# Patient Record
Sex: Female | Born: 1990 | Race: White | Hispanic: No | Marital: Married | Smoking: Former smoker
Health system: Southern US, Community
[De-identification: ages and names within clinical notes are randomized; demographics above are authoritative.]

## PROBLEM LIST (undated history)

## (undated) DIAGNOSIS — J309 Allergic rhinitis, unspecified: Secondary | ICD-10-CM

## (undated) DIAGNOSIS — M549 Dorsalgia, unspecified: Secondary | ICD-10-CM

## (undated) DIAGNOSIS — Q7292 Unspecified reduction defect of left lower limb: Secondary | ICD-10-CM

## (undated) DIAGNOSIS — F4312 Post-traumatic stress disorder, chronic: Secondary | ICD-10-CM

## (undated) DIAGNOSIS — B009 Herpesviral infection, unspecified: Secondary | ICD-10-CM

## (undated) HISTORY — DX: Allergic rhinitis, unspecified: J30.9

## (undated) HISTORY — DX: Post-traumatic stress disorder, chronic: F43.12

## (undated) HISTORY — PX: WISDOM TOOTH EXTRACTION: SHX21

## (undated) HISTORY — DX: Unspecified reduction defect of left lower limb: Q72.92

## (undated) HISTORY — DX: Herpesviral infection, unspecified: B00.9

## (undated) HISTORY — PX: NO PAST SURGERIES: SHX2092

## (undated) HISTORY — DX: Dorsalgia, unspecified: M54.9

---

## 2014-05-06 ENCOUNTER — Inpatient Hospital Stay: Payer: Self-pay | Admitting: Obstetrics and Gynecology

## 2014-05-06 LAB — CBC WITH DIFFERENTIAL/PLATELET
BASOS PCT: 0.1 %
Basophil #: 0 10*3/uL (ref 0.0–0.1)
EOS ABS: 0.1 10*3/uL (ref 0.0–0.7)
Eosinophil %: 0.6 %
HCT: 33.5 % — ABNORMAL LOW (ref 35.0–47.0)
HGB: 10.8 g/dL — ABNORMAL LOW (ref 12.0–16.0)
LYMPHS ABS: 2.2 10*3/uL (ref 1.0–3.6)
Lymphocyte %: 17.1 %
MCH: 28.7 pg (ref 26.0–34.0)
MCHC: 32.3 g/dL (ref 32.0–36.0)
MCV: 89 fL (ref 80–100)
MONO ABS: 0.7 x10 3/mm (ref 0.2–0.9)
MONOS PCT: 5.7 %
Neutrophil #: 9.7 10*3/uL — ABNORMAL HIGH (ref 1.4–6.5)
Neutrophil %: 76.5 %
PLATELETS: 171 10*3/uL (ref 150–440)
RBC: 3.77 10*6/uL — AB (ref 3.80–5.20)
RDW: 14.4 % (ref 11.5–14.5)
WBC: 12.6 10*3/uL — AB (ref 3.6–11.0)

## 2014-05-06 LAB — DRUG SCREEN, URINE

## 2014-05-06 LAB — GC/CHLAMYDIA PROBE AMP

## 2014-05-08 LAB — HEMATOCRIT: HCT: 26 % — ABNORMAL LOW (ref 35.0–47.0)

## 2014-06-22 ENCOUNTER — Ambulatory Visit: Payer: Self-pay | Admitting: Pain Medicine

## 2014-07-07 ENCOUNTER — Ambulatory Visit: Payer: Self-pay | Admitting: Pain Medicine

## 2014-08-01 ENCOUNTER — Ambulatory Visit: Payer: Self-pay | Admitting: Pain Medicine

## 2015-02-06 ENCOUNTER — Ambulatory Visit
Admit: 2015-02-06 | Disposition: A | Discharge status: Home / Self Care | Payer: Self-pay | Attending: Family Medicine | Admitting: Family Medicine

## 2015-02-14 DIAGNOSIS — J309 Allergic rhinitis, unspecified: Secondary | ICD-10-CM | POA: Insufficient documentation

## 2015-02-14 DIAGNOSIS — F4312 Post-traumatic stress disorder, chronic: Secondary | ICD-10-CM | POA: Insufficient documentation

## 2015-02-14 DIAGNOSIS — B009 Herpesviral infection, unspecified: Secondary | ICD-10-CM | POA: Insufficient documentation

## 2015-06-11 ENCOUNTER — Encounter: Payer: Self-pay | Admitting: Family Medicine

## 2015-06-11 ENCOUNTER — Ambulatory Visit (INDEPENDENT_AMBULATORY_CARE_PROVIDER_SITE_OTHER): Payer: BLUE CROSS/BLUE SHIELD | Admitting: Family Medicine

## 2015-06-11 VITALS — BP 112/78 | HR 63 | Temp 99.6°F | Wt 265.2 lb

## 2015-06-11 DIAGNOSIS — M545 Low back pain, unspecified: Secondary | ICD-10-CM | POA: Insufficient documentation

## 2015-06-11 DIAGNOSIS — F4312 Post-traumatic stress disorder, chronic: Secondary | ICD-10-CM

## 2015-06-11 DIAGNOSIS — M9903 Segmental and somatic dysfunction of lumbar region: Secondary | ICD-10-CM | POA: Diagnosis not present

## 2015-06-11 DIAGNOSIS — M9902 Segmental and somatic dysfunction of thoracic region: Secondary | ICD-10-CM | POA: Diagnosis not present

## 2015-06-11 DIAGNOSIS — M9905 Segmental and somatic dysfunction of pelvic region: Secondary | ICD-10-CM

## 2015-06-11 DIAGNOSIS — M9904 Segmental and somatic dysfunction of sacral region: Secondary | ICD-10-CM

## 2015-06-11 DIAGNOSIS — M9901 Segmental and somatic dysfunction of cervical region: Secondary | ICD-10-CM | POA: Diagnosis not present

## 2015-06-11 DIAGNOSIS — M2170 Unequal limb length (acquired), unspecified site: Secondary | ICD-10-CM | POA: Diagnosis not present

## 2015-06-11 MED ORDER — BUPROPION HCL ER (SR) 150 MG PO TB12: ORAL_TABLET | ORAL | Status: DC

## 2015-06-11 MED ORDER — CLONAZEPAM 0.5 MG PO TABS: 0.5000 mg | ORAL_TABLET | Freq: Every day | ORAL | Status: DC | PRN

## 2015-06-11 NOTE — Assessment & Plan Note (Signed)
Not doing well with heel cup. Will start heel lift. 3mm today. Will increase as needed for comfort and decreased pain.

## 2015-06-11 NOTE — Assessment & Plan Note (Signed)
Patient seems to have leg-length discrepancy that is bothering her again due to increased standing for work. Will start 3mm heel lift, which I think would help significantly. The rest of her pain seems to be due to functional and mechanical changes. I think that she would benefit from OMT. She was treated today as discussed below.

## 2015-06-11 NOTE — Assessment & Plan Note (Signed)
Doing better on the  of celexa, but still not stable. Will refill her klonapin. Will start her on wellbutrin for adjuvant treatment. Discussed risks and benefits. Follow up in 1 month. Continue celexa.

## 2015-06-11 NOTE — Patient Instructions (Signed)
Bupropion sustained-release tablets (Depression/Mood Disorders)  What is this medicine?  BUPROPION (byoo PROE pee on) is used to treat depression.  This medicine may be used for other purposes; ask your health care provider or pharmacist if you have questions.  COMMON BRAND NAME(S): Budeprion SR, Wellbutrin SR  What should I tell my health care provider before I take this medicine?  They need to know if you have any of these conditions:  -an eating disorder, such as anorexia or bulimia  -bipolar disorder or psychosis  -diabetes or high blood sugar, treated with medication  -glaucoma  -head injury or brain tumor  -heart disease, previous heart attack, or irregular heart beat  -high blood pressure  -kidney or liver disease  -seizures  -suicidal thoughts or a previous suicide attempt  -Tourette's syndrome  -weight loss  -an unusual or allergic reaction to bupropion, other medicines, foods, dyes, or preservatives  -breast-feeding  -pregnant or trying to become pregnant  How should I use this medicine?  Take this medicine by mouth with a glass of water. Follow the directions on the prescription label. You can take it with or without food. If it upsets your stomach, take it with food. Do not cut, crush or chew this medicine. Take your medicine at regular intervals. If you take this medicine more than once a day, take your second dose at least 8 hours after you take your first dose. To limit difficulty in sleeping, avoid taking this medicine at bedtime. Do not take your medicine more often than directed. Do not stop taking this medicine suddenly except upon the advice of your doctor. Stopping this medicine too quickly may cause serious side effects or your condition may worsen.  A special MedGuide will be given to you by the pharmacist with each prescription and refill. Be sure to read this information carefully each time.  Talk to your pediatrician regarding the use of this medicine in children. Special care may be  needed.  Overdosage: If you think you have taken too much of this medicine contact a poison control center or emergency room at once.  NOTE: This medicine is only for you. Do not share this medicine with others.  What if I miss a dose?  If you miss a dose, skip the missed dose and take your next tablet at the regular time. There should be at least 8 hours between doses. Do not take double or extra doses.  What may interact with this medicine?  Do not take this medicine with any of the following medications:  -linezolid  -MAOIs like Azilect, Carbex, Eldepryl, Marplan, Nardil, and Parnate  -methylene blue (injected into a vein)  -other medicines that contain bupropion like Zyban  This medicine may also interact with the following medications:  -alcohol  -certain medicines for anxiety or sleep  -certain medicines for blood pressure like metoprolol, propranolol  -certain medicines for depression or psychotic disturbances  -certain medicines for HIV or AIDS like efavirenz, lopinavir, nelfinavir, ritonavir  -certain medicines for irregular heart beat like propafenone, flecainide  -certain medicines for Parkinson's disease like amantadine, levodopa  -certain medicines for seizures like carbamazepine, phenytoin, phenobarbital  -cimetidine  -clopidogrel  -cyclophosphamide  -furazolidone  -isoniazid  -nicotine  -orphenadrine  -procarbazine  -steroid medicines like prednisone or cortisone  -stimulant medicines for attention disorders, weight loss, or to stay awake  -tamoxifen  -theophylline  -thiotepa  -ticlopidine  -tramadol  -warfarin  This list may not describe all possible interactions. Give your health care   provider a list of all the medicines, herbs, non-prescription drugs, or dietary supplements you use. Also tell them if you smoke, drink alcohol, or use illegal drugs. Some items may interact with your medicine.  What should I watch for while using this medicine?  Tell your doctor if your symptoms do not get better or  if they get worse. Visit your doctor or health care professional for regular checks on your progress. Because it may take several weeks to see the full effects of this medicine, it is important to continue your treatment as prescribed by your doctor.  Patients and their families should watch out for new or worsening thoughts of suicide or depression. Also watch out for sudden changes in feelings such as feeling anxious, agitated, panicky, irritable, hostile, aggressive, impulsive, severely restless, overly excited and hyperactive, or not being able to sleep. If this happens, especially at the beginning of treatment or after a change in dose, call your health care professional.  Avoid alcoholic drinks while taking this medicine. Drinking excessive alcoholic beverages, using sleeping or anxiety medicines, or quickly stopping the use of these agents while taking this medicine may increase your risk for a seizure.  Do not drive or use heavy machinery until you know how this medicine affects you. This medicine can impair your ability to perform these tasks.  Do not take this medicine close to bedtime. It may prevent you from sleeping.  Your mouth may get dry. Chewing sugarless gum or sucking hard candy, and drinking plenty of water may help. Contact your doctor if the problem does not go away or is severe.  What side effects may I notice from receiving this medicine?  Side effects that you should report to your doctor or health care professional as soon as possible:  -allergic reactions like skin rash, itching or hives, swelling of the face, lips, or tongue  -breathing problems  -changes in vision  -confusion  -fast or irregular heartbeat  -hallucinations  -increased blood pressure  -redness, blistering, peeling or loosening of the skin, including inside the mouth  -seizures  -suicidal thoughts or other mood changes  -unusually weak or tired  -vomiting  Side effects that usually do not require medical attention (report  to your doctor or health care professional if they continue or are bothersome):  -change in sex drive or performance  -constipation  -headache  -loss of appetite  -nausea  -tremors  -weight loss  This list may not describe all possible side effects. Call your doctor for medical advice about side effects. You may report side effects to FDA at 1-800-FDA-1088.  Where should I keep my medicine?  Keep out of the reach of children.  Store at room temperature between 20 and 25 degrees C (68 and 77 degrees F), away from direct sunlight and moisture. Keep tightly closed. Throw away any unused medicine after the expiration date.  NOTE: This sheet is a summary. It may not cover all possible information. If you have questions about this medicine, talk to your doctor, pharmacist, or health care provider.  © 2015, [employer]. (2013-05-06 12:41:10)

## 2015-06-11 NOTE — Progress Notes (Signed)
BP 112/78 mmHg  Pulse 63  Temp(Src) 99.6 F (37.6 C)  Wt 265 lb 3.2 oz (120.294 kg)  SpO2 98%   Subjective:    Patient ID: Alexa Diaz, female    DOB: 04/06/1991, 24 y.o.   MRN: [id number]  HPI: Alexa Diaz is a 24 y.o. female  Chief Complaint  Patient presents with  . Anxiety  . Back Pain   ANXIETY/STRESS- feels better on the  of celexa, but still not stable. Would like to se if there is something else we can do. Taking Klonopin very sparingly. Duration:stable Anxious mood: yes  Excessive worrying: yes Irritability: yes  Sweating: no Nausea: no Palpitations:yes Hyperventilation: no Panic attacks: yes Agoraphobia: no  Obscessions/compulsions: no Depressed mood: no GAD7: 9 Anhedonia: no Weight changes: no Insomnia: no   Hypersomnia: no Fatigue/loss of energy: no Feelings of worthlessness: no Feelings of guilt: no Impaired concentration/indecisiveness: yes Suicidal ideations: no   BACK PAIN- did well following her last appointment, but it has been 3-4 months. Feels like the heel cup isn't working as well any more. Lots of tenderness in her back. OMT helped previously and she would like to continue Duration: months Mechanism of injury: child birth Location: bilateral and low back Onset: sudden Severity: moderate Quality: dull, aching and pulling Frequency: intermittent Radiation: none Aggravating factors: lifting, movement, walking, bending and prolonged sitting Alleviating factors: rest, ice, heat, laying, NSAIDs and APAP Status: worse Treatments attempted: rest, ice, heat, APAP, ibuprofen, aleve, physical therapy, HEP and OMM  Relief with NSAIDs?: moderate Nighttime pain:  no Paresthesias / decreased sensation:  no Bowel / bladder incontinence:  no Fevers:  no Dysuria / urinary frequency:  no  Relevant past medical, surgical, family and social history reviewed and updated as indicated. Interim medical history since  our last visit reviewed. Allergies and medications reviewed and updated.  Review of Systems  Constitutional: Negative.   Respiratory: Negative.   Cardiovascular: Negative.   Gastrointestinal: Negative.   Musculoskeletal: Positive for back pain and gait problem. Negative for myalgias, joint swelling, arthralgias, neck pain and neck stiffness.  Psychiatric/Behavioral: Negative.    Per HPI unless specifically indicated above     Objective:    BP 112/78 mmHg  Pulse 63  Temp(Src) 99.6 F (37.6 C)  Wt 265 lb 3.2 oz (120.294 kg)  SpO2 98%  Wt Readings from Last 3 Encounters:  06/11/15 265 lb 3.2 oz (120.294 kg)  01/29/15 262 lb (118.842 kg)    Physical Exam  Constitutional: She is oriented to person, place, and time. She appears well-developed and well-nourished. No distress.  HENT:  Head: Normocephalic and atraumatic.  Right Ear: Hearing normal.  Left Ear: Hearing normal.  Nose: Nose normal.  Eyes: Conjunctivae and lids are normal. Right eye exhibits no discharge. Left eye exhibits no discharge. No scleral icterus.  Cardiovascular: Regular rhythm.   Pulmonary/Chest: Effort normal. No respiratory distress.  Abdominal: Soft. She exhibits no distension and no mass. There is no tenderness. There is no rebound and no guarding.  Musculoskeletal: She exhibits tenderness. She exhibits no edema.  Neurological: She is alert and oriented to person, place, and time.  Skin: Skin is warm, dry and intact. No rash noted. No erythema. No pallor.  Psychiatric: She has a normal mood and affect. Her speech is normal and behavior is normal. Judgment and thought content normal. Cognition and memory are normal.  Musculoskeletal:  Exam found Decreased ROM, Tissue texture changes, Tenderness to palpation and Asymmetry of  patient's  neck, thorax, lumbar, pelvis and sacrum, short L leg Osteopathic Structural Exam:   Neck: C4ESRR, C6ESRL  Thorax: T6-9SRRL, T5ESRR  Lumbar: QL hypertonic on the R,  L5ESRL  Pelvis: Posterior R innominate  Sacrum: R on R torsion, SI joint restricted on the R       Assessment & Plan:   Problem List Items Addressed This Visit      Other   Chronic post-traumatic stress disorder (PTSD)    Doing better on the 60mg  of celexa, but still not stable. Will refill her klonapin. Will start her on wellbutrin for adjuvant treatment. Discussed risks and benefits. Follow up in 1 month. Continue celexa.      Leg length discrepancy    Not doing well with heel cup. Will start heel lift. 3mm today. Will increase as needed for comfort and decreased pain.      Low back pain - Primary    Patient seems to have leg-length discrepancy that is bothering her again due to increased standing for work. Will start 3mm heel lift, which I think would help significantly. The rest of her pain seems to be due to functional and mechanical changes. I think that she would benefit from OMT. She was treated today as discussed below.       Other Visit Diagnoses    Somatic dysfunction of spine, cervical        Thoracic region somatic dysfunction        Lumbar region somatic dysfunction        Sacral region somatic dysfunction        Pelvic region somatic dysfunction          After verbal consent was obtained, patient was treated today with osteopathic manipulative medicine to the regions of the neck, thorax, lumbar, pelvis and sacrum using the techniques of myofascial release, counterstrain, muscle energy, HVLA and soft tissue. Areas of compensation relating to her primary pain source also treated. Patient tolerated the procedure well with good objective and good subjective improvement in symptoms.  She left the room in good condition. She was advised to stay well hydrated and that she may have some soreness following the procedure. If not improving or worsening, she will call and come in. She will return for reevaluation  In 3-4 weeks.   Follow up plan: Return in about 4 weeks (around  07/09/2015).

## 2015-07-10 ENCOUNTER — Encounter: Payer: Self-pay | Admitting: Family Medicine

## 2015-07-10 ENCOUNTER — Ambulatory Visit (INDEPENDENT_AMBULATORY_CARE_PROVIDER_SITE_OTHER): Payer: BLUE CROSS/BLUE SHIELD | Admitting: Family Medicine

## 2015-07-10 VITALS — BP 116/83 | HR 69 | Temp 99.3°F | Wt 262.0 lb

## 2015-07-10 DIAGNOSIS — M2170 Unequal limb length (acquired), unspecified site: Secondary | ICD-10-CM

## 2015-07-10 DIAGNOSIS — M545 Low back pain, unspecified: Secondary | ICD-10-CM

## 2015-07-10 DIAGNOSIS — L509 Urticaria, unspecified: Secondary | ICD-10-CM | POA: Insufficient documentation

## 2015-07-10 DIAGNOSIS — M9905 Segmental and somatic dysfunction of pelvic region: Secondary | ICD-10-CM

## 2015-07-10 DIAGNOSIS — M9902 Segmental and somatic dysfunction of thoracic region: Secondary | ICD-10-CM | POA: Diagnosis not present

## 2015-07-10 DIAGNOSIS — M9903 Segmental and somatic dysfunction of lumbar region: Secondary | ICD-10-CM

## 2015-07-10 DIAGNOSIS — M9901 Segmental and somatic dysfunction of cervical region: Secondary | ICD-10-CM

## 2015-07-10 DIAGNOSIS — M9904 Segmental and somatic dysfunction of sacral region: Secondary | ICD-10-CM

## 2015-07-10 MED ORDER — TRIAMCINOLONE ACETONIDE 0.1 % EX CREA: 1.0000 "application " | TOPICAL_CREAM | Freq: Two times a day (BID) | CUTANEOUS | Status: DC

## 2015-07-10 NOTE — Assessment & Plan Note (Signed)
Patient seems to have leg-length discrepancy that is bothering her again due to increased standing for work. Has been doing well with her 3mm heel lift, which we will continue. The rest of her pain seems to be due to functional and mechanical changes. I think that she would benefit from OMT. She was treated today as discussed below.

## 2015-07-10 NOTE — Progress Notes (Signed)
BP 116/83 mmHg  Pulse 69  Temp(Src) 99.3 F (37.4 C)  Wt 262 lb (118.842 kg)  SpO2 99%   Subjective:    Patient ID: Alexa Diaz, female    DOB: [birth year], 24 y.o.   MRN: [id number]  HPI: Alexa Diaz is a 24 y.o. female  Chief Complaint  Patient presents with  . Back Pain  . Rash    Patient woke up Sunday with a rash, it was very itchy, it was welps. She states that her hands swole up and were very painful, she was told by the pharmactist to use hydrocortisone cream and antifungal cream which helped with the itching, she was also taking benadryl. She denies eating or coming in contact with anything new. She still has a few places on her legs, she did have it under her arms, under her breast , neck, hands and legs   RASH- so much better now, just has some stuff on her thighs, but better now than she was Duration:  days  Location: generalized  Itching: yes Burning: yes Redness: yes Oozing: no Scaling: yes Blisters: no Painful: no Fevers: no Change in detergents/soaps/personal care products: no Recent illness: no Recent travel:no History of same: no Context: better Alleviating factors: hydrocortisone cream, benadryl and lotion/moisturizer Treatments attempted:hydrocortisone cream, benadryl, OTC anit-fungal and lotion/moisturizer Shortness of breath: no  Throat/tongue swelling: no Myalgias/arthralgias: no  BACK PAIN- does really well with OMT, felt well for about 3-4 weeks, but now is noticing that things are getting tight and irritated again.  Duration: months Mechanism of injury: unknown Location: low back Onset: gradual Severity: moderate Quality: aching Frequency: intermittent Radiation: buttocks Aggravating factors: lifting, movement, walking, laying and bending Alleviating factors: OMT, rest, ice, heat and laying Status: fluctuating Treatments attempted: rest, ice, heat, APAP, ibuprofen, aleve, physical therapy, HEP and OMM   Relief with NSAIDs?: yes Nighttime pain:  no Paresthesias / decreased sensation:  no Bowel / bladder incontinence:  no Fevers:  no Dysuria / urinary frequency:  no   Relevant past medical, surgical, family and social history reviewed and updated as indicated. Interim medical history since our last visit reviewed. Allergies and medications reviewed and updated.  Review of Systems  Constitutional: Negative.   Respiratory: Negative.   Cardiovascular: Negative.   Musculoskeletal: Positive for myalgias, back pain, arthralgias and gait problem. Negative for joint swelling, neck pain and neck stiffness.  Psychiatric/Behavioral: Negative.     Per HPI unless specifically indicated above     Objective:    BP 116/83 mmHg  Pulse 69  Temp(Src) 99.3 F (37.4 C)  Wt 262 lb (118.842 kg)  SpO2 99%  Wt Readings from Last 3 Encounters:  07/10/15 262 lb (118.842 kg)  06/11/15 265 lb 3.2 oz (120.294 kg)  01/29/15 262 lb (118.842 kg)    Physical Exam  Constitutional: She is oriented to person, place, and time. She appears well-developed and well-nourished. No distress.  HENT:  Head: Normocephalic and atraumatic.  Right Ear: Hearing normal.  Left Ear: Hearing normal.  Nose: Nose normal.  Eyes: Conjunctivae and lids are normal. Right eye exhibits no discharge. Left eye exhibits no discharge. No scleral icterus.  Cardiovascular: Normal rate and regular rhythm.   Pulmonary/Chest: Effort normal and breath sounds normal. No respiratory distress.  Musculoskeletal: Normal range of motion.  Neurological: She is alert and oriented to person, place, and time.  Skin: Skin is warm, dry and intact. No rash noted. No erythema. No pallor.  Psychiatric: She  has a normal mood and affect. Her speech is normal and behavior is normal. Judgment and thought content normal. Cognition and memory are normal.  Nursing note and vitals reviewed. Musculoskeletal:  Exam found Decreased ROM, Tissue texture changes,  Tenderness to palpation and Asymmetry of patient's  neck, thorax, lumbar, pelvis and sacrum Osteopathic Structural Exam:   Neck: C4-6ESRR, C3ESRL  Thorax: T3-5SRRL, T6-8SLRR  Lumbar: L3-5SRRL  Pelvis: Posterior L torsion  Sacrum: R on R torsion, SI joint restricted on the R   Results for orders placed or performed in visit on 05/06/14  GC/Chlamydia Probe Amp  Result Value Ref Range   Micro Text Report         SOURCE: 1    CHLAMYDIA                 CHLAMYDIA TRACHOMATIS NEGATIVE   N.GONORRHOEAE             N.GONORRHOEAE NEGATIVE   ANTIBIOTIC                                                      CBC with Differential/Platelet  Result Value Ref Range   WBC 12.6 (H) 3.6-11.0 x10 3/mm 3   RBC 3.77 (L) 3.80-5.20 X10 6/mm 3   HGB 10.8 (L) 12.0-16.0 g/dL   HCT 16.1 (L) 09.6-04.5 %   MCV 89 80-100 fL   MCH 28.7 26.0-34.0 pg   MCHC 32.3 32.0-36.0 g/dL   RDW 40.9 81.1-91.4 %   Platelet 171 150-440 x10 3/mm 3   Neutrophil % 76.5 %   Lymphocyte % 17.1 %   Monocyte % 5.7 %   Eosinophil % 0.6 %   Basophil % 0.1 %   Neutrophil # 9.7 (H) 1.4-6.5 x10 3/mm 3   Lymphocyte # 2.2 1.0-3.6 x10 3/mm 3   Monocyte # 0.7 0.2-0.9 x10 3/mm    Eosinophil # 0.1 0.0-0.7 x10 3/mm 3   Basophil # 0.0 0.0-0.1 x10 3/mm 3  Drug Screen, Urine  Result Value Ref Range   Tricyclic, Ur Screen NEGATIVE Cutoff-1000 ng/mL   Amphetamines, Ur Screen NEGATIVE Cutoff-1000 ng/mL   MDMA (Ecstasy)Ur Screen NEGATIVE Cutoff-500 ng/mL   Cocaine Metabolite,Ur [place] NEGATIVE Cutoff-300 ng/mL   Opiate, Ur Screen NEGATIVE Cutoff-300 ng/mL   Phencyclidine (PCP) Ur S NEGATIVE Cutoff-25 ng/mL   Cannabinoid 50 Ng, Ur [place] NEGATIVE Cutoff-50 ng/mL   Barbiturates, Ur Screen NEGATIVE Cutoff-200 ng/mL   Methadone, Ur Screen NEGATIVE Cutoff-300 ng/mL   Benzodiazepine, Ur Scrn NEGATIVE Cutoff-200 ng/mL  Hematocrit  Result Value Ref Range   HCT 26.0 (L) 35.0-47.0 %      Assessment & Plan:   Problem List Items Addressed This Visit       Musculoskeletal and Integument   Urticaria     Other   Leg length discrepancy    Not doing well with heel cup. Will start heel lift. 3mm today. Will increase as needed for comfort and decreased pain.        Low back pain - Primary    Patient seems to have leg-length discrepancy that is bothering her again due to increased standing for work. Has been doing well with her 3mm heel lift, which we will continue. The rest of her pain seems to be due to functional and mechanical changes. I think that she would benefit from  OMT. She was treated today as discussed below.         Other Visit Diagnoses    Somatic dysfunction of spine, cervical        Thoracic region somatic dysfunction        Lumbar region somatic dysfunction        Sacral region somatic dysfunction        Pelvic region somatic dysfunction          After verbal consent was obtained, patient was treated today with osteopathic manipulative medicine to the regions of the neck, thorax, lumbar, pelvis and sacrum using the techniques of Still, muscle energy, HVLA and soft tissue. Areas of compensation relating to her primary pain source also treated. Patient tolerated the procedure well with good objective and good subjective improvement in symptoms.  She left the room in good condition. She was advised to stay well hydrated and that she may have some soreness following the procedure. If not improving or worsening, she will call and come in. She will come in  in 1-2 months if needed.   Follow up plan: Return in about 4 weeks (around 08/07/2015).

## 2015-07-10 NOTE — Assessment & Plan Note (Signed)
Not doing well with heel cup. Will start heel lift. 3mm today. Will increase as needed for comfort and decreased pain.

## 2015-08-07 ENCOUNTER — Encounter: Payer: Self-pay | Admitting: Family Medicine

## 2015-08-07 ENCOUNTER — Ambulatory Visit (INDEPENDENT_AMBULATORY_CARE_PROVIDER_SITE_OTHER): Payer: BLUE CROSS/BLUE SHIELD | Admitting: Family Medicine

## 2015-08-07 VITALS — BP 104/73 | HR 81 | Temp 99.1°F | Wt 267.0 lb

## 2015-08-07 DIAGNOSIS — M9902 Segmental and somatic dysfunction of thoracic region: Secondary | ICD-10-CM

## 2015-08-07 DIAGNOSIS — M9904 Segmental and somatic dysfunction of sacral region: Secondary | ICD-10-CM

## 2015-08-07 DIAGNOSIS — M9905 Segmental and somatic dysfunction of pelvic region: Secondary | ICD-10-CM | POA: Diagnosis not present

## 2015-08-07 DIAGNOSIS — M9903 Segmental and somatic dysfunction of lumbar region: Secondary | ICD-10-CM

## 2015-08-07 DIAGNOSIS — M545 Low back pain, unspecified: Secondary | ICD-10-CM

## 2015-08-07 DIAGNOSIS — M9901 Segmental and somatic dysfunction of cervical region: Secondary | ICD-10-CM

## 2015-08-07 DIAGNOSIS — M2170 Unequal limb length (acquired), unspecified site: Secondary | ICD-10-CM

## 2015-08-07 NOTE — Assessment & Plan Note (Addendum)
In acute exacerbation today due to dancing at a concert. This seems to be myofascial and structural in nature and acute. I think she would benefit from OMT. Treated today as discussed below.

## 2015-08-07 NOTE — Progress Notes (Signed)
BP 104/73 mmHg  Pulse 81  Temp(Src) 99.1 F (37.3 C)  Wt 267 lb (121.11 kg)  SpO2 98%   Subjective:    Patient ID: Alexa Diaz, female    DOB: Feb 24, 1991, 24 y.o.   MRN: [id number]  HPI: Alexa Diaz is a 24 y.o. female  Chief Complaint  Patient presents with  . Tailbone Pain    OMM   Having pain in her back today. She has gotten used to her heel lift and is feeling better with it now and feeling like it has been helping with her back. She was feeling well with her back until about 2 weeks ago when she went to a concert and was dancing. At that time, she started with a lot of pain in her mid back. She notes that it is in the middle of her back, no radiation. Sharp and aching. No other signs or symptoms today. Otherwise feeling well with no other concerns or complaints at this time  Relevant past medical, surgical, family and social history reviewed and updated as indicated. Interim medical history since our last visit reviewed. Allergies and medications reviewed and updated.  Review of Systems  Constitutional: Negative.   Respiratory: Negative.   Cardiovascular: Negative.   Musculoskeletal: Positive for myalgias and back pain. Negative for joint swelling, arthralgias, gait problem, neck pain and neck stiffness.  Psychiatric/Behavioral: Negative.     Per HPI unless specifically indicated above     Objective:    BP 104/73 mmHg  Pulse 81  Temp(Src) 99.1 F (37.3 C)  Wt 267 lb (121.11 kg)  SpO2 98%  Wt Readings from Last 3 Encounters:  08/07/15 267 lb (121.11 kg)  07/10/15 262 lb (118.842 kg)  06/11/15 265 lb 3.2 oz (120.294 kg)    Physical Exam  Constitutional: She is oriented to person, place, and time. She appears well-developed and well-nourished. No distress.  HENT:  Head: Normocephalic and atraumatic.  Right Ear: Hearing normal.  Left Ear: Hearing normal.  Nose: Nose normal.  Eyes: Conjunctivae and lids are normal. Right eye exhibits no  discharge. Left eye exhibits no discharge. No scleral icterus.  Pulmonary/Chest: Effort normal. No respiratory distress.  Abdominal: Soft. She exhibits no distension and no mass. There is no tenderness. There is no rebound and no guarding.  Neurological: She is alert and oriented to person, place, and time.  Skin: Skin is warm, dry and intact. No rash noted. No erythema. No pallor.  Psychiatric: She has a normal mood and affect. Her speech is normal and behavior is normal. Judgment and thought content normal. Cognition and memory are normal.  Nursing note and vitals reviewed. Musculoskeletal:  Exam found Decreased ROM, Tissue texture changes, Tenderness to palpation and Asymmetry of patient's  neck, thorax, lumbar, pelvis and sacrum Osteopathic Structural Exam:   Neck: C4ESRR, SCM hypertonic on the R  Thorax: T10ESRR, trap spasm on the R  Lumbar: QL hypertonic on the R, L3-5SLRR  Pelvis: Posterior R innomiante  Sacrum: R on R torsion     Assessment & Plan:   Problem List Items Addressed This Visit      Other   Leg length discrepancy    Patient seems to have leg-length discrepancy that is doing very well with her heel lift. Has been doing well with her 3mm heel lift, which we will continue. The rest of her pain seems to be due to functional and mechanical changes. I think that she would benefit from OMT. She was treated  today as discussed below.       Low back pain - Primary    In acute exacerbation today due to dancing at a concert. This seems to be myofascial and structural in nature and acute. I think she would benefit from OMT. Treated today as discussed below.           Other Visit Diagnoses    Thoracic region somatic dysfunction        Sacral region somatic dysfunction        Somatic dysfunction of spine, cervical        Lumbar region somatic dysfunction        Pelvic region somatic dysfunction          After verbal consent was obtained, patient was treated today with  osteopathic manipulative medicine to the regions of the neck, thorax, lumbar, pelvis and sacrum using the techniques of myofascial release, counterstrain, muscle energy and HVLA. Areas of compensation relating to her primary pain source also treated. Patient tolerated the procedure well with good objective and good subjective improvement in symptoms. She left the room in good condition. She was advised to stay well hydrated and that she may have some soreness following the procedure. If not improving or worsening, she will call and come in. She will return for reevaluation  in 1-2 months.   Follow up plan: Return in about 4 weeks (around 09/04/2015).

## 2015-08-07 NOTE — Assessment & Plan Note (Signed)
Patient seems to have leg-length discrepancy that is doing very well with her heel lift. Has been doing well with her 3mm heel lift, which we will continue. The rest of her pain seems to be due to functional and mechanical changes. I think that she would benefit from OMT. She was treated today as discussed below.

## 2015-08-13 ENCOUNTER — Ambulatory Visit (INDEPENDENT_AMBULATORY_CARE_PROVIDER_SITE_OTHER): Payer: BLUE CROSS/BLUE SHIELD | Admitting: Family Medicine

## 2015-08-13 ENCOUNTER — Encounter: Payer: Self-pay | Admitting: Family Medicine

## 2015-08-13 VITALS — BP 120/88 | HR 73 | Temp 99.1°F | Wt 264.0 lb

## 2015-08-13 DIAGNOSIS — J209 Acute bronchitis, unspecified: Secondary | ICD-10-CM

## 2015-08-13 DIAGNOSIS — F4312 Post-traumatic stress disorder, chronic: Secondary | ICD-10-CM

## 2015-08-13 MED ORDER — ALBUTEROL SULFATE (2.5 MG/3ML) 0.083% IN NEBU: 2.5000 mg | INHALATION_SOLUTION | Freq: Four times a day (QID) | RESPIRATORY_TRACT | Status: DC | PRN

## 2015-08-13 MED ORDER — CLONAZEPAM 0.5 MG PO TABS: 0.5000 mg | ORAL_TABLET | Freq: Every day | ORAL | Status: DC | PRN

## 2015-08-13 MED ORDER — AZITHROMYCIN 250 MG PO TABS: ORAL_TABLET | ORAL | Status: DC

## 2015-08-13 MED ORDER — PREDNISONE 10 MG PO TABS: ORAL_TABLET | ORAL | Status: DC

## 2015-08-13 MED ORDER — HYDROCOD POLST-CPM POLST ER 10-8 MG/5ML PO SUER: 5.0000 mL | Freq: Every evening | ORAL | Status: DC | PRN

## 2015-08-13 NOTE — Assessment & Plan Note (Signed)
Not doing well at all. Very upset and crying. Rx for klonapin being used appropriately and CSA has been signed previously. Rx refilled today. Encouraged her to reach out to family and to take time away from her husband. She will call if not getting better or getting worse.

## 2015-08-13 NOTE — Patient Instructions (Signed)
Acute Bronchitis Bronchitis is when the airways that extend from the windpipe into the lungs get red, puffy, and painful (inflamed). Bronchitis often causes thick spit (mucus) to develop. This leads to a cough. A cough is the most common symptom of bronchitis. In acute bronchitis, the condition usually begins suddenly and goes away over time (usually in 2 weeks). Smoking, allergies, and asthma can make bronchitis worse. Repeated episodes of bronchitis may cause more lung problems. HOME CARE  Rest.  Drink enough fluids to keep your pee (urine) clear or pale yellow (unless you need to limit fluids as told by your doctor).  Only take over-the-counter or prescription medicines as told by your doctor.  Avoid smoking and secondhand smoke. These can make bronchitis worse. If you are a smoker, think about using nicotine gum or skin patches. Quitting smoking will help your lungs heal faster.  Reduce the chance of getting bronchitis again by:  Washing your hands often.  Avoiding people with cold symptoms.  Trying not to touch your hands to your mouth, nose, or eyes.  Follow up with your doctor as told. GET HELP IF: Your symptoms do not improve after 1 week of treatment. Symptoms include:  Cough.  Fever.  Coughing up thick spit.  Body aches.  Chest congestion.  Chills.  Shortness of breath.  Sore throat. GET HELP RIGHT AWAY IF:   You have an increased fever.  You have chills.  You have severe shortness of breath.  You have bloody thick spit (sputum).  You throw up (vomit) often.  You lose too much body fluid (dehydration).  You have a severe headache.  You faint. MAKE SURE YOU:   Understand these instructions.  Will watch your condition.  Will get help right away if you are not doing well or get worse.   This information is not intended to replace advice given to you by your health care provider. Make sure you discuss any questions you have with your health care  provider.   Document Released: 03/31/2008 Document Revised: 06/15/2013 Document Reviewed: 04/05/2013 Elsevier Interactive Patient Education 2016 [place] and Stress Management Stress is a normal reaction to life events. It is what you feel when life demands more than you are used to or more than you can handle. Some stress can be useful. For example, the stress reaction can help you catch the last bus of the day, study for a test, or meet a deadline at work. But stress that occurs too often or for too long can cause problems. It can affect your emotional health and interfere with relationships and normal daily activities. Too much stress can weaken your immune system and increase your risk for physical illness. If you already have a medical problem, stress can make it worse. CAUSES  All sorts of life events may cause stress. An event that causes stress for one person may not be stressful for another person. Major life events commonly cause stress. These may be positive or negative. Examples include losing your job, moving into a new home, getting married, having a baby, or losing a loved one. Less obvious life events may also cause stress, especially if they occur day after day or in combination. Examples include working long hours, driving in traffic, caring for children, being in debt, or being in a difficult relationship. SIGNS AND SYMPTOMS Stress may cause emotional symptoms including, the following:  Anxiety. This is feeling worried, afraid, on edge, overwhelmed, or out of control.  Anger. This is  feeling irritated or impatient.  Depression. This is feeling sad, down, helpless, or guilty.  Difficulty focusing, remembering, or making decisions. Stress may cause physical symptoms, including the following:   Aches and pains. These may affect your head, neck, back, stomach, or other areas of your body.  Tight muscles or clenched jaw.  Low energy or trouble sleeping. Stress may  cause unhealthy behaviors, including the following:   Eating to feel better (overeating) or skipping meals.  Sleeping too little, too much, or both.  Working too much or putting off tasks (procrastination).  Smoking, drinking alcohol, or using drugs to feel better. DIAGNOSIS  Stress is diagnosed through an assessment by your health care provider. Your health care provider will ask questions about your symptoms and any stressful life events.Your health care provider will also ask about your medical history and may order blood tests or other tests. Certain medical conditions and medicine can cause physical symptoms similar to stress. Mental illness can cause emotional symptoms and unhealthy behaviors similar to stress. Your health care provider may refer you to a mental health professional for further evaluation.  TREATMENT  Stress management is the recommended treatment for stress.The goals of stress management are reducing stressful life events and coping with stress in healthy ways.  Techniques for reducing stressful life events include the following:  Stress identification. Self-monitor for stress and identify what causes stress for you. These skills may help you to avoid some stressful events.  Time management. Set your priorities, keep a calendar of events, and learn to say "no." These tools can help you avoid making too many commitments. Techniques for coping with stress include the following:  Rethinking the problem. Try to think realistically about stressful events rather than ignoring them or overreacting. Try to find the positives in a stressful situation rather than focusing on the negatives.  Exercise. Physical exercise can release both physical and emotional tension. The key is to find a form of exercise you enjoy and do it regularly.  Relaxation techniques. These relax the body and mind. Examples include yoga, meditation, tai chi, biofeedback, deep breathing, progressive  muscle relaxation, listening to music, being out in nature, journaling, and other hobbies. Again, the key is to find one or more that you enjoy and can do regularly.  Healthy lifestyle. Eat a balanced diet, get plenty of sleep, and do not smoke. Avoid using alcohol or drugs to relax.  Strong support network. Spend time with family, friends, or other people you enjoy being around.Express your feelings and talk things over with someone you trust. Counseling or talktherapy with a mental health professional may be helpful if you are having difficulty managing stress on your own. Medicine is typically not recommended for the treatment of stress.Talk to your health care provider if you think you need medicine for symptoms of stress. HOME CARE INSTRUCTIONS  Keep all follow-up visits as directed by your health care provider.  Take all medicines as directed by your health care provider. SEEK MEDICAL CARE IF:  Your symptoms get worse or you start having new symptoms.  You feel overwhelmed by your problems and can no longer manage them on your own. SEEK IMMEDIATE MEDICAL CARE IF:  You feel like hurting yourself or someone else.   This information is not intended to replace advice given to you by your health care provider. Make sure you discuss any questions you have with your health care provider.   Document Released: 04/08/2001 Document Revised: 11/03/2014 Document Reviewed: 06/07/2013 Elsevier  Interactive Patient Education [employer].
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
## 2015-08-16 ENCOUNTER — Other Ambulatory Visit: Payer: Self-pay | Admitting: Family Medicine

## 2015-08-16 DIAGNOSIS — J209 Acute bronchitis, unspecified: Secondary | ICD-10-CM

## 2015-08-16 MED ORDER — ALBUTEROL SULFATE HFA 108 (90 BASE) MCG/ACT IN AERS: 2.0000 | INHALATION_SPRAY | Freq: Four times a day (QID) | RESPIRATORY_TRACT | Status: DC | PRN

## 2015-08-27 ENCOUNTER — Ambulatory Visit: Payer: BLUE CROSS/BLUE SHIELD | Admitting: Family Medicine

## 2015-09-06 ENCOUNTER — Encounter: Payer: Self-pay | Admitting: Family Medicine

## 2015-09-06 ENCOUNTER — Ambulatory Visit (INDEPENDENT_AMBULATORY_CARE_PROVIDER_SITE_OTHER): Payer: BLUE CROSS/BLUE SHIELD | Admitting: Family Medicine

## 2015-09-06 VITALS — BP 127/76 | HR 76 | Temp 98.6°F | Wt 270.0 lb

## 2015-09-06 DIAGNOSIS — M545 Low back pain, unspecified: Secondary | ICD-10-CM

## 2015-09-06 DIAGNOSIS — M9903 Segmental and somatic dysfunction of lumbar region: Secondary | ICD-10-CM

## 2015-09-06 DIAGNOSIS — M9901 Segmental and somatic dysfunction of cervical region: Secondary | ICD-10-CM

## 2015-09-06 DIAGNOSIS — M9905 Segmental and somatic dysfunction of pelvic region: Secondary | ICD-10-CM | POA: Diagnosis not present

## 2015-09-06 DIAGNOSIS — M9904 Segmental and somatic dysfunction of sacral region: Secondary | ICD-10-CM | POA: Diagnosis not present

## 2015-09-06 DIAGNOSIS — M9902 Segmental and somatic dysfunction of thoracic region: Secondary | ICD-10-CM

## 2015-09-06 DIAGNOSIS — M9909 Segmental and somatic dysfunction of abdomen and other regions: Secondary | ICD-10-CM

## 2015-09-06 NOTE — Assessment & Plan Note (Signed)
In acute exacerbation today due to stress and likely due to holding her daughter always on that hip. This seems to be myofascial and structural in nature and acute. She does have some somatic dysfunction that I think would benefit from OMT. Treated today as discussed below.

## 2015-09-06 NOTE — Patient Instructions (Signed)

## 2015-09-06 NOTE — Progress Notes (Signed)
BP 127/76 mmHg  Pulse 76  Temp(Src) 98.6 F (37 C)  Wt 270 lb (122.471 kg)  SpO2 98%   Subjective:    Patient ID: Alexa Diaz, female    DOB: 11/20/1990, 24 y.o.   MRN: [id number]  HPI: Alexa Diaz is a 24 y.o. female  Chief Complaint  Patient presents with  . Back Pain   BACK PAIN- Consetta notes that her back is acting up again. She has been using her heel lift and it's been helping, but she's had a bad spasm in her low back on the R side for about a week now. She finds OMT really helpful and would like to try it again today.  Duration: chronic, but in exacerbation for about the last week or so Mechanism of injury: no trauma Location: Right and low back Onset: gradual Severity: moderate Quality: dull, aching and pulling Frequency: constant Radiation: buttocks Aggravating factors: lifting, movement, walking, laying and bending Alleviating factors: rest, ice, heat, laying and NSAIDs OMM Status: worse Treatments attempted: rest, ice, heat, APAP, ibuprofen, aleve, HEP and OMM  Relief with NSAIDs?: moderate Nighttime pain:  no Paresthesias / decreased sensation:  no Bowel / bladder incontinence:  no Fevers:  no Dysuria / urinary frequency:  no   Relevant past medical, surgical, family and social history reviewed and updated as indicated. Interim medical history since our last visit reviewed. Allergies and medications reviewed and updated.  Review of Systems  Constitutional: Negative.   Respiratory: Negative.   Cardiovascular: Negative.   Gastrointestinal: Negative.   Musculoskeletal: Positive for myalgias and back pain. Negative for joint swelling, arthralgias, gait problem and neck pain.  Neurological: Negative.   Psychiatric/Behavioral: Negative.     Per HPI unless specifically indicated above     Objective:    BP 127/76 mmHg  Pulse 76  Temp(Src) 98.6 F (37 C)  Wt 270 lb (122.471 kg)  SpO2 98%  Wt Readings from Last 3 Encounters:   09/06/15 270 lb (122.471 kg)  08/13/15 264 lb (119.75 kg)  08/07/15 267 lb (121.11 kg)    Physical Exam  Constitutional: She is oriented to person, place, and time. She appears well-developed and well-nourished. No distress.  HENT:  Head: Normocephalic and atraumatic.  Right Ear: Hearing normal.  Left Ear: Hearing normal.  Nose: Nose normal.  Eyes: Conjunctivae and lids are normal. Right eye exhibits no discharge. Left eye exhibits no discharge. No scleral icterus.  Pulmonary/Chest: Effort normal. No respiratory distress.  Abdominal: Soft. She exhibits no distension and no mass. There is no tenderness. There is no rebound and no guarding.  Neurological: She is alert and oriented to person, place, and time.  Skin: Skin is warm, dry and intact. No rash noted. No erythema. No pallor.  Psychiatric: She has a normal mood and affect. Her speech is normal and behavior is normal. Judgment and thought content normal. Cognition and memory are normal.  Nursing note and vitals reviewed. Musculoskeletal:  Exam found Decreased ROM, Tissue texture changes, Tenderness to palpation and Asymmetry of patient's  neck, thorax, lumbar, pelvis, sacrum and abdomen Osteopathic Structural Exam:   Neck: C3-4ESRR, C5-7ESRL  Thorax: T4ESRR, T5-9SRRL, T10FSRR  Lumbar: QL hypertonic on the R, L4-5SLRR  Pelvis: Anterior R innominate  Sacrum: R on R torsion  Abdomen: diaphragm spasm on the R   Results for orders placed or performed in visit on 05/06/14  GC/Chlamydia Probe Amp  Result Value Ref Range   Micro Text Report  SOURCE: 1    CHLAMYDIA                 CHLAMYDIA TRACHOMATIS NEGATIVE   N.GONORRHOEAE             N.GONORRHOEAE NEGATIVE   ANTIBIOTIC                                                      CBC with Differential/Platelet  Result Value Ref Range   WBC 12.6 (H) 3.6-11.0 x10 3/mm 3   RBC 3.77 (L) 3.80-5.20 X10 6/mm 3   HGB 10.8 (L) 12.0-16.0 g/dL   HCT 40.9 (L) 81.1-91.4 %   MCV 89  80-100 fL   MCH 28.7 26.0-34.0 pg   MCHC 32.3 32.0-36.0 g/dL   RDW 78.2 95.6-21.3 %   Platelet 171 150-440 x10 3/mm 3   Neutrophil % 76.5 %   Lymphocyte % 17.1 %   Monocyte % 5.7 %   Eosinophil % 0.6 %   Basophil % 0.1 %   Neutrophil # 9.7 (H) 1.4-6.5 x10 3/mm 3   Lymphocyte # 2.2 1.0-3.6 x10 3/mm 3   Monocyte # 0.7 0.2-0.9 x10 3/mm    Eosinophil # 0.1 0.0-0.7 x10 3/mm 3   Basophil # 0.0 0.0-0.1 x10 3/mm 3  Drug Screen, Urine  Result Value Ref Range   Tricyclic, Ur Screen NEGATIVE Cutoff-1000 ng/mL   Amphetamines, Ur Screen NEGATIVE Cutoff-1000 ng/mL   MDMA (Ecstasy)Ur Screen NEGATIVE Cutoff-500 ng/mL   Cocaine Metabolite,Ur [place] NEGATIVE Cutoff-300 ng/mL   Opiate, Ur Screen NEGATIVE Cutoff-300 ng/mL   Phencyclidine (PCP) Ur S NEGATIVE Cutoff-25 ng/mL   Cannabinoid 50 Ng, Ur [place] NEGATIVE Cutoff-50 ng/mL   Barbiturates, Ur Screen NEGATIVE Cutoff-200 ng/mL   Methadone, Ur Screen NEGATIVE Cutoff-300 ng/mL   Benzodiazepine, Ur Scrn NEGATIVE Cutoff-200 ng/mL  Hematocrit  Result Value Ref Range   HCT 26.0 (L) 35.0-47.0 %      Assessment & Plan:   Problem List Items Addressed This Visit      Other   Low back pain - Primary    In acute exacerbation today due to stress and likely due to holding her daughter always on that hip. This seems to be myofascial and structural in nature and acute. She does have some somatic dysfunction that I think would benefit from OMT. Treated today as discussed below.             Other Visit Diagnoses    Thoracic region somatic dysfunction        Sacral region somatic dysfunction        Somatic dysfunction of spine, cervical        Lumbar region somatic dysfunction        Pelvic region somatic dysfunction        Somatic dysfunction of abdominal region          After verbal consent was obtained, patient was treated today with osteopathic manipulative medicine to the regions of the neck, thorax, lumbar, pelvis, sacrum and abdomen using the  techniques of myofascial release, counterstrain, muscle energy and HVLA. Areas of compensation relating to her primary pain source also treated. Patient tolerated the procedure well with good objective and good subjective improvement in symptoms. She left the room in good condition. She was advised to stay well hydrated and that she may  have some soreness following the procedure. If not improving or worsening, she will call and come in. She will return for reevaluation  on a PRN basis.   Follow up plan: Return if symptoms worsen or fail to improve.

## 2015-10-01 ENCOUNTER — Ambulatory Visit (INDEPENDENT_AMBULATORY_CARE_PROVIDER_SITE_OTHER): Payer: BLUE CROSS/BLUE SHIELD | Admitting: Family Medicine

## 2015-10-01 ENCOUNTER — Encounter: Payer: Self-pay | Admitting: Family Medicine

## 2015-10-01 VITALS — BP 109/75 | HR 71 | Temp 98.4°F | Wt 272.0 lb

## 2015-10-01 DIAGNOSIS — M9904 Segmental and somatic dysfunction of sacral region: Secondary | ICD-10-CM | POA: Diagnosis not present

## 2015-10-01 DIAGNOSIS — F4312 Post-traumatic stress disorder, chronic: Secondary | ICD-10-CM

## 2015-10-01 DIAGNOSIS — M9903 Segmental and somatic dysfunction of lumbar region: Secondary | ICD-10-CM

## 2015-10-01 DIAGNOSIS — M545 Low back pain, unspecified: Secondary | ICD-10-CM

## 2015-10-01 DIAGNOSIS — M9901 Segmental and somatic dysfunction of cervical region: Secondary | ICD-10-CM

## 2015-10-01 DIAGNOSIS — M25562 Pain in left knee: Secondary | ICD-10-CM | POA: Diagnosis not present

## 2015-10-01 DIAGNOSIS — M9905 Segmental and somatic dysfunction of pelvic region: Secondary | ICD-10-CM

## 2015-10-01 DIAGNOSIS — M9900 Segmental and somatic dysfunction of head region: Secondary | ICD-10-CM | POA: Diagnosis not present

## 2015-10-01 DIAGNOSIS — M9902 Segmental and somatic dysfunction of thoracic region: Secondary | ICD-10-CM | POA: Diagnosis not present

## 2015-10-01 MED ORDER — CLONAZEPAM 1 MG PO TABS: 1.0000 mg | ORAL_TABLET | Freq: Every day | ORAL | Status: DC | PRN

## 2015-10-01 NOTE — Assessment & Plan Note (Signed)
In exacerbation due to grandfather's death and funeral. Will refill her klonapin. Hasn't had refill in 2 months. Discussed warning signs that she is becoming tolerant to it. Call with any concerns or complaints.

## 2015-10-01 NOTE — Assessment & Plan Note (Signed)
In acute exacerbation again today due to stress and likely due to holding her daughter always on that hip. This seems to be myofascial and structural in nature and acute. She does have some somatic dysfunction that I think would benefit from OMT. Treated today as discussed below.
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
## 2015-10-09 ENCOUNTER — Ambulatory Visit: Payer: BLUE CROSS/BLUE SHIELD | Admitting: Family Medicine

## 2015-11-01 ENCOUNTER — Ambulatory Visit
Admission: EM | Admit: 2015-11-01 | Discharge: 2015-11-01 | Disposition: A | Discharge status: Home / Self Care | Payer: Self-pay | Attending: Family Medicine | Admitting: Family Medicine

## 2015-11-01 DIAGNOSIS — J209 Acute bronchitis, unspecified: Secondary | ICD-10-CM

## 2015-11-01 DIAGNOSIS — J40 Bronchitis, not specified as acute or chronic: Secondary | ICD-10-CM

## 2015-11-01 MED ORDER — ALBUTEROL SULFATE HFA 108 (90 BASE) MCG/ACT IN AERS: 2.0000 | INHALATION_SPRAY | RESPIRATORY_TRACT | Status: DC | PRN

## 2015-11-01 MED ORDER — HYDROCOD POLST-CPM POLST ER 10-8 MG/5ML PO SUER
5.0000 mL | Freq: Two times a day (BID) | ORAL | Status: DC | PRN
Start: 2015-11-01 — End: 2016-02-28

## 2015-11-01 MED ORDER — PREDNISONE 10 MG (21) PO TBPK: ORAL_TABLET | ORAL | Status: DC

## 2015-11-01 MED ORDER — IPRATROPIUM-ALBUTEROL 0.5-2.5 (3) MG/3ML IN SOLN
3.0000 mL | Freq: Once | RESPIRATORY_TRACT | Status: AC
  Administered 2015-11-01: 3 mL via RESPIRATORY_TRACT

## 2015-11-01 MED ORDER — AZITHROMYCIN 500 MG PO TABS: ORAL_TABLET | ORAL | Status: DC

## 2015-11-01 NOTE — ED Notes (Signed)
Fever started last night.  [place]  Around 26th reports drainage.  Daughter has PNA. Other multiple sick contacts.  Pt had bronchitis in October.   Also reports rattling in chest.

## 2015-11-01 NOTE — ED Notes (Signed)
Non-toxic appearing pt.   Has cough.  Non productive at this time.  No fever during triage.

## 2015-11-01 NOTE — Discharge Instructions (Signed)
How to Use an Inhaler °Using your inhaler correctly is very important. Good technique will make sure that the medicine reaches your lungs.  °HOW TO USE AN INHALER: °· Take the cap off the inhaler. °· If this is the first time using your inhaler, you need to prime it. Shake the inhaler for 5 seconds. Release four puffs into the air, away from your face. Ask your doctor for help if you have questions. °· Shake the inhaler for 5 seconds. °· Turn the inhaler so the bottle is above the mouthpiece. °· Put your pointer finger on top of the bottle. Your thumb holds the bottom of the inhaler. °· Open your mouth. °· Either hold the inhaler away from your mouth (the width of 2 fingers) or place your lips tightly around the mouthpiece. Ask your doctor which way to use your inhaler. °· Breathe out as much air as possible. °· Breathe in and push down on the bottle 1 time to release the medicine. You will feel the medicine go in your mouth and throat. °· Continue to take a deep breath in very slowly. Try to fill your lungs. °· After you have breathed in completely, hold your breath for 10 seconds. This will help the medicine to settle in your lungs. If you cannot hold your breath for 10 seconds, hold it for as long as you can before you breathe out. °· Breathe out slowly, through pursed lips. Whistling is an example of pursed lips. °· If your doctor has told you to take more than 1 puff, wait at least 15-30 seconds between puffs. This will help you get the best results from your medicine. Do not use the inhaler more than your doctor tells you to. °· Put the cap back on the inhaler. °· Follow the directions from your doctor or from the inhaler package about cleaning the inhaler. °If you use more than one inhaler, ask your doctor which inhalers to use and what order to use them in. Ask your doctor to help you figure out when you will need to refill your inhaler.  °If you use a steroid inhaler, always rinse your mouth with water  after your last puff, gargle and spit out the water. Do not swallow the water. °GET HELP IF: °· The inhaler medicine only partially helps to stop wheezing or shortness of breath. °· You are having trouble using your inhaler. °· You have some increase in thick spit (phlegm). °GET HELP RIGHT AWAY IF: °· The inhaler medicine does not help your wheezing or shortness of breath or you have tightness in your chest. °· You have dizziness, headaches, or fast heart rate. °· You have chills, fever, or night sweats. °· You have a large increase of thick spit, or your thick spit is bloody. °MAKE SURE YOU:  °· Understand these instructions. °· Will watch your condition. °· Will get help right away if you are not doing well or get worse. °  °This information is not intended to replace advice given to you by your health care provider. Make sure you discuss any questions you have with your health care provider. °  °Document Released: 07/22/2008 Document Revised: 08/03/2013 Document Reviewed: 05/12/2013 °Elsevier Interactive Patient Education ©2016 [employer] ° °Upper Respiratory Infection, Adult °Most upper respiratory infections (URIs) are caused by a virus. A URI affects the nose, throat, and upper air passages. The most common type of URI is often called "the common cold." °HOME CARE  °· Take medicines only as   told by your doctor. °· Gargle warm saltwater or take cough drops to comfort your throat as told by your doctor. °· Use a warm mist humidifier or inhale steam from a shower to increase air moisture. This may make it easier to breathe. °· Drink enough fluid to keep your pee (urine) clear or pale yellow. °· Eat soups and other clear broths. °· Have a healthy diet. °· Rest as needed. °· Go back to work when your fever is gone or your doctor says it is okay. °¨ You may need to stay home longer to avoid giving your URI to others. °¨ You can also wear a face mask and wash your hands often to prevent spread of the virus. °· Use  your inhaler more if you have asthma. °· Do not use any tobacco products, including cigarettes, chewing tobacco, or electronic cigarettes. If you need help quitting, ask your doctor. °GET HELP IF: °· You are getting worse, not better. °· Your symptoms are not helped by medicine. °· You have chills. °· You are getting more short of breath. °· You have brown or red mucus. °· You have yellow or brown discharge from your nose. °· You have pain in your face, especially when you bend forward. °· You have a fever. °· You have puffy (swollen) neck glands. °· You have pain while swallowing. °· You have white areas in the back of your throat. °GET HELP RIGHT AWAY IF:  °· You have very bad or constant: °¨ Headache. °¨ Ear pain. °¨ Pain in your forehead, behind your eyes, and over your cheekbones (sinus pain). °¨ Chest pain. °· You have long-lasting (chronic) lung disease and any of the following: °¨ Wheezing. °¨ Long-lasting cough. °¨ Coughing up blood. °¨ A change in your usual mucus. °· You have a stiff neck. °· You have changes in your: °¨ Vision. °¨ Hearing. °¨ Thinking. °¨ Mood. °MAKE SURE YOU:  °· Understand these instructions. °· Will watch your condition. °· Will get help right away if you are not doing well or get worse. °  °This information is not intended to replace advice given to you by your health care provider. Make sure you discuss any questions you have with your health care provider. °  °Document Released: 03/31/2008 Document Revised: 02/27/2015 Document Reviewed: 01/18/2014 °Elsevier Interactive Patient Education ©2016 [employer] ° °

## 2015-11-01 NOTE — ED Provider Notes (Signed)
CSN: [id number]     Arrival date & time 11/01/15  1240 History   First MD Initiated Contact with Patient 11/01/15 1504    Nurses notes were reviewed. Chief Complaint  Patient presents with  . Fever  . Cough   Patient states she's been sick since Christmas Eve . While we are not blaming Santa the cold cough and congestion progressively got worse by Christmas day. She reports nausea congestion runny nose but thought there was a cold until about 2 days ago she started running a fever the congestion got worse and now she feels something sitting on her chest and she still some wheezing.  (Consider location/radiation/quality/duration/timing/severity/associated sxs/prior Treatment) Patient is a 25 y.o. female presenting with fever and cough. The history is provided by the patient. No language interpreter was used.  Fever Severity:  Moderate Onset quality:  Sudden Timing:  Rare Progression:  Worsening Chronicity:  New Relieved by:  Nothing Worsened by:  Nothing tried Associated symptoms: cough   Associated symptoms: no chest pain and no rash   Risk factors: no hx of cancer and no recent surgery   Cough Associated symptoms: fever   Associated symptoms: no chest pain and no rash     Past Medical History  Diagnosis Date  . Herpes simplex virus infection     1 and 2  . Allergic rhinitis   . Chronic post-traumatic stress disorder (PTSD)   . Reduction defect of left lower extremity     Short L leg- wears lift  . Back pain     3 bulging discs, arthritis and scoliosis 9/15   History reviewed. No pertinent past surgical history. Family History  Problem Relation Age of Onset  . Alcohol abuse Father   . Arthritis Father   . Hyperlipidemia Father   . Hypertension Father   . Thyroid disease Brother    Social History  Substance Use Topics  . Smoking status: Current Every Day Smoker -- 0.33 packs/day  . Smokeless tobacco: Never Used  . Alcohol Use: Yes     Comment: rare   OB History     No data available     Review of Systems  Constitutional: Positive for fever.  Respiratory: Positive for cough.   Cardiovascular: Negative for chest pain.  Skin: Negative for rash.  All other systems reviewed and are negative.   Allergies  Sertraline hcl and Wellbutrin  Home Medications   Prior to Admission medications   Medication Sig Start Date End Date Taking? Authorizing Provider  citalopram (CELEXA) 40 MG tablet Take 60 mg by mouth daily.   Yes Megan P Johnson, DO  albuterol (PROVENTIL HFA;VENTOLIN HFA) 108 (90 Base) MCG/ACT inhaler Inhale 2 puffs into the lungs every 4 (four) hours as needed for wheezing or shortness of breath. 11/01/15   Hassan Rowan, MD  azithromycin (ZITHROMAX) 500 MG tablet 1 tablet by mouth daily for the next 5 days 11/01/15   Hassan Rowan, MD  chlorpheniramine-HYDROcodone [facility]) 10-8 MG/5ML SUER Take 5 mLs by mouth every 12 (twelve) hours as needed for cough. 11/01/15   Hassan Rowan, MD  clonazePAM (KLONOPIN) 1 MG tablet Take 1 tablet (1 mg total) by mouth daily as needed for anxiety. 10/01/15   Megan P Johnson, DO  Etonogestrel (NEXPLANON [place]) Inject into the skin.    Historical Provider, MD  predniSONE (STERAPRED UNI-PAK 21 TAB) 10 MG (21) TBPK tablet Sig 6 tablet day 1, 5 tablets day 2, 4 tablets day 3,,3tablets day 4, 2  tablets day 5, 1 tablet day 6 take all tablets orally 11/01/15   Hassan RowanEugene Yaneth Fairbairn, MD   Meds Ordered and Administered this Visit   Medications  ipratropium-albuterol (DUONEB) 0.5-2.5 (3) MG/3ML nebulizer solution 3 mL (3 mLs Nebulization Given 11/01/15 1525)    BP 136/86 mmHg  Pulse 90  Temp(Src) 98 F (36.7 C) (Oral)  Resp 16  SpO2 99% No data found.   Physical Exam  Constitutional: She is oriented to person, place, and time. She appears well-developed and well-nourished.  HENT:  Head: Normocephalic.  Right Ear: External ear normal.  Left Ear: External ear normal.  Eyes: Conjunctivae are normal. Pupils are equal,  round, and reactive to light.  Neck: Normal range of motion. Neck supple.  Cardiovascular: Normal rate, regular rhythm and normal heart sounds.   Pulmonary/Chest: She has decreased breath sounds. She has wheezes.  cough  Musculoskeletal: Normal range of motion. She exhibits no edema.  Neurological: She is alert and oriented to person, place, and time.  Skin: Skin is warm and dry.  Psychiatric: She has a normal mood and affect.  Vitals reviewed.   ED Course  Procedures (including critical care time)  Labs Review Labs Reviewed - No data to display  Imaging Review No results found.   Visual Acuity Review  Right Eye Distance:   Left Eye Distance:   Bilateral Distance:    Right Eye Near:   Left Eye Near:    Bilateral Near:         MDM   1. Bronchitis with bronchospasm    We'll give her a DuoNeb treatment here. Tussionex 1 teaspoon twice a day, albuterol inhaler, Zithromax 5 days, and prednisone for 6 days by mouth. Follow-up PCP if not better in 1-2 weeks work note given for today as well.    Hassan RowanEugene Garrie Elenes, MD 11/01/15 [phone]

## 2015-11-02 ENCOUNTER — Ambulatory Visit: Payer: BLUE CROSS/BLUE SHIELD | Admitting: Family Medicine

## 2015-11-12 ENCOUNTER — Telehealth: Payer: Self-pay

## 2015-11-12 MED ORDER — CITALOPRAM HYDROBROMIDE 40 MG PO TABS: 60.0000 mg | ORAL_TABLET | Freq: Every day | ORAL | Status: DC

## 2015-11-12 NOTE — Telephone Encounter (Signed)
All set!

## 2015-11-12 NOTE — Telephone Encounter (Signed)
Pharmacy called, a prior authorization was needed for the citalopram. Spoke with the pharmacist, the medication is very cheap so it can be run without insurance. Patient agreed to pay cash out of pocket. Dr.Johnson, please add the verbal prescription for Citalopram 45mg  take 1.5 tablets daily amt 45 with 6 refills, it has been called into the pharmacy already.

## 2016-02-28 ENCOUNTER — Encounter: Payer: Self-pay | Admitting: Family Medicine

## 2016-02-28 ENCOUNTER — Ambulatory Visit (INDEPENDENT_AMBULATORY_CARE_PROVIDER_SITE_OTHER): Payer: BLUE CROSS/BLUE SHIELD | Admitting: Family Medicine

## 2016-02-28 VITALS — BP 133/78 | HR 81 | Temp 98.5°F | Wt 275.0 lb

## 2016-02-28 DIAGNOSIS — M9900 Segmental and somatic dysfunction of head region: Secondary | ICD-10-CM | POA: Diagnosis not present

## 2016-02-28 DIAGNOSIS — M9902 Segmental and somatic dysfunction of thoracic region: Secondary | ICD-10-CM

## 2016-02-28 DIAGNOSIS — M9901 Segmental and somatic dysfunction of cervical region: Secondary | ICD-10-CM

## 2016-02-28 DIAGNOSIS — M436 Torticollis: Secondary | ICD-10-CM | POA: Diagnosis not present

## 2016-02-28 DIAGNOSIS — M9908 Segmental and somatic dysfunction of rib cage: Secondary | ICD-10-CM | POA: Diagnosis not present

## 2016-02-28 MED ORDER — CYCLOBENZAPRINE HCL 10 MG PO TABS: 10.0000 mg | ORAL_TABLET | Freq: Every day | ORAL | Status: DC

## 2016-02-28 NOTE — Addendum Note (Signed)
Addended by: Dorcas CarrowJOHNSON, MEGAN P on: 02/28/2016 03:25 PM   Modules accepted: Orders

## 2016-02-28 NOTE — Progress Notes (Signed)
BP 133/78 mmHg  Pulse 81  Temp(Src) 98.5 F (36.9 C)  Wt 275 lb (124.739 kg)  SpO2 100%  LMP 01/28/2016 (Approximate)   Subjective:    Patient ID: Alexa Diaz, female    DOB: 11-Feb-1991, 25 y.o.   MRN: [id number]  HPI: Alexa Diaz is a 25 y.o. female  Chief Complaint  Patient presents with  . Neck Pain   This is a new problem. Started with neck pain about 5 days ago while at work. Thinks it's from carrying trays. She notes that it is at the back of her neck on both sides but mainly the right. It is tight and severe. She feels like she can't turn her head. She notes that it radiates down into her shoulder and a bit into her arm. Worse with moving her head and carrying things. Better with heat and rest. She would like to try OMT as it has helped in the past. She is otherwise doing OK with no other concerns or complaints at this time.   Relevant past medical, surgical, family and social history reviewed and updated as indicated. Interim medical history since our last visit reviewed. Allergies and medications reviewed and updated.  Review of Systems  Constitutional: Negative.   Respiratory: Negative.   Cardiovascular: Negative.   Musculoskeletal: Positive for myalgias, neck pain and neck stiffness. Negative for back pain, joint swelling, arthralgias and gait problem.  Skin: Negative.   Psychiatric/Behavioral: Negative.     Per HPI unless specifically indicated above     Objective:    BP 133/78 mmHg  Pulse 81  Temp(Src) 98.5 F (36.9 C)  Wt 275 lb (124.739 kg)  SpO2 100%  LMP 01/28/2016 (Approximate)  Wt Readings from Last 3 Encounters:  02/28/16 275 lb (124.739 kg)  10/01/15 272 lb (123.378 kg)  09/06/15 270 lb (122.471 kg)    Physical Exam  Constitutional: She is oriented to person, place, and time. She appears well-developed and well-nourished. No distress.  HENT:  Head: Normocephalic and atraumatic.  Right Ear: Hearing normal.  Left Ear:  Hearing normal.  Nose: Nose normal.  Eyes: Conjunctivae and lids are normal. Right eye exhibits no discharge. Left eye exhibits no discharge. No scleral icterus.  Pulmonary/Chest: Effort normal. No respiratory distress.  Abdominal: Soft. She exhibits no distension and no mass. There is no tenderness. There is no rebound and no guarding.  Musculoskeletal: She exhibits tenderness. She exhibits no edema.  Neurological: She is alert and oriented to person, place, and time.  Skin: Skin is warm, dry and intact. No rash noted. No erythema. No pallor.  Psychiatric: She has a normal mood and affect. Her speech is normal and behavior is normal. Judgment and thought content normal. Cognition and memory are normal.  Nursing note and vitals reviewed. Musculoskeletal:  Exam found Decreased ROM, Tissue texture changes, Tenderness to palpation and Asymmetry of patient's  head, neck, thorax and ribs Osteopathic Structural Exam:   Head: hypertonic suboccipital muscles, OAESSL, OM suture restricted on the R  Neck: SCM hypertonic bilaterally, Trap spasm bilaterally R>>L, C3ESRR, C4ESRL, C5ESRR, C6ESRL  Thorax: T2-3SLRR  Ribs: Ribs 3-4 locked up on the L, Rib 3 locked up on the R     Assessment & Plan:   Problem List Items Addressed This Visit    None    Visit Diagnoses    Torticollis, acute    -  Primary    Likely due to stress and work. Flexeril. Heat. Has SD that would benefit  from OMT. Patient treated today as discussed below. Return 2 weeks if needed.     Thoracic region somatic dysfunction        Somatic dysfunction of spine, cervical        Cranial somatic dysfunction         After verbal consent was obtained, patient was treated today with osteopathic manipulative medicine to the regions of the head, neck, thorax and ribs using the techniques of cranial, Still, FPR, myofascial release, counterstrain, muscle energy, HVLA and soft tissue. Areas of compensation relating to her primary pain source also  treated. Patient tolerated the procedure well with good objective and good subjective improvement in symptoms. She left the room in good condition. She was advised to stay well hydrated and that she may have some soreness following the procedure. If not improving or worsening, she will call and come in. Home exercise program of stretches for neck and traps discussed and demonstrated today. Patient will do these stretches BID to before the point of pain, and will return for reevaluation  in 1-2 weeks if needed.    Follow up plan: Return in about 2 weeks (around 03/13/2016) for OMT if needed.

## 2016-02-28 NOTE — Patient Instructions (Addendum)
EXERCISES RANGE OF MOTION (ROM) AND STRETCHING EXERCISES - Trapezius Spasm These exercises may help you when beginning to rehabilitate your injury. Your symptoms may resolve with or without further involvement from your physician, physical therapist or athletic trainer. While completing these exercises, remember:   Restoring tissue flexibility helps normal motion to return to the joints. This allows healthier, less painful movement and activity.  An effective stretch should be held for at least 30 seconds.  A stretch should never be painful. You should only feel a gentle lengthening or release in the stretched tissue. STRETCH - Flexion, Standing  Stand with good posture. With an underhand grip on your right / left and an overhand grip on the opposite hand, grasp a broomstick or cane so that your hands are a little more than shoulder-width apart.  Keeping your right / left elbow straight and shoulder muscles relaxed, push the stick with your opposite hand to raise your right / left arm in front of your body and then overhead. Raise your arm until you feel a stretch in your right / left shoulder, but before you have increased shoulder pain.  Try to avoid shrugging your right / left shoulder as your arm rises by keeping your shoulder blade tucked down and toward your mid-back spine. Hold __________ seconds.  Slowly return to the starting position. Repeat __________ times. Complete this exercise __________ times per day.  STRETCH - Abduction, Supine  Stand with good posture. With an underhand grip on your right / left and an overhand grip on the opposite hand, grasp a broomstick or cane so that your hands are a little more than shoulder-width apart.  Keeping your right / left elbow straight and shoulder muscles relaxed, push the stick with your opposite hand to raise your right / left arm out to the side of your body and then overhead. Raise your arm until you feel a stretch in your right / left  shoulder, but before you have increased shoulder pain.  Try to avoid shrugging your right / left shoulder as your arm rises by keeping your shoulder blade tucked down and toward your mid-back spine. Hold __________ seconds.  Slowly return to the starting position. Repeat __________ times. Complete this exercise __________ times per day.  ROM - Flexion, Active-Assisted  Lie on your back. You may bend your knees for comfort.  Grasp a broomstick or cane so your hands are about shoulder-width apart. Your right / left hand should grip the end of the stick/cane so that your hand is positioned "thumbs-up," as if you were about to shake hands.  Using your healthy arm to lead, raise your right / left arm overhead until you feel a gentle stretch in your shoulder. Hold __________ seconds.  Use the stick/cane to assist in returning your right / left arm to its starting position. Repeat __________ times. Complete this exercise __________ times per day.  STRETCH - External Rotation and Abduction  Stagger your stance through a doorframe. It does not matter which foot is forward.  Choose one of the following positions as instructed by your physician, physical therapist or athletic trainer: place your hands:  and forearms above your head and on the door frame.  and forearms at head-height and on the door frame.  at elbow-height and on the door frame.  Keeping your head and chest upright and your stomach muscles tight to prevent over-extending your low-back, slowly shift your weight onto your front foot until you feel a stretch across your   chest and/or in the front of your shoulders.  Hold __________ seconds. Shift your weight to your back foot to release the stretch. Repeat __________ times. Complete this stretch __________ times per day.  STRENGTHENING EXERCISES - Trapezius Spasm These exercises may help you when beginning to rehabilitate your injury. They may resolve your symptoms with or without  further involvement from your physician, physical therapist or athletic trainer. While completing these exercises, remember:   Muscles can gain both the endurance and the strength needed for everyday activities through controlled exercises.  Complete these exercises as instructed by your physician, physical therapist or athletic trainer. Progress with the resistance and repetition exercises only as your caregiver advises.  You may experience muscle soreness or fatigue, but the pain or discomfort you are trying to eliminate should never worsen during these exercises. If this pain does worsen, stop and make certain you are following the directions exactly. If the pain is still present after adjustments, discontinue the exercise until you can discuss the trouble with your clinician. STRENGTH - Scapular Depression and Adduction  With good posture, sit on a firm chair. Support your arms in front of you with pillows, arm rests or a table top. Have your elbows in line with the sides of your body.  Gently draw your shoulder blades down and toward your mid-back spine. Gradually increase the tension without tensing the muscles along the top of your shoulders and the back of your neck.  Hold for __________ seconds. Slowly release the tension and relax your muscles completely before completing the next repetition.  After you have practiced this exercise, remove the arm support and complete it while standing as well as sitting. Repeat __________ times. Complete this exercise __________ times per day.  STRENGTH - Shoulder Abductors, Isometric   With good posture, stand or sit about 4-6 inches from a wall with your right / left side facing the wall.  Bend your right / left elbow. Gently press your right / left elbow into the wall. Increase the pressure gradually until you are pressing as hard as you can without shrugging your shoulder or increasing any shoulder discomfort.  Hold __________  seconds.  Release the tension slowly. Relax your shoulder muscles completely before you do the next repetition. Repeat __________ times. Complete this exercise __________ times per day.  STRENGTH - Shoulder Flexion, Isometric  With good posture and facing a wall, stand or sit about 4-6 inches away.  Keeping your right / left elbow straight, gently press the top of your fist into the wall. Increase the pressure gradually until you are pressing as hard as you can without shrugging your shoulder or increasing any shoulder discomfort.  Hold __________ seconds.  Release the tension slowly. Relax your shoulder muscles completely before you do the next repetition. Repeat __________ times. Complete this exercise __________ times per day.  STRENGTH - Internal Rotators  Secure a rubber exercise band/tubing to a fixed object so that it is at the same height as your right / left elbow when you are standing or sitting on a firm surface.  Stand or sit so that the secured exercise band/tubing is at your right / left side.  Bend your elbow 90 degrees. Place a folded towel or small pillow under your right / left arm so that your elbow is a few inches away from your side.  Keeping the tension on the exercise band/tubing, pull it across your body toward your abdomen. Be sure to keep your body steady so   that the movement is only coming from your shoulder rotating.  Hold __________ seconds. Release the tension in a controlled manner as you return to the starting position. Repeat __________ times. Complete this exercise __________ times per day.  STRENGTH - External Rotators  Secure a rubber exercise band/tubing to a fixed object so that it is at the same height as your right / left elbow when you are standing or sitting on a firm surface.  Stand or sit so that the secured exercise band/tubing is at your side that is not injured.  Bend your elbow 90 degrees. Place a folded towel or small pillow under your  right / left arm so that your elbow is a few inches away from your side.  Keeping the tension on the exercise band/tubing, pull it away from your body, as if pivoting on your elbow. Be sure to keep your body steady so that the movement is only coming from your shoulder rotating.  Hold __________ seconds. Release the tension in a controlled manner as you return to the starting position. Repeat __________ times. Complete this exercise __________ times per day.  STRENGTH - Shoulder Extensors  Secure a rubber exercise band/tubing so that it is at the height of your shoulders when you are either standing or sitting on a firm arm-less chair.  With a thumbs-up grip, grasp an end of the band/tubing in each hand. Straighten your elbows and lift your hands straight in front of you at shoulder height. Step back away from the secured end of band/tubing until it becomes tense.  Squeezing your shoulder blades together, pull your hands down to the sides of your thighs. Do not allow your hands to go behind you.  Hold for __________ seconds. Slowly ease the tension on the band/tubing as you reverse the directions and return to the starting position. Repeat __________ times. Complete this exercise __________ times per day.  STRENGTH - Shoulder Extensors, Prone  Lie on your stomach on a firm surface so that your right / left arm overhangs the edge. Rest your forehead on your opposite forearm. With your thumb facing away from your body and your elbow straight, hold a __________ weight in your hand.  Squeeze your right / left shoulder blade to your mid-back spine and then slowly raise your arm behind you to the height of the bed.  Hold for __________ seconds. Slowly reverse the directions and return to the starting position, controlling the weight as you lower your arm. Repeat __________ times. Complete this exercise __________ times per day.  STRENGTH - Horizontal Abductors Choose one of the two oppositions to  complete this exercise. Prone (lying on stomach):  Lie on your stomach on a firm surface so that your right / left arm overhangs the edge. Rest your forehead on your opposite forearm. With your palm facing the floor and your elbow straight, hold a __________ weight in your hand.  Squeeze your right / left shoulder blade to your mid-back spine and then slowly raise your arm to the height of the bed.  Hold for __________ seconds. Slowly reverse the directions and return to the starting position, controlling the weight as you lower your arm. Repeat __________ times. Complete this exercise __________ times per day. Standing:   Secure a rubber exercise band/tubing so that it is at the height of your shoulders when you are either standing or sitting on a firm arm-less chair.  Grasp an end of the band/tubing in each hand and have your palms   face each other. Straighten your elbows and lift your hands straight in front of you at shoulder height. Step back away from the secured end of band/tubing until it becomes tense.  Squeeze your shoulder blades together. Keeping your elbows locked and your hands at shoulder-height, bring your hands out to your side.  Hold __________ seconds. Slowly ease the tension on the band/tubing as you reverse the directions and return to the starting position. Repeat __________ times. Complete this exercise __________ times per day. STRENGTH - Scapular Retractors and Elevators  Secure a rubber exercise band/tubing so that it is at the height of your shoulders when you are either standing or sitting on a firm arm-less chair.  With a thumbs-up grip, grasp an end of the band/tubing in each hand. Step back away from the secured end of band/tubing until it becomes tense.  Squeezing your shoulder blades together, straighten your elbows and lift your hands straight over your head.  Hold for __________ seconds. Slowly ease the tension on the band/tubing as you reverse the  directions and return to the starting position. Repeat __________ times. Complete this exercise __________ times per day.    This information is not intended to replace advice given to you by your health care provider. Make sure you discuss any questions you have with your health care provider.   Document Released: 10/13/2005 Document Revised: 02/27/2015 Document Reviewed: 01/25/2009 [employer] Interactive Patient Education 2016 [employer]  

## 2016-03-03 ENCOUNTER — Ambulatory Visit: Payer: Self-pay | Admitting: Family Medicine

## 2016-03-04 ENCOUNTER — Ambulatory Visit: Payer: Self-pay | Admitting: Family Medicine

## 2016-03-14 ENCOUNTER — Encounter: Payer: Self-pay | Admitting: Family Medicine

## 2016-03-14 ENCOUNTER — Ambulatory Visit (INDEPENDENT_AMBULATORY_CARE_PROVIDER_SITE_OTHER): Payer: BLUE CROSS/BLUE SHIELD | Admitting: Family Medicine

## 2016-03-14 VITALS — BP 136/91 | HR 70 | Temp 98.4°F | Wt 271.0 lb

## 2016-03-14 DIAGNOSIS — M2170 Unequal limb length (acquired), unspecified site: Secondary | ICD-10-CM | POA: Diagnosis not present

## 2016-03-14 DIAGNOSIS — M9908 Segmental and somatic dysfunction of rib cage: Secondary | ICD-10-CM | POA: Diagnosis not present

## 2016-03-14 DIAGNOSIS — M9902 Segmental and somatic dysfunction of thoracic region: Secondary | ICD-10-CM | POA: Diagnosis not present

## 2016-03-14 DIAGNOSIS — M9909 Segmental and somatic dysfunction of abdomen and other regions: Secondary | ICD-10-CM

## 2016-03-14 DIAGNOSIS — M545 Low back pain, unspecified: Secondary | ICD-10-CM

## 2016-03-14 DIAGNOSIS — M9901 Segmental and somatic dysfunction of cervical region: Secondary | ICD-10-CM

## 2016-03-14 DIAGNOSIS — M9903 Segmental and somatic dysfunction of lumbar region: Secondary | ICD-10-CM | POA: Diagnosis not present

## 2016-03-14 DIAGNOSIS — M9905 Segmental and somatic dysfunction of pelvic region: Secondary | ICD-10-CM

## 2016-03-14 DIAGNOSIS — M9904 Segmental and somatic dysfunction of sacral region: Secondary | ICD-10-CM | POA: Diagnosis not present

## 2016-03-14 NOTE — Assessment & Plan Note (Signed)
In acute exacerbation again today due to not wearing her heel lift. This seems to be myofascial and structural in nature and acute. She does have some somatic dysfunction that I think would benefit from OMT. Treated today as discussed below.

## 2016-03-14 NOTE — Progress Notes (Signed)
BP 136/91 mmHg  Pulse 70  Temp(Src) 98.4 F (36.9 C)  Wt 271 lb (122.925 kg)  SpO2 98%  LMP 01/28/2016 (Approximate)   Subjective:    Patient ID: Alexa Diaz, female    DOB: 02/23/1991, 25 y.o.   MRN: [id number]  HPI: Alexa Diaz is a 25 y.o. female  Chief Complaint  Patient presents with  . OMM   Neck is feeling so much better. Was tight for 1 day, but then started feeling 100% better. She does note that her back is hurting a bit more. She has been wearing flip-flops more so has not been wearing her heel lift and is noticing that her hips feel off and that her back is hurting more. Mainly on the L side from her middle back down to her hip. It's aching and tight. Work and wearing flip-flops makes it worse. OMT makes it better, as does heat and stretching and wearing her heel lift. It's there all the time. No other concerns today. She is otherwise feeling well.   Relevant past medical, surgical, family and social history reviewed and updated as indicated. Interim medical history since our last visit reviewed. Allergies and medications reviewed and updated.  Review of Systems  Constitutional: Negative.   Respiratory: Negative.   Cardiovascular: Negative.   Musculoskeletal: Positive for myalgias and back pain. Negative for joint swelling, arthralgias, gait problem, neck pain and neck stiffness.  Psychiatric/Behavioral: Negative.     Per HPI unless specifically indicated above     Objective:    BP 136/91 mmHg  Pulse 70  Temp(Src) 98.4 F (36.9 C)  Wt 271 lb (122.925 kg)  SpO2 98%  LMP 01/28/2016 (Approximate)  Wt Readings from Last 3 Encounters:  03/14/16 271 lb (122.925 kg)  02/28/16 275 lb (124.739 kg)  10/01/15 272 lb (123.378 kg)    Physical Exam  Constitutional: She is oriented to person, place, and time. She appears well-developed and well-nourished. No distress.  HENT:  Head: Normocephalic and atraumatic.  Right Ear: Hearing normal.  Left  Ear: Hearing normal.  Nose: Nose normal.  Eyes: Conjunctivae and lids are normal. Right eye exhibits no discharge. Left eye exhibits no discharge. No scleral icterus.  Pulmonary/Chest: Effort normal. No respiratory distress.  Abdominal: Soft. She exhibits no distension and no mass. There is no tenderness. There is no rebound and no guarding.  Neurological: She is alert and oriented to person, place, and time.  Skin: Skin is warm, dry and intact. No rash noted. No erythema. No pallor.  Psychiatric: She has a normal mood and affect. Her speech is normal and behavior is normal. Judgment and thought content normal. Cognition and memory are normal.  Nursing note and vitals reviewed. Musculoskeletal:  Exam found Decreased ROM, Tissue texture changes, Tenderness to palpation and Asymmetry of patient's  neck, thorax, ribs, lumbar, pelvis, sacrum and abdomen Osteopathic Structural Exam:   Neck: C4ESRR, C6ESRL  Thorax: T6-9SLRR  Ribs: Ribs 5-6 locked up on the L  Lumbar: QL hypertonic on the L, psoas spasm on the L, L2-4SLRR  Pelvis: Anterior L innominate  Sacrum: L on L torsion, SI joint restricted on the L  Abdomen: diaphragm spasm bilaterally L>R       Assessment & Plan:   Problem List Items Addressed This Visit      Other   Leg length discrepancy - Primary    Patient seems to have leg-length discrepancy that is doing very well with her heel lift, currently acting up because  she has been wearing flip-flops. Will look into building up the bottom of her L filp flop 3mm.  The rest of her pain seems to be due to functional and mechanical changes. I think that she would benefit from OMT. She was treated today as discussed below.         Low back pain    In acute exacerbation again today due to not wearing her heel lift. This seems to be myofascial and structural in nature and acute. She does have some somatic dysfunction that I think would benefit from OMT. Treated today as discussed below.            Other Visit Diagnoses    Somatic dysfunction of spine, cervical        Thoracic region somatic dysfunction        Pelvic region somatic dysfunction        Sacral region somatic dysfunction        Lumbar region somatic dysfunction        Rib cage region somatic dysfunction        Somatic dysfunction of abdominal region          After verbal consent was obtained, patient was treated today with osteopathic manipulative medicine to the regions of the neck, thorax, ribs, lumbar, pelvis, sacrum and abdomen using the techniques of myofascial release, counterstrain, muscle energy, HVLA and soft tissue. Areas of compensation relating to her primary pain source also treated. Patient tolerated the procedure well with good objective and good subjective improvement in symptoms. She left the room in good condition. She was advised to stay well hydrated and that she may have some soreness following the procedure. If not improving or worsening, she will call and come in. She will return for reevaluation  In 3-4 weeks.   Follow up plan: Return in about 4 weeks (around 04/11/2016) for OMM.

## 2016-03-14 NOTE — Assessment & Plan Note (Signed)
Patient seems to have leg-length discrepancy that is doing very well with her heel lift, currently acting up because she has been wearing flip-flops. Will look into building up the bottom of her L filp flop 3mm.  The rest of her pain seems to be due to functional and mechanical changes. I think that she would benefit from OMT. She was treated today as discussed below.

## 2016-04-14 ENCOUNTER — Encounter: Payer: Self-pay | Admitting: Family Medicine

## 2016-04-14 ENCOUNTER — Ambulatory Visit (INDEPENDENT_AMBULATORY_CARE_PROVIDER_SITE_OTHER): Payer: BLUE CROSS/BLUE SHIELD | Admitting: Family Medicine

## 2016-04-14 VITALS — BP 117/80 | HR 78 | Temp 98.6°F | Ht 64.4 in | Wt 272.0 lb

## 2016-04-14 DIAGNOSIS — M9902 Segmental and somatic dysfunction of thoracic region: Secondary | ICD-10-CM | POA: Diagnosis not present

## 2016-04-14 DIAGNOSIS — M545 Low back pain, unspecified: Secondary | ICD-10-CM

## 2016-04-14 DIAGNOSIS — M9905 Segmental and somatic dysfunction of pelvic region: Secondary | ICD-10-CM | POA: Diagnosis not present

## 2016-04-14 DIAGNOSIS — M9904 Segmental and somatic dysfunction of sacral region: Secondary | ICD-10-CM | POA: Diagnosis not present

## 2016-04-14 DIAGNOSIS — M9908 Segmental and somatic dysfunction of rib cage: Secondary | ICD-10-CM

## 2016-04-14 DIAGNOSIS — M9901 Segmental and somatic dysfunction of cervical region: Secondary | ICD-10-CM | POA: Diagnosis not present

## 2016-04-14 DIAGNOSIS — M9903 Segmental and somatic dysfunction of lumbar region: Secondary | ICD-10-CM | POA: Diagnosis not present

## 2016-04-14 NOTE — Progress Notes (Signed)
BP 117/80 mmHg  Pulse 78  Temp(Src) 98.6 F (37 C)  Ht 5' 4.4" (1.636 m)  Wt 272 lb (123.378 kg)  BMI 46.10 kg/m2  SpO2 100%   Subjective:    Patient ID: Alexa Diaz, female    DOB: 07/07/1991, 25 y.o.   MRN: [id number]  HPI: Alexa Diaz is a 25 y.o. female  Chief Complaint  Patient presents with  . Back Pain   Sheray states that she is feeling much better. She has been wearing her heel lift more and feels like her back is more like it was before. She's still feeling tight in her low back, but no radiating pain. Pain is more tight and dull, no so much severe. Better with OMT and she would like to continue with that. Worse with certain activities. She is otherwise feeling well with no other concerns or complaints at this time. She states that OMT helped last visit for about 3.5 weeks, and she started feeling tight again a few days ago. No other concerns.   Relevant past medical, surgical, family and social history reviewed and updated as indicated. Interim medical history since our last visit reviewed. Allergies and medications reviewed and updated.  Review of Systems  Constitutional: Negative.   Respiratory: Negative.   Cardiovascular: Negative.   Musculoskeletal: Positive for myalgias and back pain. Negative for joint swelling, arthralgias, gait problem, neck pain and neck stiffness.  Psychiatric/Behavioral: Negative.     Per HPI unless specifically indicated above     Objective:    BP 117/80 mmHg  Pulse 78  Temp(Src) 98.6 F (37 C)  Ht 5' 4.4" (1.636 m)  Wt 272 lb (123.378 kg)  BMI 46.10 kg/m2  SpO2 100%  Wt Readings from Last 3 Encounters:  04/14/16 272 lb (123.378 kg)  03/14/16 271 lb (122.925 kg)  02/28/16 275 lb (124.739 kg)    Physical Exam  Constitutional: She is oriented to person, place, and time. She appears well-developed and well-nourished. No distress.  HENT:  Head: Normocephalic and atraumatic.  Right Ear: Hearing normal.   Left Ear: Hearing normal.  Nose: Nose normal.  Eyes: Conjunctivae and lids are normal. Right eye exhibits no discharge. Left eye exhibits no discharge. No scleral icterus.  Pulmonary/Chest: Effort normal. No respiratory distress.  Abdominal: Soft. She exhibits no distension and no mass. There is no tenderness. There is no rebound and no guarding.  Neurological: She is alert and oriented to person, place, and time.  Skin: Skin is warm, dry and intact. No rash noted. No erythema. No pallor.  Psychiatric: She has a normal mood and affect. Her speech is normal and behavior is normal. Judgment and thought content normal. Cognition and memory are normal.  Nursing note and vitals reviewed. Musculoskeletal:  Exam found Decreased ROM, Tissue texture changes, Tenderness to palpation and Asymmetry of patient's  neck, thorax, ribs, lumbar, pelvis and sacrum Osteopathic Structural Exam:   Neck: C4ESRR, C5ESRL  Thorax: T4-6SLRR  Ribs: Ribs 6-9 locked up on the L  Lumbar: L4-5SLRR  Pelvis: Posterior L innominate  Sacrum: L on L torsion    Assessment & Plan:   Problem List Items Addressed This Visit      Other   Low back pain - Primary    Doing better since wearing her heel lift a bit more. This seems to be myofascial and structural in nature and acute. She does have some somatic dysfunction that I think would benefit from OMT. Treated today as discussed below.  Other Visit Diagnoses    Somatic dysfunction of spine, cervical        Thoracic region somatic dysfunction        Pelvic region somatic dysfunction        Sacral region somatic dysfunction        Lumbar region somatic dysfunction        Rib cage region somatic dysfunction          After verbal consent was obtained, patient was treated today with osteopathic manipulative medicine to the regions of the neck, thorax, ribs, lumbar, pelvis and sacrum using the techniques of myofascial release, counterstrain, muscle energy,  HVLA and soft tissue. Areas of compensation relating to her primary pain source also treated. Patient tolerated the procedure well with good objective and good subjective improvement in symptoms. She left the room in good condition. She was advised to stay well hydrated and that she may have some soreness following the procedure. If not improving or worsening, she will call and come in. She will return for reevaluation  in 1-2 months.   Follow up plan: Return in about 4 weeks (around 05/12/2016).

## 2016-04-14 NOTE — Assessment & Plan Note (Signed)
Doing better since wearing her heel lift a bit more. This seems to be myofascial and structural in nature and acute. She does have some somatic dysfunction that I think would benefit from OMT. Treated today as discussed below.

## 2016-04-16 ENCOUNTER — Encounter: Payer: Self-pay | Admitting: Emergency Medicine

## 2016-04-16 ENCOUNTER — Ambulatory Visit
Admission: EM | Admit: 2016-04-16 | Discharge: 2016-04-16 | Disposition: A | Discharge status: Home / Self Care | Payer: BLUE CROSS/BLUE SHIELD | Attending: Family Medicine | Admitting: Family Medicine

## 2016-04-16 DIAGNOSIS — R05 Cough: Secondary | ICD-10-CM | POA: Diagnosis not present

## 2016-04-16 DIAGNOSIS — J0110 Acute frontal sinusitis, unspecified: Secondary | ICD-10-CM | POA: Diagnosis not present

## 2016-04-16 DIAGNOSIS — R059 Cough, unspecified: Secondary | ICD-10-CM

## 2016-04-16 MED ORDER — AZITHROMYCIN 250 MG PO TABS: ORAL_TABLET | ORAL | Status: DC

## 2016-04-16 NOTE — ED Provider Notes (Signed)
CSN: [id number]     Arrival date & time 04/16/16  1125 History   First MD Initiated Contact with Patient 04/16/16 1210     Chief Complaint  Patient presents with  . Cough   (Consider location/radiation/quality/duration/timing/severity/associated sxs/prior Treatment) Patient is a 25 y.o. female presenting with URI. The history is provided by the patient.  URI Presenting symptoms: congestion, cough, facial pain, fatigue and fever   Severity:  Moderate Onset quality:  Sudden Duration:  7 days Timing:  Constant Progression:  Worsening Chronicity:  New Relieved by:  Nothing Ineffective treatments:  OTC medications Associated symptoms: headaches and sinus pain   Associated symptoms: no wheezing   Risk factors: not elderly, no chronic cardiac disease, no chronic kidney disease, no chronic respiratory disease, no diabetes mellitus, no immunosuppression, no recent illness, no recent travel and no sick contacts     Past Medical History  Diagnosis Date  . Herpes simplex virus infection     1 and 2  . Allergic rhinitis   . Chronic post-traumatic stress disorder (PTSD)   . Reduction defect of left lower extremity     Short L leg- wears lift  . Back pain     3 bulging discs, arthritis and scoliosis 9/15   History reviewed. No pertinent past surgical history. Family History  Problem Relation Age of Onset  . Alcohol abuse Father   . Arthritis Father   . Hyperlipidemia Father   . Hypertension Father   . Thyroid disease Brother    Social History  Substance Use Topics  . Smoking status: Current Every Day Smoker -- 0.33 packs/day    Types: Cigarettes  . Smokeless tobacco: Never Used  . Alcohol Use: Yes     Comment: rare   OB History    No data available     Review of Systems  Constitutional: Positive for fever and fatigue.  HENT: Positive for congestion.   Respiratory: Positive for cough. Negative for wheezing.   Neurological: Positive for headaches.    Allergies   Sertraline hcl and Wellbutrin  Home Medications   Prior to Admission medications   Medication Sig Start Date End Date Taking? Authorizing Provider  azithromycin (ZITHROMAX Z-PAK) 250 MG tablet 2 tabs po once day 1, then 1 tab po qd for the next 4 days 04/16/16   Payton Mccallum, MD  cyclobenzaprine (FLEXERIL) 10 MG tablet Take 1 tablet (10 mg total) by mouth at bedtime. 02/28/16   Megan P Johnson, DO  Etonogestrel (NEXPLANON [place]) Inject into the skin.    Historical Provider, MD   Meds Ordered and Administered this Visit  Medications - No data to display  BP 147/91 mmHg  Pulse 114  Temp(Src) 99.1 F (37.3 C) (Oral)  Resp 16  Ht  (1.651 m)  Wt 270 lb (122.471 kg)  BMI 44.93 kg/m2  SpO2 100% No data found.   Physical Exam  Constitutional: She appears well-developed and well-nourished. No distress.  HENT:  Head: Normocephalic and atraumatic.  Right Ear: Tympanic membrane, external ear and ear canal normal.  Left Ear: Tympanic membrane, external ear and ear canal normal.  Nose: Mucosal edema and rhinorrhea present. No nose lacerations, sinus tenderness, nasal deformity, septal deviation or nasal septal hematoma. No epistaxis.  No foreign bodies. Right sinus exhibits frontal sinus tenderness. Right sinus exhibits no maxillary sinus tenderness. Left sinus exhibits frontal sinus tenderness. Left sinus exhibits no maxillary sinus tenderness.  Mouth/Throat: Uvula is midline, oropharynx is clear and moist and mucous membranes  are normal. No oropharyngeal exudate.  Eyes: Conjunctivae and EOM are normal. Pupils are equal, round, and reactive to light. Right eye exhibits no discharge. Left eye exhibits no discharge. No scleral icterus.  Neck: Normal range of motion. Neck supple. No thyromegaly present.  Cardiovascular: Normal rate, regular rhythm and normal heart sounds.   Pulmonary/Chest: Effort normal and breath sounds normal. No respiratory distress. She has no wheezes. She has no rales.   Lymphadenopathy:    She has no cervical adenopathy.  Skin: She is not diaphoretic.  Nursing note and vitals reviewed.   ED Course  Procedures (including critical care time)  Labs Review Labs Reviewed - No data to display  Imaging Review No results found.   Visual Acuity Review  Right Eye Distance:   Left Eye Distance:   Bilateral Distance:    Right Eye Near:   Left Eye Near:    Bilateral Near:         MDM   1. Acute frontal sinusitis, recurrence not specified   2. Cough    New Prescriptions   AZITHROMYCIN (ZITHROMAX Z-PAK) 250 MG TABLET    2 tabs po once day 1, then 1 tab po qd for the next 4 days   1. diagnosis reviewed with patient 2. rx as per orders above; reviewed possible side effects, interactions, risks and benefits  3. Follow-up prn if symptoms worsen or don't  improve    Payton Mccallumrlando Charistopher Rumble, MD 04/16/16 1222

## 2016-04-16 NOTE — Discharge Instructions (Signed)

## 2016-04-16 NOTE — ED Notes (Signed)
Patient c/o cough and chest congestion for a week.  

## 2016-05-20 ENCOUNTER — Encounter: Payer: Self-pay | Admitting: Family Medicine

## 2016-05-20 ENCOUNTER — Ambulatory Visit (INDEPENDENT_AMBULATORY_CARE_PROVIDER_SITE_OTHER): Payer: BLUE CROSS/BLUE SHIELD | Admitting: Family Medicine

## 2016-05-20 VITALS — BP 103/69 | HR 84 | Temp 98.3°F | Ht 64.4 in | Wt 270.0 lb

## 2016-05-20 DIAGNOSIS — M9902 Segmental and somatic dysfunction of thoracic region: Secondary | ICD-10-CM

## 2016-05-20 DIAGNOSIS — M9908 Segmental and somatic dysfunction of rib cage: Secondary | ICD-10-CM

## 2016-05-20 DIAGNOSIS — M545 Low back pain, unspecified: Secondary | ICD-10-CM

## 2016-05-20 DIAGNOSIS — M9900 Segmental and somatic dysfunction of head region: Secondary | ICD-10-CM | POA: Diagnosis not present

## 2016-05-20 DIAGNOSIS — M9901 Segmental and somatic dysfunction of cervical region: Secondary | ICD-10-CM | POA: Diagnosis not present

## 2016-05-20 DIAGNOSIS — M9904 Segmental and somatic dysfunction of sacral region: Secondary | ICD-10-CM

## 2016-05-20 DIAGNOSIS — M9905 Segmental and somatic dysfunction of pelvic region: Secondary | ICD-10-CM | POA: Diagnosis not present

## 2016-05-20 DIAGNOSIS — M9903 Segmental and somatic dysfunction of lumbar region: Secondary | ICD-10-CM | POA: Diagnosis not present

## 2016-05-20 NOTE — Assessment & Plan Note (Signed)
In exacerbation as her heel lift has been worn out a bit with use. This seems to be myofascial and structural in nature and acute. She does have some somatic dysfunction that I think would benefit from OMT. Treated today as discussed below.  

## 2016-05-20 NOTE — Progress Notes (Signed)
BP 103/69 (BP Location: Left Arm, Patient Position: Sitting, Cuff Size: Large)   Pulse 84   Temp 98.3 F (36.8 C)   Ht 5' 4.4" (1.636 m)   Wt 270 lb (122.5 kg)   SpO2 98%   BMI 45.77 kg/m    Subjective:    Patient ID: Alexa Diaz, female    DOB: [birth year], 25 y.o.   MRN: [id number]  HPI: Alexa Diaz is a 25 y.o. female  Chief Complaint  Patient presents with  . OMM   Alexa Diaz notes that she is not doing well today. Her heel lift has been getting worn down and she hasn't been using as much any more and it has been making a big difference. She notes that her low back has been tight and aching and radiating into her bottom. She also notes that her shoulders and neck have been really tight which she thinks is from carrying trays. This is chronic, but has been worse for about the past 2 weeks since she has been under additional stress. She is otherwise feeling well with no other concerns or complaints. She does find OMT very helpful and it usually lasts for about 2-3 weeks before it starts to wear off.   Relevant past medical, surgical, family and social history reviewed and updated as indicated. Interim medical history since our last visit reviewed. Allergies and medications reviewed and updated.  Review of Systems  Constitutional: Negative.   Respiratory: Negative.   Cardiovascular: Negative.   Musculoskeletal: Positive for back pain, myalgias, neck pain and neck stiffness. Negative for arthralgias, gait problem and joint swelling.  Neurological: Negative.   Psychiatric/Behavioral: Negative.    Per HPI unless specifically indicated above     Objective:    BP 103/69 (BP Location: Left Arm, Patient Position: Sitting, Cuff Size: Large)   Pulse 84   Temp 98.3 F (36.8 C)   Ht 5' 4.4" (1.636 m)   Wt 270 lb (122.5 kg)   SpO2 98%   BMI 45.77 kg/m   Wt Readings from Last 3 Encounters:  05/20/16 270 lb (122.5 kg)  04/16/16 270 lb (122.5 kg)  04/14/16 272  lb (123.4 kg)    Physical Exam  Constitutional: She is oriented to person, place, and time. She appears well-developed and well-nourished. No distress.  HENT:  Head: Normocephalic and atraumatic.  Right Ear: Hearing normal.  Left Ear: Hearing normal.  Nose: Nose normal.  Eyes: Conjunctivae and lids are normal. Right eye exhibits no discharge. Left eye exhibits no discharge. No scleral icterus.  Pulmonary/Chest: Effort normal. No respiratory distress.  Abdominal: Soft. She exhibits no distension and no mass. There is no tenderness. There is no rebound and no guarding.  Musculoskeletal: Normal range of motion.  Neurological: She is alert and oriented to person, place, and time.  Skin: Skin is warm, dry and intact. No rash noted. No erythema. No pallor.  Psychiatric: She has a normal mood and affect. Her speech is normal and behavior is normal. Judgment and thought content normal. Cognition and memory are normal.  Nursing note and vitals reviewed. Musculoskeletal:  Exam found Decreased ROM, Tissue texture changes, Tenderness to palpation and Asymmetry of patient's  head, neck, thorax, ribs, lumbar, pelvis and sacrum Osteopathic Structural Exam:   Head: hypertonic suboccipital muscles, OAESSL, SRR  Neck: SCM hypertonic bilaterally R>L, C3ESRR, C4ESRL  Thorax: T5-9SRRL, trap spasm on the R  Ribs: Ribs 5-8 locked up on the R  Lumbar: QL hypertonic on the R,  L3-5SLRR  Pelvis: Anterior L innominate  Sacrum: L on L torsion        Assessment & Plan:   Problem List Items Addressed This Visit      Other   Low back pain - Primary    In exacerbation as her heel lift has been worn out a bit with use. This seems to be myofascial and structural in nature and acute. She does have some somatic dysfunction that I think would benefit from OMT. Treated today as discussed below.             Other Visit Diagnoses    Cranial somatic dysfunction       Lumbar region somatic dysfunction        Rib cage region somatic dysfunction       Somatic dysfunction of spine, cervical       Thoracic region somatic dysfunction       Pelvic region somatic dysfunction       Sacral region somatic dysfunction         After verbal consent was obtained, patient was treated today with osteopathic manipulative medicine to the regions of the head, neck, thorax, ribs, lumbar, pelvis and sacrum using the techniques of myofascial release, counterstrain, muscle energy, HVLA and soft tissue. Areas of compensation relating to her primary pain source also treated. Patient tolerated the procedure well with good objective and good subjective improvement in symptoms. She left the room in good condition. She was advised to stay well hydrated and that she may have some soreness following the procedure. If not improving or worsening, she will call and come in. Home exercise program of stretches for neck discussed and demonstrated today. Patient will do these stretches BID to before the point of pain, and will return for reevaluation  in 1-2 months.   Follow up plan: Return in about 4 weeks (around 06/17/2016) for OMM eval.

## 2016-07-17 ENCOUNTER — Encounter: Payer: Self-pay | Admitting: Family Medicine

## 2016-07-17 ENCOUNTER — Ambulatory Visit (INDEPENDENT_AMBULATORY_CARE_PROVIDER_SITE_OTHER): Payer: BLUE CROSS/BLUE SHIELD | Admitting: Family Medicine

## 2016-07-17 VITALS — BP 107/71 | HR 73 | Temp 98.9°F | Wt 277.4 lb

## 2016-07-17 DIAGNOSIS — M9908 Segmental and somatic dysfunction of rib cage: Secondary | ICD-10-CM

## 2016-07-17 DIAGNOSIS — M2170 Unequal limb length (acquired), unspecified site: Secondary | ICD-10-CM | POA: Diagnosis not present

## 2016-07-17 DIAGNOSIS — M9903 Segmental and somatic dysfunction of lumbar region: Secondary | ICD-10-CM

## 2016-07-17 DIAGNOSIS — M9902 Segmental and somatic dysfunction of thoracic region: Secondary | ICD-10-CM | POA: Diagnosis not present

## 2016-07-17 DIAGNOSIS — M9900 Segmental and somatic dysfunction of head region: Secondary | ICD-10-CM

## 2016-07-17 DIAGNOSIS — M545 Low back pain, unspecified: Secondary | ICD-10-CM

## 2016-07-17 DIAGNOSIS — M9901 Segmental and somatic dysfunction of cervical region: Secondary | ICD-10-CM

## 2016-07-17 DIAGNOSIS — M9904 Segmental and somatic dysfunction of sacral region: Secondary | ICD-10-CM

## 2016-07-17 DIAGNOSIS — M9905 Segmental and somatic dysfunction of pelvic region: Secondary | ICD-10-CM | POA: Diagnosis not present

## 2016-07-17 NOTE — Assessment & Plan Note (Signed)
Patient seems to have leg-length discrepancy that is doing very well with her heel lift, currently acting up because her heel lift has worn out.  The rest of her pain seems to be due to functional and mechanical changes. I think that she would benefit from OMT. She was treated today as discussed below.

## 2016-07-17 NOTE — Progress Notes (Signed)
BP 107/71 (BP Location: Left Arm, Patient Position: Sitting, Cuff Size: Large)   Pulse 73   Temp 98.9 F (37.2 C)   Wt 277 lb 6.4 oz (125.8 kg)   SpO2 97%   BMI 47.03 kg/m    Subjective:    Patient ID: Alexa Diaz, female    DOB: Aug 19, 1991, 25 y.o.   MRN: [id number]  HPI: Alexa Diaz is a 25 y.o. female  Chief Complaint  Patient presents with  . Other    omm   Nanna presents today for reevaluation of her low back pain. She states that she has not been doing well for the past month and should have come in earlier. She notes that her has not been able to wear her heel lift because it wore out and notes that she is much worse since then. Pain is in her low back. Aching and tight and moderate. Better with OMT, worse with stress and working and carrying her daughter. Some radiation into her thigh. She is otherwise doing well with not other concerns or complaints at this time.   Relevant past medical, surgical, family and social history reviewed and updated as indicated. Interim medical history since our last visit reviewed. Allergies and medications reviewed and updated.  Review of Systems  Constitutional: Negative.   Respiratory: Negative.   Cardiovascular: Negative.   Musculoskeletal: Positive for arthralgias, back pain, gait problem, myalgias, neck pain and neck stiffness. Negative for joint swelling.  Psychiatric/Behavioral: Negative.     Per HPI unless specifically indicated above     Objective:    BP 107/71 (BP Location: Left Arm, Patient Position: Sitting, Cuff Size: Large)   Pulse 73   Temp 98.9 F (37.2 C)   Wt 277 lb 6.4 oz (125.8 kg)   SpO2 97%   BMI 47.03 kg/m   Wt Readings from Last 3 Encounters:  07/17/16 277 lb 6.4 oz (125.8 kg)  05/20/16 270 lb (122.5 kg)  04/16/16 270 lb (122.5 kg)    Physical Exam  Constitutional: She is oriented to person, place, and time. She appears well-developed and well-nourished. No distress.  HENT:    Head: Normocephalic and atraumatic.  Right Ear: Hearing normal.  Left Ear: Hearing normal.  Nose: Nose normal.  Eyes: Conjunctivae and lids are normal. Right eye exhibits no discharge. Left eye exhibits no discharge. No scleral icterus.  Pulmonary/Chest: Effort normal. No respiratory distress.  Abdominal: Soft. She exhibits no distension and no mass. There is no tenderness. There is no rebound and no guarding.  Neurological: She is alert and oriented to person, place, and time.  Skin: Skin is warm, dry and intact. No rash noted. No erythema. No pallor.  Psychiatric: She has a normal mood and affect. Her speech is normal and behavior is normal. Judgment and thought content normal. Cognition and memory are normal.  Nursing note and vitals reviewed. Musculoskeletal:  Exam found Decreased ROM, Tissue texture changes, Tenderness to palpation and Asymmetry of patient's  head, neck, thorax, ribs, lumbar, pelvis and sacrum Osteopathic Structural Exam:   Head: hypertonic suboccipital muscles  Neck: C4ESRR, C6ESRL  Thorax: T3-5SLRR  Ribs: Ribs 5-9 locked up on the L, Ribs 5-6 locked up on the R  Lumbar: QL hypertonic on the R, Psoas spasm on the R  Pelvis: Anterior R innominate  Sacrum: R on R torsion   Results for orders placed or performed in visit on 05/06/14  GC/Chlamydia Probe Amp  Result Value Ref Range   Micro  Text Report         SOURCE: 1    CHLAMYDIA                 CHLAMYDIA TRACHOMATIS NEGATIVE   N.GONORRHOEAE             N.GONORRHOEAE NEGATIVE   ANTIBIOTIC                                                      CBC with Differential/Platelet  Result Value Ref Range   WBC 12.6 (H) 3.6 - 11.0 x10 3/mm 3   RBC 3.77 (L) 3.80 - 5.20 X10 6/mm 3   HGB 10.8 (L) 12.0 - 16.0 g/dL   HCT 16.1 (L) 09.6 - 04.5 %   MCV 89 80 - 100 fL   MCH 28.7 26.0 - 34.0 pg   MCHC 32.3 32.0 - 36.0 g/dL   RDW 40.9 81.1 - 91.4 %   Platelet 171 150 - 440 x10 3/mm 3   Neutrophil % 76.5 %   Lymphocyte %  17.1 %   Monocyte % 5.7 %   Eosinophil % 0.6 %   Basophil % 0.1 %   Neutrophil # 9.7 (H) 1.4 - 6.5 x10 3/mm 3   Lymphocyte # 2.2 1.0 - 3.6 x10 3/mm 3   Monocyte # 0.7 0.2 - 0.9 x10 3/mm    Eosinophil # 0.1 0.0 - 0.7 x10 3/mm 3   Basophil # 0.0 0.0 - 0.1 x10 3/mm 3  Drug Screen, Urine  Result Value Ref Range   Tricyclic, Ur Screen NEGATIVE Cutoff-1000 ng/mL   Amphetamines, Ur Screen NEGATIVE Cutoff-1000 ng/mL   MDMA (Ecstasy)Ur Screen NEGATIVE Cutoff-500 ng/mL   Cocaine Metabolite,Ur [place] NEGATIVE Cutoff-300 ng/mL   Opiate, Ur Screen NEGATIVE Cutoff-300 ng/mL   Phencyclidine (PCP) Ur S NEGATIVE Cutoff-25 ng/mL   Cannabinoid 50 Ng, Ur [place] NEGATIVE Cutoff-50 ng/mL   Barbiturates, Ur Screen NEGATIVE Cutoff-200 ng/mL   Methadone, Ur Screen NEGATIVE Cutoff-300 ng/mL   Benzodiazepine, Ur Scrn NEGATIVE Cutoff-200 ng/mL  Hematocrit  Result Value Ref Range   HCT 26.0 (L) 35.0 - 47.0 %      Assessment & Plan:   Problem List Items Addressed This Visit      Other   Leg length discrepancy    Patient seems to have leg-length discrepancy that is doing very well with her heel lift, currently acting up because her heel lift has worn out.  The rest of her pain seems to be due to functional and mechanical changes. I think that she would benefit from OMT. She was treated today as discussed below.         Low back pain - Primary    In exacerbation as her heel lift has been worn out a bit with use. This seems to be myofascial and structural in nature and acute. She does have some somatic dysfunction that I think would benefit from OMT. Treated today as discussed below.             Other Visit Diagnoses    Cranial somatic dysfunction       Lumbar region somatic dysfunction       Rib cage region somatic dysfunction       Somatic dysfunction of spine, cervical       Thoracic region somatic dysfunction  Pelvic region somatic dysfunction       Sacral region somatic dysfunction          After verbal consent was obtained, patient was treated today with osteopathic manipulative medicine to the regions of the head, neck, thorax, ribs, lumbar, pelvis and sacrum using the techniques of myofascial release, counterstrain, muscle energy, HVLA and soft tissue. Areas of compensation relating to her primary pain source also treated. Patient tolerated the procedure well with good objective and good subjective improvement in symptoms. She left the room in good condition. She was advised to stay well hydrated and that she may have some soreness following the procedure. If not improving or worsening, she will call and come in.  She will return for reevaluation  in 1-2 months.    Follow up plan: Return When heel lift comes in for fitting.

## 2016-07-17 NOTE — Assessment & Plan Note (Signed)
In exacerbation as her heel lift has been worn out a bit with use. This seems to be myofascial and structural in nature and acute. She does have some somatic dysfunction that I think would benefit from OMT. Treated today as discussed below.  

## 2016-08-21 ENCOUNTER — Telehealth: Payer: Self-pay | Admitting: Family Medicine

## 2016-08-21 NOTE — Telephone Encounter (Signed)
Pt called to see if her heel lifts had come in yet. Please call pt to notify. Thanks.

## 2016-08-21 NOTE — Telephone Encounter (Signed)
They are here, but it's best we treat her before we put them on. So have her come in for an appointment please.

## 2016-08-21 NOTE — Telephone Encounter (Signed)
Does the patient need an appointment or does she just need to come get them or get them fitted?

## 2016-08-21 NOTE — Telephone Encounter (Signed)
Patient scheduled for November 13.

## 2016-09-08 ENCOUNTER — Ambulatory Visit (INDEPENDENT_AMBULATORY_CARE_PROVIDER_SITE_OTHER): Payer: BLUE CROSS/BLUE SHIELD | Admitting: Family Medicine

## 2016-09-08 ENCOUNTER — Telehealth: Payer: Self-pay | Admitting: Family Medicine

## 2016-09-08 ENCOUNTER — Encounter: Payer: Self-pay | Admitting: Family Medicine

## 2016-09-08 VITALS — BP 120/79 | HR 73 | Temp 98.1°F | Wt 278.0 lb

## 2016-09-08 DIAGNOSIS — M545 Low back pain, unspecified: Secondary | ICD-10-CM

## 2016-09-08 DIAGNOSIS — M9905 Segmental and somatic dysfunction of pelvic region: Secondary | ICD-10-CM

## 2016-09-08 DIAGNOSIS — M2170 Unequal limb length (acquired), unspecified site: Secondary | ICD-10-CM | POA: Diagnosis not present

## 2016-09-08 DIAGNOSIS — M9901 Segmental and somatic dysfunction of cervical region: Secondary | ICD-10-CM

## 2016-09-08 DIAGNOSIS — M9903 Segmental and somatic dysfunction of lumbar region: Secondary | ICD-10-CM

## 2016-09-08 DIAGNOSIS — M9904 Segmental and somatic dysfunction of sacral region: Secondary | ICD-10-CM

## 2016-09-08 DIAGNOSIS — G8929 Other chronic pain: Secondary | ICD-10-CM | POA: Diagnosis not present

## 2016-09-08 DIAGNOSIS — M9902 Segmental and somatic dysfunction of thoracic region: Secondary | ICD-10-CM

## 2016-09-08 DIAGNOSIS — M9908 Segmental and somatic dysfunction of rib cage: Secondary | ICD-10-CM | POA: Diagnosis not present

## 2016-09-08 MED ORDER — CYCLOBENZAPRINE HCL 10 MG PO TABS: 10.0000 mg | ORAL_TABLET | Freq: Every day | ORAL | 1 refills | Status: DC

## 2016-09-08 NOTE — Assessment & Plan Note (Signed)
Patient seems to have leg-length discrepancy that is doing very well with her heel lift, currently acting up because her heel lift has worn out.  The rest of her pain seems to be due to functional and mechanical changes. New heel lift fit today. I think that she would benefit from OMT. She was treated today as discussed below.

## 2016-09-08 NOTE — Progress Notes (Signed)
BP 120/79 (BP Location: Left Arm, Patient Position: Sitting, Cuff Size: Large)   Pulse 73   Temp 98.1 F (36.7 C)   Wt 278 lb (126.1 kg)   SpO2 99%   BMI 47.13 kg/m    Subjective:    Patient ID: Alexa Diaz, female    DOB: 04-30-1991, 25 y.o.   MRN: [id number]  HPI: Alexa Diaz is a 25 y.o. female  Chief Complaint  Patient presents with  . OMM   Alexa Diaz presents today complaining of low back pain which has started to radiate down her leg. She has not been wearing her heel lift because she wore it out. She is very sore and feels significantly worse than she usually does. Her pain has been increasing since her last visit. It is located in her lower back bilaterally. It is aching and tight and throbbing. It is better with OMT and muscle relaxer. It is worse with working, carrying her daughter and being on her feet. It has been radiating down her L leg. She is otherwise doing OK with no other concerns or complaints at this time.   Relevant past medical, surgical, family and social history reviewed and updated as indicated. Interim medical history since our last visit reviewed. Allergies and medications reviewed and updated.  Review of Systems  Constitutional: Negative.   Respiratory: Negative.   Cardiovascular: Negative.   Musculoskeletal: Positive for arthralgias, back pain, gait problem and myalgias. Negative for joint swelling, neck pain and neck stiffness.  Psychiatric/Behavioral: Negative.     Per HPI unless specifically indicated above     Objective:    BP 120/79 (BP Location: Left Arm, Patient Position: Sitting, Cuff Size: Large)   Pulse 73   Temp 98.1 F (36.7 C)   Wt 278 lb (126.1 kg)   SpO2 99%   BMI 47.13 kg/m   Wt Readings from Last 3 Encounters:  09/08/16 278 lb (126.1 kg)  07/17/16 277 lb 6.4 oz (125.8 kg)  05/20/16 270 lb (122.5 kg)    Physical Exam  Constitutional: She is oriented to person, place, and time. She appears  well-developed and well-nourished. No distress.  HENT:  Head: Normocephalic and atraumatic.  Right Ear: Hearing normal.  Left Ear: Hearing normal.  Nose: Nose normal.  Eyes: Conjunctivae and lids are normal. Right eye exhibits no discharge. Left eye exhibits no discharge. No scleral icterus.  Pulmonary/Chest: Effort normal. No respiratory distress.  Abdominal: Soft. She exhibits no distension and no mass. There is no tenderness. There is no rebound and no guarding.  Neurological: She is alert and oriented to person, place, and time.  Skin: Skin is warm, dry and intact. No rash noted. No erythema. No pallor.  Psychiatric: She has a normal mood and affect. Her speech is normal and behavior is normal. Judgment and thought content normal. Cognition and memory are normal.  Musculoskeletal:  Exam found Decreased ROM, Tissue texture changes, Tenderness to palpation and Asymmetry of patient's  neck, thorax, ribs, lumbar, pelvis and sacrum Osteopathic Structural Exam:   Neck: C4ESRR, C3ESRL  Thorax: T3-5SLRR  Ribs: Rib 6 locked up on the R, Ribs 4-7 locked up on the L  Lumbar: L3-5SLRR  Pelvis: Posterior L innominate  Sacrum: L on L torsion    Results for orders placed or performed in visit on 05/06/14  GC/Chlamydia Probe Amp  Result Value Ref Range   Micro Text Report         SOURCE: 1    CHLAMYDIA  CHLAMYDIA TRACHOMATIS NEGATIVE   N.GONORRHOEAE             N.GONORRHOEAE NEGATIVE   ANTIBIOTIC                                                      CBC with Differential/Platelet  Result Value Ref Range   WBC 12.6 (H) 3.6 - 11.0 x10 3/mm 3   RBC 3.77 (L) 3.80 - 5.20 X10 6/mm 3   HGB 10.8 (L) 12.0 - 16.0 g/dL   HCT 16.133.5 (L) 09.635.0 - 04.547.0 %   MCV 89 80 - 100 fL   MCH 28.7 26.0 - 34.0 pg   MCHC 32.3 32.0 - 36.0 g/dL   RDW 40.914.4 81.111.5 - 91.414.5 %   Platelet 171 150 - 440 x10 3/mm 3   Neutrophil % 76.5 %   Lymphocyte % 17.1 %   Monocyte % 5.7 %   Eosinophil % 0.6 %   Basophil  % 0.1 %   Neutrophil # 9.7 (H) 1.4 - 6.5 x10 3/mm 3   Lymphocyte # 2.2 1.0 - 3.6 x10 3/mm 3   Monocyte # 0.7 0.2 - 0.9 x10 3/mm    Eosinophil # 0.1 0.0 - 0.7 x10 3/mm 3   Basophil # 0.0 0.0 - 0.1 x10 3/mm 3  Drug Screen, Urine  Result Value Ref Range   Tricyclic, Ur Screen NEGATIVE Cutoff-1000 ng/mL   Amphetamines, Ur Screen NEGATIVE Cutoff-1000 ng/mL   MDMA (Ecstasy)Ur Screen NEGATIVE Cutoff-500 ng/mL   Cocaine Metabolite,Ur [place] NEGATIVE Cutoff-300 ng/mL   Opiate, Ur Screen NEGATIVE Cutoff-300 ng/mL   Phencyclidine (PCP) Ur S NEGATIVE Cutoff-25 ng/mL   Cannabinoid 50 Ng, Ur [place] NEGATIVE Cutoff-50 ng/mL   Barbiturates, Ur Screen NEGATIVE Cutoff-200 ng/mL   Methadone, Ur Screen NEGATIVE Cutoff-300 ng/mL   Benzodiazepine, Ur Scrn NEGATIVE Cutoff-200 ng/mL  Hematocrit  Result Value Ref Range   HCT 26.0 (L) 35.0 - 47.0 %      Assessment & Plan:   Problem List Items Addressed This Visit      Other   Leg length discrepancy    Patient seems to have leg-length discrepancy that is doing very well with her heel lift, currently acting up because her heel lift has worn out.  The rest of her pain seems to be due to functional and mechanical changes. New heel lift fit today. I think that she would benefit from OMT. She was treated today as discussed below.         Low back pain - Primary    In exacerbation as her heel lift has been worn out a bit with use. This seems to be myofascial and structural in nature and acute. She does have some somatic dysfunction that I think would benefit from OMT. Treated today as discussed below.       Relevant Medications   cyclobenzaprine (FLEXERIL) 10 MG tablet    Other Visit Diagnoses    Lumbar region somatic dysfunction       Rib cage region somatic dysfunction       Somatic dysfunction of spine, cervical       Thoracic region somatic dysfunction       Pelvic region somatic dysfunction       Sacral region somatic dysfunction        After verbal  consent was obtained, patient was treated today with osteopathic manipulative medicine to the regions of the neck, thorax, ribs, lumbar, pelvis and sacrum using the techniques of myofascial release, counterstrain, muscle energy, HVLA and soft tissue. Areas of compensation relating to her primary pain source also treated. Patient tolerated the procedure well with good objective and good subjective improvement in symptoms. She left the room in good condition. She was advised to stay well hydrated and that she may have some soreness following the procedure. If not improving or worsening, he will call and come in. She will return for reevaluation  In 3-4 weeks.    Follow up plan: Return in about 4 weeks (around 10/06/2016) for OMM.

## 2016-09-08 NOTE — Assessment & Plan Note (Signed)
In exacerbation as her heel lift has been worn out a bit with use. This seems to be myofascial and structural in nature and acute. She does have some somatic dysfunction that I think would benefit from OMT. Treated today as discussed below.

## 2016-09-08 NOTE — Telephone Encounter (Signed)
Pt stated she was supposed to get an RX for a muscle relaxer. [facility] Thanks.

## 2016-10-10 ENCOUNTER — Ambulatory Visit: Payer: BLUE CROSS/BLUE SHIELD | Admitting: Family Medicine

## 2016-10-16 ENCOUNTER — Ambulatory Visit
Admission: RE | Admit: 2016-10-16 | Discharge: 2016-10-16 | Disposition: A | Discharge status: Home / Self Care | Payer: 59 | Source: Ambulatory Visit | Attending: Family Medicine | Admitting: Family Medicine

## 2016-10-16 ENCOUNTER — Ambulatory Visit (INDEPENDENT_AMBULATORY_CARE_PROVIDER_SITE_OTHER): Payer: 59 | Admitting: Family Medicine

## 2016-10-16 ENCOUNTER — Encounter (INDEPENDENT_AMBULATORY_CARE_PROVIDER_SITE_OTHER): Payer: Self-pay

## 2016-10-16 ENCOUNTER — Encounter: Payer: Self-pay | Admitting: Family Medicine

## 2016-10-16 VITALS — BP 121/78 | HR 55 | Temp 98.3°F | Wt 280.0 lb

## 2016-10-16 DIAGNOSIS — M79672 Pain in left foot: Secondary | ICD-10-CM

## 2016-10-16 MED ORDER — PREDNISONE 20 MG PO TABS: 40.0000 mg | ORAL_TABLET | Freq: Every day | ORAL | 0 refills | Status: DC

## 2016-10-16 NOTE — Progress Notes (Signed)
BP 121/78 (BP Location: Left Arm, Patient Position: Sitting, Cuff Size: Large)   Pulse (!) 55   Temp 98.3 F (36.8 C)   Wt 280 lb (127 kg)   SpO2 99%   BMI 47.47 kg/m    Subjective:    Patient ID: Alexa Diaz, female    DOB: 03/26/1991, 25 y.o.   MRN: [id number]  HPI: Alexa Diaz is a [age] y.o. female  Chief Complaint  Patient presents with  . Foot Pain    pt states her left heel has been bothering her for about 6 months. 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 Pain has been constant and worsening since summer. States lateral heel and lateral to 5th toe are mostly where the pain is located. Pain is sharp with weight bearing and dull ache with rest. Has tried tylenol and ibuprofen with minimal relief. Started wearing a support wrap recently but that has not seemed to help either. Does wear a heel lift due to left leg being shorter, but that has not seemed to help. Denies previous injury, edema, redness, or wounds on that foot.   Relevant past medical, surgical, family and social history reviewed and updated as indicated. Interim medical history since our last visit reviewed. Allergies and medications reviewed and updated.  Review of Systems  Constitutional: Negative.   HENT: Negative.   Eyes: Negative.   Respiratory: Negative.   Cardiovascular: Negative.   Gastrointestinal: Negative.   Genitourinary: Negative.   Musculoskeletal: Positive for arthralgias and gait problem.  Psychiatric/Behavioral: Negative.     Per HPI unless specifically indicated above     Objective:    BP 121/78 (BP Location: Left Arm, Patient Position: Sitting, Cuff Size: Large)   Pulse (!) 55   Temp 98.3 F (36.8 C)   Wt 280 lb (127 kg)   SpO2 99%    BMI 47.47 kg/m   Wt Readings from Last 3 Encounters:  10/16/16 280 lb (127 kg)  09/08/16 278 lb (126.1 kg)  07/17/16 277 lb 6.4 oz (125.8 kg)    Physical Exam  Constitutional: She is oriented to person, place, and time. She appears well-developed and well-nourished. No distress.  HENT:  Head: Atraumatic.  Eyes: Conjunctivae are normal. Pupils are equal, round, and reactive to light.  Neck: Normal range of motion. Neck supple.  Cardiovascular: Normal rate, regular rhythm and normal heart sounds.   Pulmonary/Chest: Effort normal and breath sounds normal. No respiratory distress.  Musculoskeletal: Normal range of motion.  Antalgic gait. Minimal ttp lateral heel and lateral to 5th toe. No edema or redness noted ROM intact  Neurological: She is alert and oriented to person, place, and time.  Skin: Skin is warm and dry.  Psychiatric: She has a normal mood and affect. Her behavior is normal.  Nursing note and vitals reviewed.     Assessment & Plan:   Problem List Items Addressed This Visit    None    Visit Diagnoses    Left foot pain    -  Primary   Await left foot x-ray. Will try prednisone burst and tylenol prn. Epsom salt soaks, massage, heat, good supportive shoes. Follow up if no improvement in 1 wk  Relevant Orders   DG Foot Complete Left (Completed)       Follow up plan: Return if symptoms worsen or fail to improve.

## 2016-10-16 NOTE — Patient Instructions (Signed)
Follow up in 1 week via mychart for an update on how left foot is doing

## 2016-10-17 ENCOUNTER — Telehealth: Payer: Self-pay | Admitting: Family Medicine

## 2016-10-17 NOTE — Telephone Encounter (Signed)
Please call pt and let her know that her foot x-ray came back normal, no signs of a bone spur or fracture. Let's try the prednisone and see how that goes. Remind her to [facility] [facility] message me in about a week to check in regarding symptoms. Thanks

## 2016-10-17 NOTE — Telephone Encounter (Signed)
Patient notified

## 2016-10-17 NOTE — Telephone Encounter (Signed)
Left message for patient to return my call.

## 2016-10-28 ENCOUNTER — Telehealth: Payer: Self-pay | Admitting: Family Medicine

## 2016-10-28 NOTE — Telephone Encounter (Signed)
Patient has pulled muscle in neck that runs in back and is in excruciating pain and has been on heat and taken muscle relaxers.  I explained Dr Shela CommonsJ had no availability but she said the last time this happened Dr Shela CommonsJ told her to call and she would try to work her in.   I told her I would pass this message to Dr Shela CommonsJ  Thank Bonita QuinYou  Clydie BraunKaren

## 2016-10-28 NOTE — Telephone Encounter (Signed)
My 6711 o clock tomorrow will not be coming in- you can put her there.

## 2016-10-29 ENCOUNTER — Encounter: Payer: Self-pay | Admitting: Family Medicine

## 2016-10-29 ENCOUNTER — Ambulatory Visit (INDEPENDENT_AMBULATORY_CARE_PROVIDER_SITE_OTHER): Payer: 59 | Admitting: Family Medicine

## 2016-10-29 VITALS — BP 130/84 | HR 73 | Temp 98.4°F | Wt 284.7 lb

## 2016-10-29 DIAGNOSIS — M436 Torticollis: Secondary | ICD-10-CM | POA: Diagnosis not present

## 2016-10-29 DIAGNOSIS — M9902 Segmental and somatic dysfunction of thoracic region: Secondary | ICD-10-CM

## 2016-10-29 DIAGNOSIS — M9901 Segmental and somatic dysfunction of cervical region: Secondary | ICD-10-CM

## 2016-10-29 DIAGNOSIS — M9900 Segmental and somatic dysfunction of head region: Secondary | ICD-10-CM | POA: Diagnosis not present

## 2016-10-29 NOTE — Progress Notes (Signed)
BP 130/84 (BP Location: Left Arm, Patient Position: Sitting, Cuff Size: Large)   Pulse 73   Temp 98.4 F (36.9 C)   Wt 284 lb 11.2 oz (129.1 kg)   SpO2 100%   BMI 48.26 kg/m    Subjective:    Patient ID: Alexa Diaz, female    DOB: 01-04-1991, 26 y.o.   MRN: [id number]  HPI: Alexa Diaz is a 26 y.o. female  Chief Complaint  Patient presents with  . Neck Pain    Patient states that she had bad next pain over the summer, which got better with OMM. She woke up on Sunday in a lot of pain again on the left side of her neck.    NECK PAIN- started with severe pain in L side of her neck on Sunday. Heat and flexeril seemed to help, but came back Tuesday.  Status: uncontrolled Treatments attempted: rest, heat, ibuprofen and muscle relaxer  Relief with NSAIDs?:  moderate Location:Left Duration:3 days Severity: severe Quality: sharp and pulling Frequency: constant Radiation: headache Aggravating factors: movement Alleviating factors: rest, heat, NSAIDs and muscle relaxer Weakness:  no Paresthesias / decreased sensation:  no  Fevers:  no   Relevant past medical, surgical, family and social history reviewed and updated as indicated. Interim medical history since our last visit reviewed. Allergies and medications reviewed and updated.  Review of Systems  Constitutional: Negative.   Respiratory: Negative.   Cardiovascular: Negative.   Musculoskeletal: Positive for myalgias, neck pain and neck stiffness. Negative for arthralgias, back pain, gait problem and joint swelling.  Neurological: Negative.   Psychiatric/Behavioral: Negative.     Per HPI unless specifically indicated above     Objective:    BP 130/84 (BP Location: Left Arm, Patient Position: Sitting, Cuff Size: Large)   Pulse 73   Temp 98.4 F (36.9 C)   Wt 284 lb 11.2 oz (129.1 kg)   SpO2 100%   BMI 48.26 kg/m   Wt Readings from Last 3 Encounters:  10/29/16 284 lb 11.2 oz (129.1 kg)    10/16/16 280 lb (127 kg)  09/08/16 278 lb (126.1 kg)    Physical Exam  Constitutional: She is oriented to person, place, and time. She appears well-developed and well-nourished. No distress.  HENT:  Head: Normocephalic and atraumatic.  Right Ear: Hearing normal.  Left Ear: Hearing normal.  Nose: Nose normal.  Eyes: Conjunctivae and lids are normal. Right eye exhibits no discharge. Left eye exhibits no discharge. No scleral icterus.  Pulmonary/Chest: Effort normal. No respiratory distress.  Abdominal: Soft. Bowel sounds are normal. She exhibits no distension and no mass. There is no tenderness. There is no rebound and no guarding.  Musculoskeletal: Normal range of motion.  Neurological: She is alert and oriented to person, place, and time.  Skin: Skin is warm, dry and intact. No rash noted. She is not diaphoretic. No erythema. No pallor.  Psychiatric: She has a normal mood and affect. Her speech is normal and behavior is normal. Judgment and thought content normal. Cognition and memory are normal.  Nursing note and vitals reviewed. Musculoskeletal:  Exam found Decreased ROM, Tissue texture changes, Tenderness to palpation and Asymmetry of patient's  head, neck and thorax Osteopathic Structural Exam:   Head: OAESSL, temporal internally rotated and anterior on the L, lateral pterygoid hypertonic on the L, masseter hypertonic on the L  Neck: C3ESRR, C5ESRL, scm hypertonic on the L, scalenes hypertonic on the L  Thorax: T1-3 SRRL  Results for orders  placed or performed in visit on 05/06/14  GC/Chlamydia Probe Amp  Result Value Ref Range   Micro Text Report         SOURCE: 1    CHLAMYDIA                 CHLAMYDIA TRACHOMATIS NEGATIVE   N.GONORRHOEAE             N.GONORRHOEAE NEGATIVE   ANTIBIOTIC                                                      CBC with Differential/Platelet  Result Value Ref Range   WBC 12.6 (H) 3.6 - 11.0 x10 3/mm 3   RBC 3.77 (L) 3.80 - 5.20 X10 6/mm 3    HGB 10.8 (L) 12.0 - 16.0 g/dL   HCT 16.133.5 (L) 09.635.0 - 04.547.0 %   MCV 89 80 - 100 fL   MCH 28.7 26.0 - 34.0 pg   MCHC 32.3 32.0 - 36.0 g/dL   RDW 40.914.4 81.111.5 - 91.414.5 %   Platelet 171 150 - 440 x10 3/mm 3   Neutrophil % 76.5 %   Lymphocyte % 17.1 %   Monocyte % 5.7 %   Eosinophil % 0.6 %   Basophil % 0.1 %   Neutrophil # 9.7 (H) 1.4 - 6.5 x10 3/mm 3   Lymphocyte # 2.2 1.0 - 3.6 x10 3/mm 3   Monocyte # 0.7 0.2 - 0.9 x10 3/mm    Eosinophil # 0.1 0.0 - 0.7 x10 3/mm 3   Basophil # 0.0 0.0 - 0.1 x10 3/mm 3  Drug Screen, Urine  Result Value Ref Range   Tricyclic, Ur Screen NEGATIVE Cutoff-1000 ng/mL   Amphetamines, Ur Screen NEGATIVE Cutoff-1000 ng/mL   MDMA (Ecstasy)Ur Screen NEGATIVE Cutoff-500 ng/mL   Cocaine Metabolite,Ur [place] NEGATIVE Cutoff-300 ng/mL   Opiate, Ur Screen NEGATIVE Cutoff-300 ng/mL   Phencyclidine (PCP) Ur S NEGATIVE Cutoff-25 ng/mL   Cannabinoid 50 Ng, Ur [place] NEGATIVE Cutoff-50 ng/mL   Barbiturates, Ur Screen NEGATIVE Cutoff-200 ng/mL   Methadone, Ur Screen NEGATIVE Cutoff-300 ng/mL   Benzodiazepine, Ur Scrn NEGATIVE Cutoff-200 ng/mL  Hematocrit  Result Value Ref Range   HCT 26.0 (L) 35.0 - 47.0 %      Assessment & Plan:   Problem List Items Addressed This Visit    None    Visit Diagnoses    Torticollis, acute    -  Primary   Significant SD which would benefit from OMT. Treated today as below. Continue flexeril. Continue NSAID. Recheck 1-2 weeks.    Cranial somatic dysfunction       Thoracic region somatic dysfunction       Somatic dysfunction of spine, cervical         After verbal consent was obtained, patient was treated today with osteopathic manipulative medicine to the regions of the head, neck and thorax using the techniques of cranial, myofascial release, counterstrain, muscle energy, HVLA and soft tissue. Areas of compensation relating to her primary pain source also treated. Patient tolerated the procedure well with good objective and good subjective  improvement in symptoms. She left the room in good condition. She was advised to stay well hydrated and that she may have some soreness following the procedure. If not improving or worsening, she will call and come in.  She will return for reevaluation  1-2 weeks given the severity of her discomfort.   Follow up plan: Return 1-2 weeks.

## 2016-11-04 NOTE — Telephone Encounter (Signed)
Patient was seen by Dr. Johnson.  

## 2016-11-07 ENCOUNTER — Ambulatory Visit: Payer: 59 | Admitting: Family Medicine

## 2016-11-17 ENCOUNTER — Ambulatory Visit (INDEPENDENT_AMBULATORY_CARE_PROVIDER_SITE_OTHER): Payer: 59 | Admitting: Family Medicine

## 2016-11-17 ENCOUNTER — Encounter: Payer: Self-pay | Admitting: Family Medicine

## 2016-11-17 VITALS — BP 128/84 | HR 67 | Ht 64.4 in | Wt 282.8 lb

## 2016-11-17 DIAGNOSIS — IMO0001 Reserved for inherently not codable concepts without codable children: Secondary | ICD-10-CM | POA: Insufficient documentation

## 2016-11-17 DIAGNOSIS — Z6841 Body Mass Index (BMI) 40.0 and over, adult: Secondary | ICD-10-CM

## 2016-11-17 DIAGNOSIS — M791 Myalgia, unspecified site: Secondary | ICD-10-CM

## 2016-11-17 DIAGNOSIS — Z0000 Encounter for general adult medical examination without abnormal findings: Secondary | ICD-10-CM

## 2016-11-17 DIAGNOSIS — Z124 Encounter for screening for malignant neoplasm of cervix: Secondary | ICD-10-CM

## 2016-11-17 DIAGNOSIS — E6609 Other obesity due to excess calories: Secondary | ICD-10-CM | POA: Diagnosis not present

## 2016-11-17 DIAGNOSIS — F4312 Post-traumatic stress disorder, chronic: Secondary | ICD-10-CM | POA: Diagnosis not present

## 2016-11-17 DIAGNOSIS — Z13220 Encounter for screening for lipoid disorders: Secondary | ICD-10-CM | POA: Diagnosis not present

## 2016-11-17 NOTE — Assessment & Plan Note (Signed)
Stable off medication. Continue to monitor. Call with any concerns.  

## 2016-11-17 NOTE — Progress Notes (Signed)
BP 128/84 (BP Location: Left Arm, Patient Position: Sitting, Cuff Size: Large)   Pulse 67   Ht 5' 4.4" (1.636 m)   Wt 282 lb 12.8 oz (128.3 kg)   SpO2 100%   BMI 47.94 kg/m    Subjective:    Patient ID: Alexa Diaz, female    DOB: 1991/10/13, 26 y.o.   MRN: [id number]  HPI: Shanyah Gattuso is a 26 y.o. female presenting on 11/17/2016 for comprehensive medical examination. Current medical complaints include:  DEPRESSION Mood status: stable Satisfied with current treatment?: yes Symptom severity: moderate  Duration of current treatment : Not on anything right now Psychotherapy/counseling: yes in the past Depressed mood: yes Anxious mood: yes Anhedonia: no Significant weight loss or gain: no Insomnia: no  Fatigue: yes Feelings of worthlessness or guilt: yes Impaired concentration/indecisiveness: yes Suicidal ideations: no Hopelessness: no Crying spells: no Depression screen [facility] 2/9 11/17/2016 10/01/2015  Decreased Interest 1 1  Down, Depressed, Hopeless 1 1  PHQ - 2 Score 2 2   She currently lives with: parents and daughter Menopausal Symptoms: no  Past Medical History:  Past Medical History:  Diagnosis Date  . Allergic rhinitis   . Back pain    3 bulging discs, arthritis and scoliosis 9/15  . Chronic post-traumatic stress disorder (PTSD)   . Herpes simplex virus infection    1 and 2  . Reduction defect of left lower extremity    Short L leg- wears lift    Surgical History:  History reviewed. No pertinent surgical history.  Medications:  Current Outpatient Prescriptions on File Prior to Visit  Medication Sig  . cyclobenzaprine (FLEXERIL) 10 MG tablet Take 1 tablet (10 mg total) by mouth at bedtime.  . Etonogestrel (NEXPLANON [place]) Inject into the skin.   No current facility-administered medications on file prior to visit.     Allergies:  Allergies  Allergen Reactions  . Sertraline Hcl Other (See Comments)    Not effective  . Wellbutrin  [Bupropion] Rash    Social History:  Social History   Social History  . Marital status: Married    Spouse name: N/A  . Number of children: N/A  . Years of education: N/A   Occupational History  . Not on file.   Social History Main Topics  . Smoking status: Former Smoker    Packs/day: 0.33    Types: Cigarettes    Quit date: 04/15/2016  . Smokeless tobacco: Never Used  . Alcohol use Yes     Comment: rare  . Drug use: Yes    Types: Marijuana  . Sexual activity: Yes    Birth control/ protection: Implant   Other Topics Concern  . Not on file   Social History Narrative  . No narrative on file   History  Smoking Status  . Former Smoker  . Packs/day: 0.33  . Types: Cigarettes  . Quit date: 04/15/2016  Smokeless Tobacco  . Never Used   History  Alcohol Use  . Yes    Comment: rare    Family History:  Family History  Problem Relation Age of Onset  . Alcohol abuse Father   . Arthritis Father   . Hyperlipidemia Father   . Hypertension Father   . Thyroid disease Brother     Past medical history, surgical history, medications, allergies, family history and social history reviewed with patient today and changes made to appropriate areas of the chart.   Review of Systems  Constitutional: Negative.  HENT: Negative.   Eyes: Negative.   Respiratory: Negative.   Cardiovascular: Negative.   Gastrointestinal: Positive for heartburn (Well controlled with OTC meds). Negative for abdominal pain, blood in stool, constipation, diarrhea, melena, nausea and vomiting.  Genitourinary: Negative.   Musculoskeletal: Positive for back pain and myalgias. Negative for falls, joint pain and neck pain.  Skin: Negative.   Neurological: Negative.   Endo/Heme/Allergies: Negative for environmental allergies and polydipsia. Bruises/bleeds easily.  Psychiatric/Behavioral: Positive for depression. Negative for hallucinations, memory loss, substance abuse and suicidal ideas. The patient is  nervous/anxious. The patient does not have insomnia.     All other ROS negative except what is listed above and in the HPI.      Objective:    BP 128/84 (BP Location: Left Arm, Patient Position: Sitting, Cuff Size: Large)   Pulse 67   Ht 5' 4.4" (1.636 m)   Wt 282 lb 12.8 oz (128.3 kg)   SpO2 100%   BMI 47.94 kg/m   Wt Readings from Last 3 Encounters:  11/17/16 282 lb 12.8 oz (128.3 kg)  10/29/16 284 lb 11.2 oz (129.1 kg)  10/16/16 280 lb (127 kg)    Physical Exam  Constitutional: She is oriented to person, place, and time. She appears well-developed and well-nourished. No distress.  HENT:  Head: Normocephalic and atraumatic.  Right Ear: Hearing and external ear normal.  Left Ear: Hearing and external ear normal.  Nose: Nose normal.  Mouth/Throat: Oropharynx is clear and moist. No oropharyngeal exudate.  Eyes: Conjunctivae, EOM and lids are normal. Pupils are equal, round, and reactive to light. Right eye exhibits no discharge. Left eye exhibits no discharge. No scleral icterus.  Neck: Normal range of motion. Neck supple. No JVD present. No tracheal deviation present. No thyromegaly present.  Cardiovascular: Normal rate, regular rhythm, normal heart sounds and intact distal pulses.  Exam reveals no gallop and no friction rub.   No murmur heard. Pulmonary/Chest: Effort normal and breath sounds normal. No stridor. No respiratory distress. She has no wheezes. She has no rales. She exhibits no tenderness. Right breast exhibits no inverted nipple, no mass, no nipple discharge, no skin change and no tenderness. Left breast exhibits no inverted nipple, no mass, no nipple discharge, no skin change and no tenderness. Breasts are symmetrical.  Abdominal: Soft. Bowel sounds are normal. She exhibits no distension and no mass. There is no tenderness. There is no rebound and no guarding. Hernia confirmed negative in the right inguinal area and confirmed negative in the left inguinal area.    Genitourinary: Vagina normal and uterus normal. No labial fusion. There is no rash, tenderness, lesion or injury on the right labia. There is no rash, tenderness, lesion or injury on the left labia. Uterus is not deviated, not enlarged, not fixed and not tender. Cervix exhibits no motion tenderness, no discharge and no friability. Right adnexum displays no mass, no tenderness and no fullness. Left adnexum displays no mass, no tenderness and no fullness. No erythema, tenderness or bleeding in the vagina. No foreign body in the vagina. No signs of injury around the vagina. No vaginal discharge found.  Musculoskeletal: Normal range of motion. She exhibits no edema, tenderness or deformity.  Lymphadenopathy:    She has no cervical adenopathy.       Right: No inguinal adenopathy present.       Left: No inguinal adenopathy present.  Neurological: She is alert and oriented to person, place, and time. She has normal reflexes. She displays normal  reflexes. No cranial nerve deficit. She exhibits normal muscle tone. Coordination normal.  Skin: Skin is warm, dry and intact. No rash noted. She is not diaphoretic. No erythema. No pallor.  Psychiatric: She has a normal mood and affect. Her speech is normal and behavior is normal. Judgment and thought content normal. Cognition and memory are normal.  Nursing note and vitals reviewed.     Assessment & Plan:   Problem List Items Addressed This Visit      Other   Chronic post-traumatic stress disorder (PTSD)    Stable off medication. Continue to monitor. Call with any concerns.       Class 3 obesity due to excess calories without serious comorbidity with body mass index (BMI) of 40.0 to 44.9 in adult (HCC)    A1c 5.6. Continue diet and exercise. Call with any concerns.       Relevant Orders   Bayer DCA Hb A1c Waived   Comprehensive metabolic panel   TSH    Other Visit Diagnoses    Routine general medical examination at a health care facility    -   Primary   Up to date on vaccines. Screening labs checked today. Pap done today. Continue diet and exercise. Call with any concerns.   Relevant Orders   Bayer DCA Hb A1c Waived   CBC with Differential/Platelet   Comprehensive metabolic panel   Lipid Panel w/o Chol/HDL Ratio   TSH   UA/M w/rflx Culture, Routine   VITAMIN D 25 Hydroxy (Vit-D Deficiency, Fractures)   Myalgia       Will check labs. Await results.    Relevant Orders   VITAMIN D 25 Hydroxy (Vit-D Deficiency, Fractures)   Screening for cholesterol level       Will check labs. Await results.    Relevant Orders   Lipid Panel w/o Chol/HDL Ratio   Screening for cervical cancer       Pap done today. Await results.    Relevant Orders   Pap Lb, rfx HPV ASCU       Follow up plan: Return PRN , for OMM.   LABORATORY TESTING:  - Pap smear: pap done  IMMUNIZATIONS:   - Tdap: Tetanus vaccination status reviewed: last tetanus booster within 10 years. - Influenza: Refused - Pneumovax: Refused  PATIENT COUNSELING:   Advised to take 1 mg of folate supplement per day if capable of pregnancy.   Sexuality: Discussed sexually transmitted diseases, partner selection, use of condoms, avoidance of unintended pregnancy  and contraceptive alternatives.   Advised to avoid cigarette smoking.  I discussed with the patient that most people either abstain from alcohol or drink within safe limits (<=14/week and <=4 drinks/occasion for males, <=7/weeks and <= 3 drinks/occasion for females) and that the risk for alcohol disorders and other health effects rises proportionally with the number of drinks per week and how often a drinker exceeds daily limits.  Discussed cessation/primary prevention of drug use and availability of treatment for abuse.   Diet: Encouraged to adjust caloric intake to maintain  or achieve ideal body weight, to reduce intake of dietary saturated fat and total fat, to limit sodium intake by avoiding high sodium foods and  not adding table salt, and to maintain adequate dietary potassium and calcium preferably from fresh fruits, vegetables, and low-fat dairy products.    stressed the importance of regular exercise  Injury prevention: Discussed safety belts, safety helmets, smoke detector, smoking near bedding or upholstery.   Dental health: Discussed importance  of regular tooth brushing, flossing, and dental visits.    NEXT PREVENTATIVE PHYSICAL DUE IN 1 YEAR. Return PRN , for OMM.

## 2016-11-17 NOTE — Assessment & Plan Note (Signed)
A1c 5.6. Continue diet and exercise. Call with any concerns.  

## 2016-11-18 LAB — COMPREHENSIVE METABOLIC PANEL
ALK PHOS: 68 IU/L (ref 39–117)
ALT: 9 IU/L (ref 0–32)
AST: 13 IU/L (ref 0–40)
Albumin/Globulin Ratio: 1.6 (ref 1.2–2.2)
Albumin: 4.2 g/dL (ref 3.5–5.5)
BUN / CREAT RATIO: 21 (ref 9–23)
BUN: 12 mg/dL (ref 6–20)
CHLORIDE: 104 mmol/L (ref 96–106)
CO2: 25 mmol/L (ref 18–29)
CREATININE: 0.57 mg/dL (ref 0.57–1.00)
Calcium: 9 mg/dL (ref 8.7–10.2)
GFR calc Af Amer: 149 mL/min/{1.73_m2} (ref >59)
GFR calc non Af Amer: 129 mL/min/{1.73_m2} (ref >59)
Globulin, Total: 2.6 g/dL (ref 1.5–4.5)
Glucose: 102 mg/dL — ABNORMAL HIGH (ref 65–99)
Potassium: 4.2 mmol/L (ref 3.5–5.2)
Sodium: 142 mmol/L (ref 134–144)
Total Protein: 6.8 g/dL (ref 6.0–8.5)

## 2016-11-18 LAB — UA/M W/RFLX CULTURE, ROUTINE
Bilirubin, UA: NEGATIVE
GLUCOSE, UA: NEGATIVE
Ketones, UA: NEGATIVE
Leukocytes, UA: NEGATIVE
Nitrite, UA: NEGATIVE
PH UA: 6.5 (ref 5.0–7.5)
Protein, UA: NEGATIVE
RBC, UA: NEGATIVE
Specific Gravity, UA: 1.025 (ref 1.005–1.030)
UUROB: 0.2 mg/dL (ref 0.2–1.0)

## 2016-11-18 LAB — CBC WITH DIFFERENTIAL/PLATELET
BASOS ABS: 0 10*3/uL (ref 0.0–0.2)
Basos: 0 %
EOS (ABSOLUTE): 0.1 10*3/uL (ref 0.0–0.4)
Eos: 1 %
Hematocrit: 37 % (ref 34.0–46.6)
Hemoglobin: 11.5 g/dL (ref 11.1–15.9)
Immature Grans (Abs): 0 10*3/uL (ref 0.0–0.1)
Immature Granulocytes: 0 %
LYMPHS ABS: 2.5 10*3/uL (ref 0.7–3.1)
Lymphs: 27 %
MCH: 26.3 pg — AB (ref 26.6–33.0)
MCHC: 31.1 g/dL — AB (ref 31.5–35.7)
MCV: 85 fL (ref 79–97)
MONOS ABS: 0.6 10*3/uL (ref 0.1–0.9)
Monocytes: 6 %
NEUTROS ABS: 6.1 10*3/uL (ref 1.4–7.0)
Neutrophils: 66 %
Platelets: 269 10*3/uL (ref 150–379)
RBC: 4.38 x10E6/uL (ref 3.77–5.28)
RDW: 15.3 % (ref 12.3–15.4)
WBC: 9.3 10*3/uL (ref 3.4–10.8)

## 2016-11-18 LAB — BAYER DCA HB A1C WAIVED: HB A1C: 5.6 % (ref <7.0)

## 2016-11-18 LAB — LIPID PANEL W/O CHOL/HDL RATIO
CHOLESTEROL TOTAL: 190 mg/dL (ref 100–199)
HDL: 38 mg/dL — ABNORMAL LOW (ref >39)
LDL CALC: 118 mg/dL — AB (ref 0–99)
TRIGLYCERIDES: 169 mg/dL — AB (ref 0–149)
VLDL CHOLESTEROL CAL: 34 mg/dL (ref 5–40)

## 2016-11-18 LAB — VITAMIN D 25 HYDROXY (VIT D DEFICIENCY, FRACTURES): Vit D, 25-Hydroxy: 16.2 ng/mL — ABNORMAL LOW (ref 30.0–100.0)

## 2016-11-18 LAB — TSH: TSH: 0.726 u[IU]/mL (ref 0.450–4.500)

## 2016-11-20 ENCOUNTER — Encounter: Payer: Self-pay | Admitting: Family Medicine

## 2016-11-20 ENCOUNTER — Ambulatory Visit (INDEPENDENT_AMBULATORY_CARE_PROVIDER_SITE_OTHER): Payer: 59 | Admitting: Family Medicine

## 2016-11-20 VITALS — BP 112/76 | HR 60 | Temp 98.7°F | Wt 283.0 lb

## 2016-11-20 DIAGNOSIS — M79672 Pain in left foot: Secondary | ICD-10-CM | POA: Diagnosis not present

## 2016-11-20 LAB — PAP LB, RFX HPV ASCU: PAP SMEAR COMMENT: 0

## 2016-11-20 MED ORDER — PREDNISONE 20 MG PO TABS: 40.0000 mg | ORAL_TABLET | Freq: Every day | ORAL | 0 refills | Status: DC

## 2016-11-20 NOTE — Patient Instructions (Signed)
Follow up as needed

## 2016-11-20 NOTE — Progress Notes (Signed)
   BP 112/76   Pulse 60   Temp 98.7 F (37.1 C)   Wt 283 lb (128.4 kg)   SpO2 100%   BMI 47.98 kg/m    Subjective:    Patient ID: Alexa Diaz, female    DOB: 03/27/1991, 26 y.o.   MRN: [id number]  HPI: Alexa Diaz is a 26 y.o. female  Chief Complaint  Patient presents with  . Foot Pain    left foot pain for several months but worse again the last 2 weeks. Prednisone helped alot previously. Starting to hurt bad again, wants to know what else can be done.    Patient presents with several weeks of worsening left foot pain. This issue has been ongoing for 8 or more months, negative x-ray last month and good response to prednisone but after several weeks the pain began returning. 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 On her feet a lot between school, work, and a toddler. Has tried tylenol, ibuprofen, multiple braces, massage, soaks, etc with no relief.   Relevant past medical, surgical, family and social history reviewed and updated as indicated. Interim medical history since our last visit reviewed. Allergies and medications reviewed and updated.  Review of Systems  Constitutional: Negative.   HENT: Negative.   Respiratory: Negative.   Cardiovascular: Negative.   Gastrointestinal: Negative.   Genitourinary: Negative.   Musculoskeletal: Positive for arthralgias. Negative for joint swelling.  Psychiatric/Behavioral: Negative.     Per HPI unless specifically indicated above     Objective:    BP 112/76   Pulse 60   Temp 98.7 F (37.1 C)   Wt 283 lb (128.4 kg)   SpO2 100%   BMI 47.98 kg/m   Wt Readings from Last 3 Encounters:  11/20/16 283 lb (128.4 kg)  11/17/16 282 lb 12.8 oz (128.3 kg)  10/29/16 284 lb 11.2 oz (129.1 kg)    Physical Exam  Constitutional: She is oriented to person, place, and time. She appears well-developed and well-nourished.  HENT:    Head: Atraumatic.  Eyes: Conjunctivae are normal. Pupils are equal, round, and reactive to light.  Neck: Normal range of motion. Neck supple.  Cardiovascular: Normal rate and normal heart sounds.   Pulmonary/Chest: Effort normal. No respiratory distress.  Musculoskeletal: Normal range of motion. She exhibits tenderness (Mildly ttp over lateral and dorsal aspect of foot). She exhibits no edema.  No erythema, wounds, or bruising of left foot  Neurological: She is alert and oriented to person, place, and time.  Skin: Skin is warm and dry.  Psychiatric: She has a normal mood and affect. Her behavior is normal.  Nursing note and vitals reviewed.     Assessment & Plan:   Problem List Items Addressed This Visit    None    Visit Diagnoses    Left foot pain    -  Primary   Will send another round of prednisone for temporary relief so she can work, referral to podiatry made for further evaluation as etiology unclear   Relevant Orders   Ambulatory referral to Podiatry    Suspect related to leg length discrepancy as well as patient's habitus. Continue supports, massage, stretches, etc for comfort.    Follow up plan: Return if symptoms worsen or fail to improve.

## 2016-12-03 ENCOUNTER — Ambulatory Visit: Payer: Self-pay | Admitting: Podiatry

## 2016-12-08 ENCOUNTER — Ambulatory Visit: Payer: 59 | Admitting: Podiatry

## 2016-12-17 ENCOUNTER — Ambulatory Visit (INDEPENDENT_AMBULATORY_CARE_PROVIDER_SITE_OTHER): Payer: 59 | Admitting: Podiatry

## 2016-12-17 VITALS — BP 116/77 | HR 68 | Resp 16

## 2016-12-17 DIAGNOSIS — M722 Plantar fascial fibromatosis: Secondary | ICD-10-CM

## 2016-12-17 MED ORDER — MELOXICAM 15 MG PO TABS: 15.0000 mg | ORAL_TABLET | Freq: Every day | ORAL | 3 refills | Status: DC

## 2016-12-17 MED ORDER — METHYLPREDNISOLONE 4 MG PO TBPK: ORAL_TABLET | ORAL | 0 refills | Status: DC

## 2016-12-17 NOTE — Progress Notes (Signed)
   Subjective:    Patient ID: Rosemary HolmsJennifer Winget, female    DOB: 10/17/1991, 26 y.o.   MRN: [id number]  HPI she presents today as a 26 year old female with chief complaint of pain to her left heel. She states has been present for the past 6 or 7 months and feels like a sharp shooting stabbing pain through her heel. She states this seems to be worse after she's been sitting for a while and gets back up to stand or walk. She states that she's tried inserts and prednisone prescribed by her primary care provider to no avail. She states that and made it feel better for a while but it just recurred. 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 She has a 26-year-old.    Review of Systems  All other systems reviewed and are negative.      Objective:   Physical Exam: Vital signs are stable she is alert and oriented 3. Pulses are palpable. Neurologic symptoms from his intact. Deep tendon reflexes are intact. Muscle strength was 5 over 5 dorsiflexion plantar flexors and inverters everters onto the musculatures intact. She has severe pain on palpation medially located below the left heel. Radiographs reviewed today from the PCP demonstrates soft tissue increase in density at the palmar fascial calcaneal insertion site however these are not weightbearing views.        Assessment & Plan:  Assessment: Plantar fasciitis left.  Plan: Discussed etiology pathology conservative versus surgical therapies. Performed an injection to the left heel today posterior plantar fascia brace and a night splint started on a Medrol Dosepak to be followed by meloxicam. We discussed appropriate shoe gear stretching exercises ice therapy and shoe modifications. I recommended shoes other than what she was wearing.

## 2016-12-17 NOTE — Patient Instructions (Signed)

## 2016-12-18 ENCOUNTER — Encounter: Payer: Self-pay | Admitting: Family Medicine

## 2017-01-16 IMAGING — DX DG FOOT COMPLETE 3+V*L*
4 series · 4 of 4 positions shown · non-contrast
Comparison: None.

CLINICAL DATA: 25-year-old female with a history of pain left heel.
No injury.

EXAM:
LEFT FOOT - COMPLETE 3+ VIEW

[foot ap]
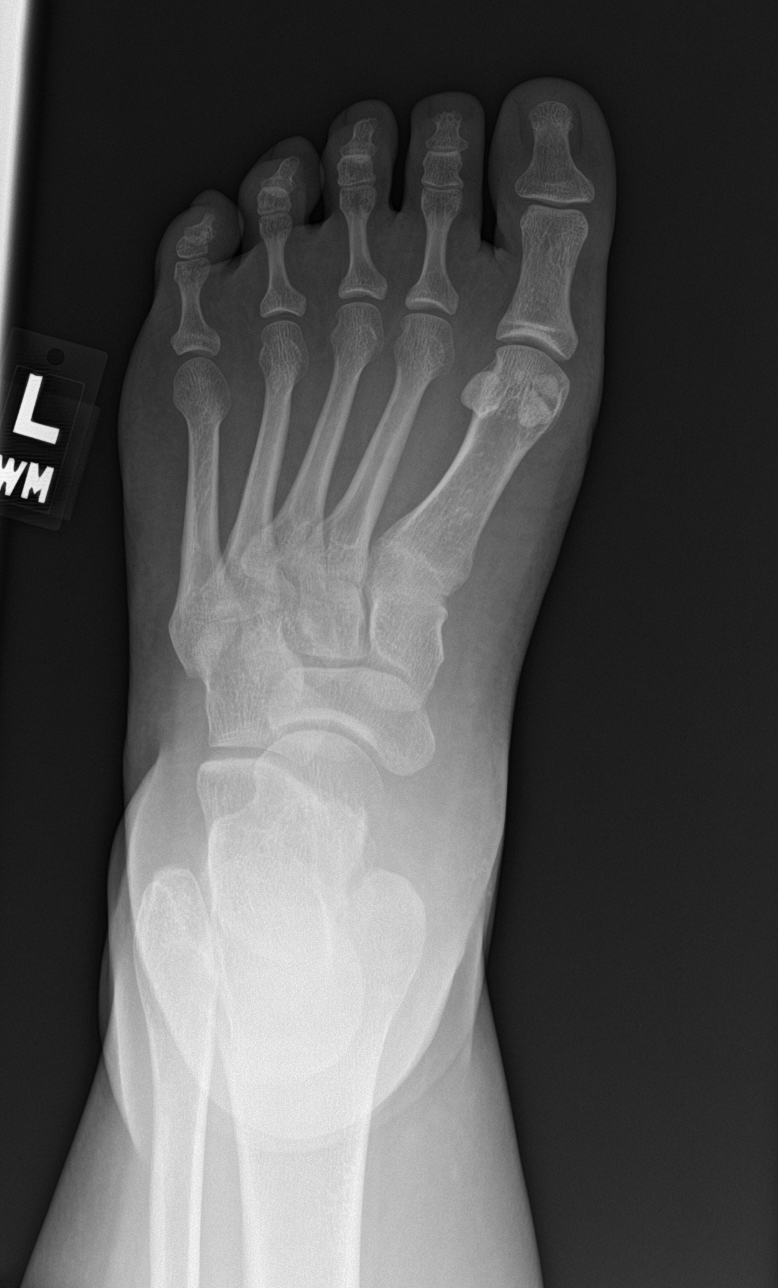

[foot obl]
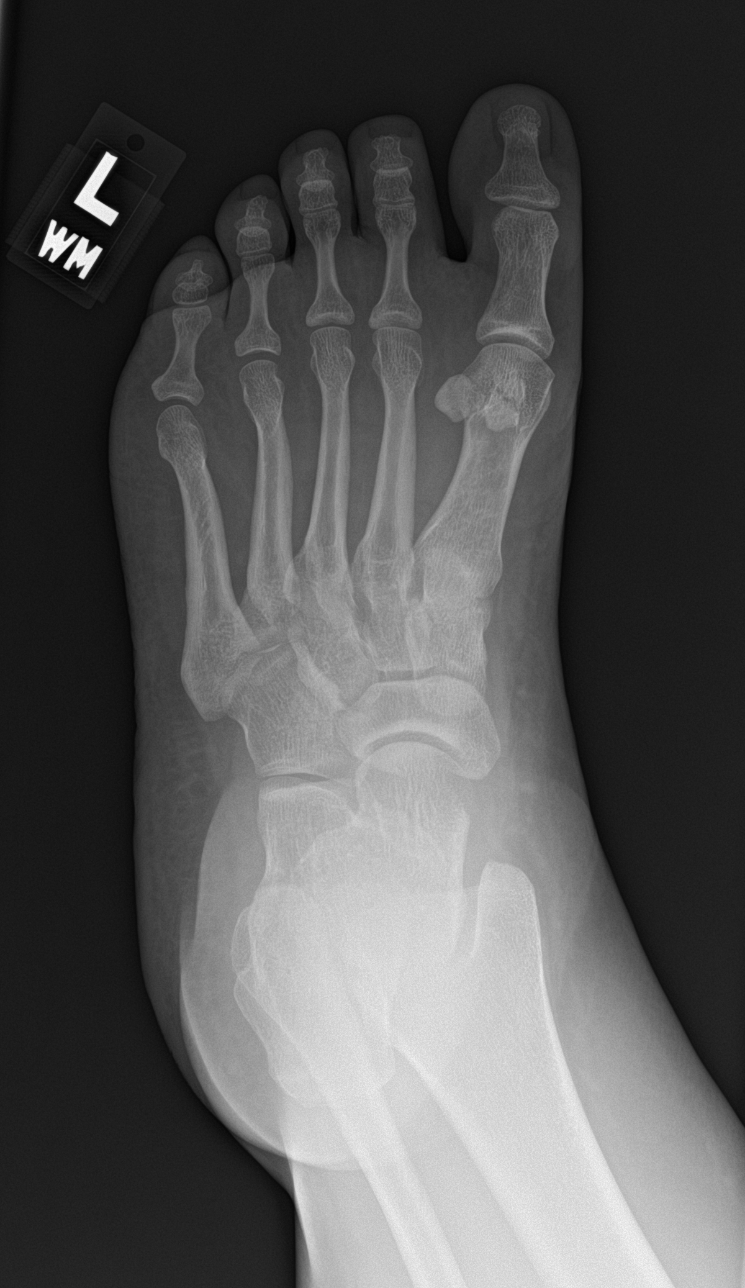

[foot lat (1 of 2)]
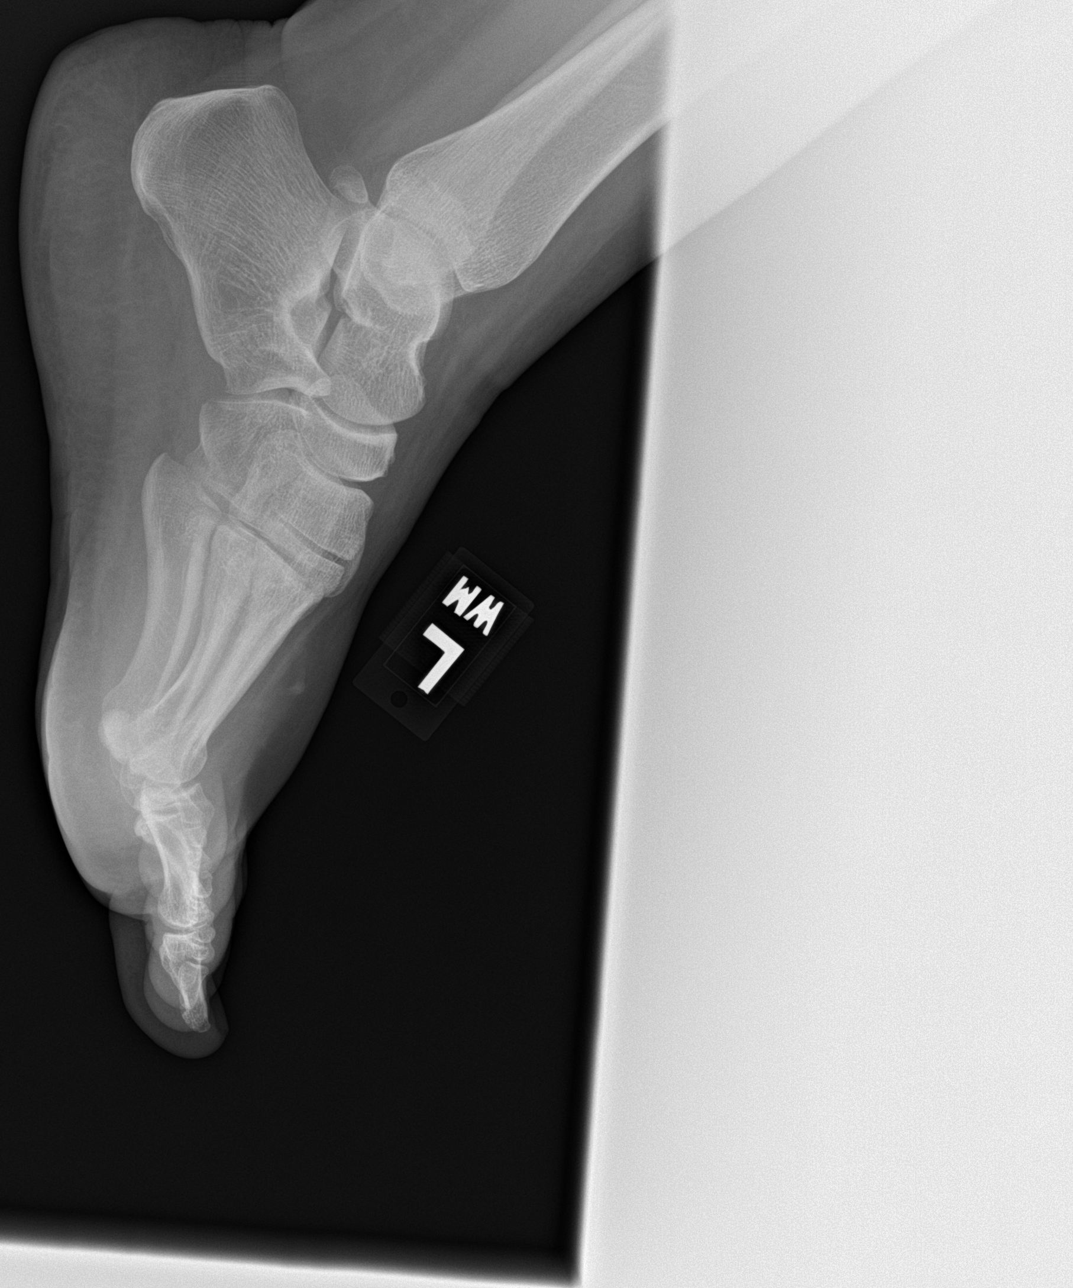

[foot lat (2 of 2)]
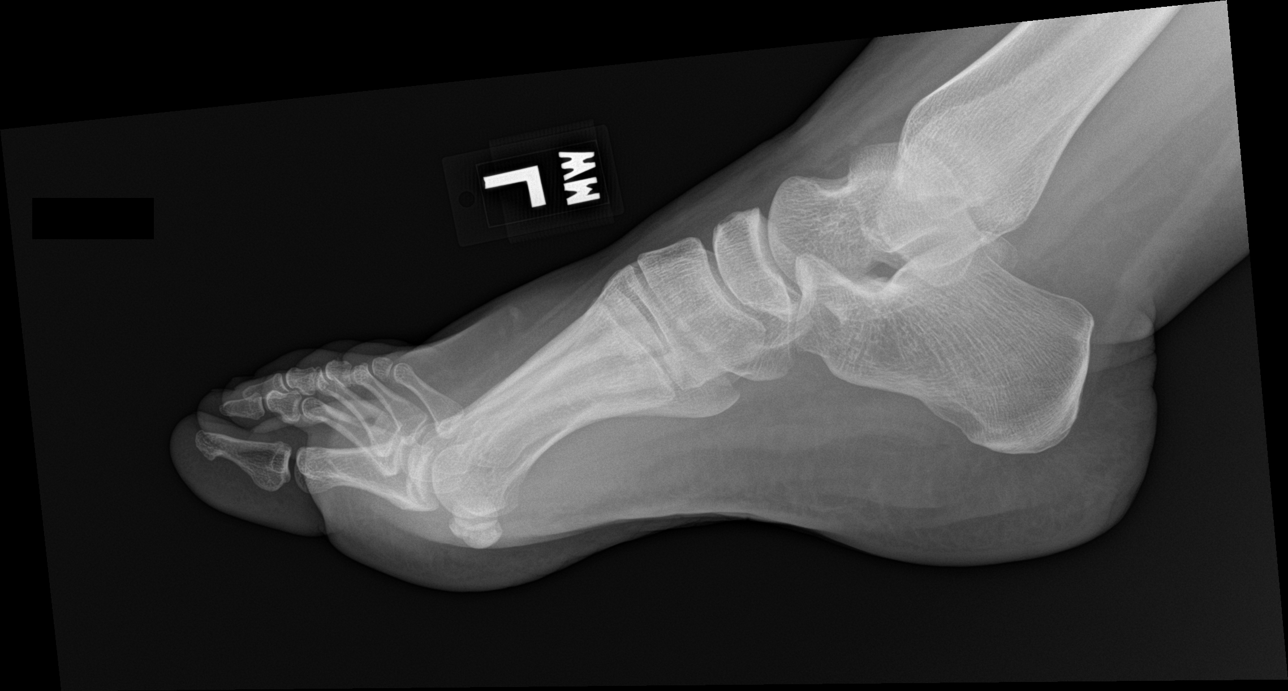

[4 of 4 positions shown; findings below may reference images not displayed]

FINDINGS: There is no evidence of fracture or dislocation. There is no
evidence of arthropathy or other focal bone abnormality. Soft
tissues are unremarkable.
IMPRESSION: No acute bony abnormality.

## 2017-07-17 ENCOUNTER — Encounter: Payer: Self-pay | Admitting: Emergency Medicine

## 2017-07-17 ENCOUNTER — Ambulatory Visit
Admission: EM | Admit: 2017-07-17 | Discharge: 2017-07-17 | Disposition: A | Discharge status: Home / Self Care | Payer: Medicaid Other | Attending: Family Medicine | Admitting: Family Medicine

## 2017-07-17 DIAGNOSIS — R05 Cough: Secondary | ICD-10-CM

## 2017-07-17 DIAGNOSIS — J069 Acute upper respiratory infection, unspecified: Secondary | ICD-10-CM

## 2017-07-17 MED ORDER — ALBUTEROL SULFATE HFA 108 (90 BASE) MCG/ACT IN AERS: 1.0000 | INHALATION_SPRAY | Freq: Four times a day (QID) | RESPIRATORY_TRACT | 0 refills | Status: DC | PRN

## 2017-07-17 MED ORDER — BENZONATATE 200 MG PO CAPS: ORAL_CAPSULE | ORAL | 0 refills | Status: DC

## 2017-07-17 MED ORDER — HYDROCOD POLST-CPM POLST ER 10-8 MG/5ML PO SUER: 5.0000 mL | Freq: Two times a day (BID) | ORAL | 0 refills | Status: DC

## 2017-07-17 NOTE — ED Provider Notes (Signed)
[facility]    CSN: [id number] Arrival date & time: 07/17/17  1526     History   Chief Complaint Chief Complaint  Patient presents with  . Cough  . Otalgia    right    HPI Alexa Diaz is a 26 y.o. female.   HPI  This is a 26 year old female who presents with a cough and chest congestion for 2 days duration. Today she also noticed some pain in her right ear. She's been around sick contacts; her daughter and her mother also have been sick; daughter with an ear infection mother with upper respiratory infection.Patient continues to smoke. She states she has had some chills but he is afebrile today. O2 sats are 100% on room air. Cough has been productive of a greenish colored mucus. She continues to smoke. Last night she had some tightness in her throat not have any difficulty breathing. Had no chest pain. Only a congestion feeling         Past Medical History:  Diagnosis Date  . Allergic rhinitis   . Back pain    3 bulging discs, arthritis and scoliosis 9/15  . Chronic post-traumatic stress disorder (PTSD)   . Herpes simplex virus infection    1 and 2  . Reduction defect of left lower extremity    Short L leg- wears lift    Patient Active Problem List   Diagnosis Date Noted  . Class 3 obesity due to excess calories without serious comorbidity with body mass index (BMI) of 40.0 to 44.9 in adult (HCC) 11/17/2016  . Urticaria 07/10/2015  . Leg length discrepancy 06/11/2015  . Low back pain 06/11/2015  . Herpes simplex virus infection   . Allergic rhinitis   . Chronic post-traumatic stress disorder (PTSD)     History reviewed. No pertinent surgical history.  OB History    No data available       Home Medications    Prior to Admission medications   Medication Sig Start Date End Date Taking? Authorizing Provider  albuterol (PROVENTIL HFA;VENTOLIN HFA) 108 (90 Base) MCG/ACT inhaler Inhale 1-2 puffs into the lungs every 6 (six) hours as needed for  wheezing or shortness of breath. Use with spacer 07/17/17   Lutricia Feil, PA-C  benzonatate (TESSALON) 200 MG capsule Take one cap TID PRN cough 07/17/17   Lutricia Feil, PA-C  chlorpheniramine-HYDROcodone [facility]) 10-8 MG/5ML SUER Take 5 mLs by mouth 2 (two) times daily. As necessary for cough 07/17/17   Ovid Curd P, PA-C  Etonogestrel [facility]) Inject into the skin.    [provider]    Family History Family History  Problem Relation Age of Onset  . Alcohol abuse Father   . Arthritis Father   . Hyperlipidemia Father   . Hypertension Father   . Thyroid disease Brother     Social History Social History  Substance Use Topics  . Smoking status: Former Smoker    Packs/day: 0.33    Types: Cigarettes    Quit date: 04/15/2016  . Smokeless tobacco: Never Used  . Alcohol use Yes     Comment: rare     Allergies   Sertraline hcl and Wellbutrin [bupropion]   Review of Systems Review of Systems  Constitutional: Positive for activity change and chills. Negative for appetite change, fatigue and fever.  HENT: Positive for congestion and postnasal drip.   Respiratory: Positive for cough and shortness of breath.   All other systems reviewed and are  negative.    Physical Exam Triage Vital Signs ED Triage Vitals  Enc Vitals Group     BP 07/17/17 1557 125/78     Pulse Rate 07/17/17 1557 80     Resp 07/17/17 1557 16     Temp 07/17/17 1557 98.3 F (36.8 C)     Temp Source 07/17/17 1557 Oral     SpO2 07/17/17 1557 100 %     Weight 07/17/17 1555 270 lb (122.5 kg)     Height 07/17/17 1555  (1.651 m)     Head Circumference --      Peak Flow --      Pain Score 07/17/17 1556 4     Pain Loc --      Pain Edu? --      Excl. in GC? --    No data found.   Updated Vital Signs BP 125/78 (BP Location: Left Arm)   Pulse 80   Temp 98.3 F (36.8 C) (Oral)   Resp 16   Ht  (1.651 m)   Wt 270 lb (122.5 kg)   SpO2 100%   BMI 44.93  kg/m   Visual Acuity Right Eye Distance:   Left Eye Distance:   Bilateral Distance:    Right Eye Near:   Left Eye Near:    Bilateral Near:     Physical Exam  Constitutional: She is oriented to person, place, and time. She appears well-developed and well-nourished. No distress.  HENT:  Head: Normocephalic.  Right Ear: External ear normal.  Left Ear: External ear normal.  Nose: Nose normal.  Mouth/Throat: Oropharynx is clear and moist. No oropharyngeal exudate.  Eyes: Pupils are equal, round, and reactive to light. Right eye exhibits no discharge. Left eye exhibits no discharge.  Neck: Normal range of motion. Neck supple.  Pulmonary/Chest: Effort normal and breath sounds normal. No respiratory distress. She has no wheezes. She has no rales.  Musculoskeletal: Normal range of motion.  Lymphadenopathy:    She has no cervical adenopathy.  Neurological: She is alert and oriented to person, place, and time.  Skin: Skin is warm and dry. She is not diaphoretic.  Psychiatric: She has a normal mood and affect. Her behavior is normal. Judgment and thought content normal.  Nursing note and vitals reviewed.    UC Treatments / Results  Labs (all labs ordered are listed, but only abnormal results are displayed) Labs Reviewed - No data to display  EKG  EKG Interpretation None       Radiology No results found.  Procedures Procedures (including critical care time)  Medications Ordered in UC Medications - No data to display   Initial Impression / Assessment and Plan / UC Course  I have reviewed the triage vital signs and the nursing notes.  Pertinent labs & imaging results that were available during my care of the patient were reviewed by me and considered in my medical decision making (see chart for details).     Plan: 1. Test/x-ray results and diagnosis reviewed with patient 2. rx as per orders; risks, benefits, potential side effects reviewed with patient 3. Recommend  supportive treatment with stopping smoking. Albuterol as necessary for shortness of breath. Use caution with Tussionex for activities requiring concentration and judgment and don't drive with taking medication. Recommend using Tessalon Per[place] in the daytime. If you are not Improving or worsening follow-up with your primary care physician. 4. F/u prn if symptoms worsen or don't improve   Final Clinical Impressions(s) /  UC Diagnoses   Final diagnoses:  Upper respiratory tract infection, unspecified type    New Prescriptions Discharge Medication List as of 07/17/2017  4:53 PM    START taking these medications   Details  albuterol (PROVENTIL HFA;VENTOLIN HFA) 108 (90 Base) MCG/ACT inhaler Inhale 1-2 puffs into the lungs every 6 (six) hours as needed for wheezing or shortness of breath. Use with spacer, Starting Fri 07/17/2017, Normal    benzonatate (TESSALON) 200 MG capsule Take one cap TID PRN cough, Normal    chlorpheniramine-HYDROcodone (TUSSIONEX PENNKINETIC ER) 10-8 MG/5ML SUER Take 5 mLs by mouth 2 (two) times daily. As necessary for cough, Starting Fri 07/17/2017, Print         Controlled Substance Prescriptions [place] Controlled Substance Registry consulted? Not Applicable   Lutricia Feil, PA-C 07/17/17 1719

## 2017-07-17 NOTE — ED Triage Notes (Signed)
Patient c/o cough and chest congestion for 2 days.  Patient also reports pain in her right ear that started today.

## 2018-01-04 ENCOUNTER — Encounter: Payer: Self-pay | Admitting: Emergency Medicine

## 2018-01-04 ENCOUNTER — Ambulatory Visit
Admission: EM | Admit: 2018-01-04 | Discharge: 2018-01-04 | Disposition: A | Discharge status: Home / Self Care | Payer: Medicaid Other | Attending: Family Medicine | Admitting: Family Medicine

## 2018-01-04 ENCOUNTER — Other Ambulatory Visit: Payer: Self-pay

## 2018-01-04 DIAGNOSIS — Z87891 Personal history of nicotine dependence: Secondary | ICD-10-CM | POA: Insufficient documentation

## 2018-01-04 DIAGNOSIS — J020 Streptococcal pharyngitis: Secondary | ICD-10-CM | POA: Insufficient documentation

## 2018-01-04 DIAGNOSIS — Z79899 Other long term (current) drug therapy: Secondary | ICD-10-CM | POA: Insufficient documentation

## 2018-01-04 LAB — RAPID STREP SCREEN (MED CTR MEBANE ONLY): STREPTOCOCCUS, GROUP A SCREEN (DIRECT): POSITIVE — AB

## 2018-01-04 MED ORDER — FLUCONAZOLE 150 MG PO TABS: 150.0000 mg | ORAL_TABLET | Freq: Every day | ORAL | 0 refills | Status: DC

## 2018-01-04 MED ORDER — PENICILLIN V POTASSIUM 500 MG PO TABS
500.0000 mg | ORAL_TABLET | Freq: Three times a day (TID) | ORAL | 0 refills | Status: DC
Start: 2018-01-04 — End: 2018-11-04

## 2018-01-04 MED ORDER — LIDOCAINE VISCOUS 2 % MT SOLN: OROMUCOSAL | 0 refills | Status: DC

## 2018-01-04 NOTE — ED Provider Notes (Signed)
[facility]    CSN: [id number] Arrival date & time: 01/04/18  1542     History   Chief Complaint Chief Complaint  Patient presents with  . Sore Throat    HPI Alexa Diaz is a 27 y.o. female.   The history is provided by the patient.  Sore Throat  This is a new problem. The current episode started yesterday. The problem occurs constantly. The problem has been gradually worsening. Pertinent negatives include no chest pain, no abdominal pain, no headaches and no shortness of breath. She has tried acetaminophen for the symptoms. The treatment provided mild relief.    Past Medical History:  Diagnosis Date  . Allergic rhinitis   . Back pain    3 bulging discs, arthritis and scoliosis 9/15  . Chronic post-traumatic stress disorder (PTSD)   . Herpes simplex virus infection    1 and 2  . Reduction defect of left lower extremity    Short L leg- wears lift    Patient Active Problem List   Diagnosis Date Noted  . Class 3 obesity due to excess calories without serious comorbidity with body mass index (BMI) of 40.0 to 44.9 in adult 11/17/2016  . Urticaria 07/10/2015  . Leg length discrepancy 06/11/2015  . Low back pain 06/11/2015  . Herpes simplex virus infection   . Allergic rhinitis   . Chronic post-traumatic stress disorder (PTSD)     History reviewed. No pertinent surgical history.  OB History    No data available       Home Medications    Prior to Admission medications   Medication Sig Start Date End Date Taking? Authorizing Provider  Etonogestrel (NEXPLANON [place]) Inject into the skin.   Yes [provider]  albuterol (PROVENTIL HFA;VENTOLIN HFA) 108 (90 Base) MCG/ACT inhaler Inhale 1-2 puffs into the lungs every 6 (six) hours as needed for wheezing or shortness of breath. Use with spacer 07/17/17   Lutricia Feil, PA-C  benzonatate (TESSALON) 200 MG capsule Take one cap TID PRN cough 07/17/17   Lutricia Feil, PA-C    chlorpheniramine-HYDROcodone [facility]) 10-8 MG/5ML SUER Take 5 mLs by mouth 2 (two) times daily. As necessary for cough 07/17/17   Ovid Curd P, PA-C  fluconazole (DIFLUCAN) 150 MG tablet Take 1 tablet (150 mg total) by mouth daily. 01/04/18   Payton Mccallum, MD  lidocaine (XYLOCAINE) 2 % solution 20 ml gargle and spit q 6 hours prn sore throat 01/04/18   Payton Mccallum, MD  penicillin v potassium (VEETID) 500 MG tablet Take 1 tablet (500 mg total) by mouth 3 (three) times daily. 01/04/18   Payton Mccallum, MD    Family History Family History  Problem Relation Age of Onset  . Alcohol abuse Father   . Arthritis Father   . Hyperlipidemia Father   . Hypertension Father   . Thyroid disease Brother     Social History Social History   Tobacco Use  . Smoking status: Former Smoker    Packs/day: 0.33    Types: Cigarettes    Last attempt to quit: 04/15/2016    Years since quitting: 1.7  . Smokeless tobacco: Never Used  Substance Use Topics  . Alcohol use: Yes    Comment: rare  . Drug use: Yes    Types: Marijuana     Allergies   Sertraline hcl and Wellbutrin [bupropion]   Review of Systems Review of Systems  Respiratory: Negative for shortness of breath.   Cardiovascular: Negative  for chest pain.  Gastrointestinal: Negative for abdominal pain.  Neurological: Negative for headaches.     Physical Exam Triage Vital Signs ED Triage Vitals  Enc Vitals Group     BP 01/04/18 1603 127/80     Pulse Rate 01/04/18 1603 91     Resp 01/04/18 1603 16     Temp 01/04/18 1603 98.8 F (37.1 C)     Temp Source 01/04/18 1603 Oral     SpO2 01/04/18 1603 100 %     Weight 01/04/18 1600 270 lb (122.5 kg)     Height 01/04/18 1600 5\' 4"  (1.626 m)     Head Circumference --      Peak Flow --      Pain Score 01/04/18 1559 10     Pain Loc --      Pain Edu? --      Excl. in GC? --    No data found.  Updated Vital Signs BP 127/80 (BP Location: Left Arm)   Pulse 91    Temp 98.8 F (37.1 C) (Oral)   Resp 16   Ht 5\' 4"  (1.626 m)   Wt 270 lb (122.5 kg)   SpO2 100%   BMI 46.35 kg/m   Visual Acuity Right Eye Distance:   Left Eye Distance:   Bilateral Distance:    Right Eye Near:   Left Eye Near:    Bilateral Near:     Physical Exam  Constitutional: She appears well-developed and well-nourished. No distress.  HENT:  Head: Normocephalic and atraumatic.  Right Ear: Tympanic membrane, external ear and ear canal normal.  Left Ear: Tympanic membrane, external ear and ear canal normal.  Nose: No mucosal edema, rhinorrhea, nose lacerations, sinus tenderness, nasal deformity, septal deviation or nasal septal hematoma. No epistaxis.  No foreign bodies. Right sinus exhibits no maxillary sinus tenderness and no frontal sinus tenderness. Left sinus exhibits no maxillary sinus tenderness and no frontal sinus tenderness.  Mouth/Throat: Uvula is midline and mucous membranes are normal. Oropharyngeal exudate and posterior oropharyngeal erythema present. No posterior oropharyngeal edema or tonsillar abscesses. Tonsillar exudate.  Eyes: Conjunctivae are normal. Right eye exhibits no discharge. Left eye exhibits no discharge. No scleral icterus.  Neck: Normal range of motion. Neck supple. No thyromegaly present.  Cardiovascular: Normal rate.  Pulmonary/Chest: Effort normal. No stridor. No respiratory distress.  Lymphadenopathy:    She has no cervical adenopathy.  Skin: She is not diaphoretic.  Nursing note and vitals reviewed.    UC Treatments / Results  Labs (all labs ordered are listed, but only abnormal results are displayed) Labs Reviewed  RAPID STREP SCREEN (NOT AT [facility]) - Abnormal; Notable for the following components:      Result Value   Streptococcus, Group A Screen (Direct) POSITIVE (*)    All other components within normal limits    EKG  EKG Interpretation None       Radiology No results found.  Procedures Procedures (including critical  care time)  Medications Ordered in UC Medications - No data to display   Initial Impression / Assessment and Plan / UC Course  I have reviewed the triage vital signs and the nursing notes.  Pertinent labs & imaging results that were available during my care of the patient were reviewed by me and considered in my medical decision making (see chart for details).       Final Clinical Impressions(s) / UC Diagnoses   Final diagnoses:  Strep throat    ED Discharge  Orders        Ordered    penicillin v potassium (VEETID) 500 MG tablet  3 times daily     01/04/18 1644    lidocaine (XYLOCAINE) 2 % solution     01/04/18 1644    fluconazole (DIFLUCAN) 150 MG tablet  Daily     01/04/18 1644     1. Lab results and diagnosis reviewed with patient 2. rx as per orders above; reviewed possible side effects, interactions, risks and benefits  3. Recommend supportive treatment with rest, fluids, otc analgesics prn  4. Follow-up prn if symptoms worsen or don't improve  Controlled Substance Prescriptions [place] Controlled Substance Registry consulted? Not Applicable   Payton Mccallumonty, Aimie Wagman, MD 01/04/18 1650

## 2018-01-04 NOTE — ED Triage Notes (Signed)
Patient c/o fever since Saturday.  Patient states that she had sore throat since yesterday.

## 2018-01-07 ENCOUNTER — Ambulatory Visit (INDEPENDENT_AMBULATORY_CARE_PROVIDER_SITE_OTHER): Payer: Medicaid Other | Admitting: Certified Nurse Midwife

## 2018-01-07 VITALS — BP 118/85 | HR 54 | Ht 64.0 in | Wt 296.6 lb

## 2018-01-07 DIAGNOSIS — Z30017 Encounter for initial prescription of implantable subdermal contraceptive: Secondary | ICD-10-CM

## 2018-01-07 DIAGNOSIS — Z3046 Encounter for surveillance of implantable subdermal contraceptive: Secondary | ICD-10-CM | POA: Diagnosis not present

## 2018-01-07 NOTE — Patient Instructions (Signed)
Nexplanon Instructions After Insertion   Keep bandage clean and dry for 24 hours   May use ice/Tylenol/Ibuprofen for soreness or pain   If you develop fever, drainage or increased warmth from incision site-contact office immediately  Etonogestrel implant What is this medicine? ETONOGESTREL (et oh noe JES trel) is a contraceptive (birth control) device. It is used to prevent pregnancy. It can be used for up to 3 years. This medicine may be used for other purposes; ask your health care provider or pharmacist if you have questions. COMMON BRAND NAME(S): Implanon, Nexplanon What should I tell my health care provider before I take this medicine? They need to know if you have any of these conditions: -abnormal vaginal bleeding -blood vessel disease or blood clots -cancer of the breast, cervix, or liver -depression -diabetes -gallbladder disease -headaches -heart disease or recent heart attack -high blood pressure -high cholesterol -kidney disease -liver disease -renal disease -seizures -tobacco smoker -an unusual or allergic reaction to etonogestrel, other hormones, anesthetics or antiseptics, medicines, foods, dyes, or preservatives -pregnant or trying to get pregnant -breast-feeding How should I use this medicine? This device is inserted just under the skin on the inner side of your upper arm by a health care professional. Talk to your pediatrician regarding the use of this medicine in children. Special care may be needed. Overdosage: If you think you have taken too much of this medicine contact a poison control center or emergency room at once. NOTE: This medicine is only for you. Do not share this medicine with others. What if I miss a dose? This does not apply. What may interact with this medicine? Do not take this medicine with any of the following medications: -amprenavir -bosentan -fosamprenavir This medicine may also interact with the following  medications: -barbiturate medicines for inducing sleep or treating seizures -certain medicines for fungal infections like ketoconazole and itraconazole -grapefruit juice -griseofulvin -medicines to treat seizures like carbamazepine, felbamate, oxcarbazepine, phenytoin, topiramate -modafinil -phenylbutazone -rifampin -rufinamide -some medicines to treat HIV infection like atazanavir, indinavir, lopinavir, nelfinavir, tipranavir, ritonavir -St. John's wort This list may not describe all possible interactions. Give your health care provider a list of all the medicines, herbs, non-prescription drugs, or dietary supplements you use. Also tell them if you smoke, drink alcohol, or use illegal drugs. Some items may interact with your medicine. What should I watch for while using this medicine? This product does not protect you against HIV infection (AIDS) or other sexually transmitted diseases. You should be able to feel the implant by pressing your fingertips over the skin where it was inserted. Contact your doctor if you cannot feel the implant, and use a non-hormonal birth control method (such as condoms) until your doctor confirms that the implant is in place. If you feel that the implant may have broken or become bent while in your arm, contact your healthcare provider. What side effects may I notice from receiving this medicine? Side effects that you should report to your doctor or health care professional as soon as possible: -allergic reactions like skin rash, itching or hives, swelling of the face, lips, or tongue -breast lumps -changes in emotions or moods -depressed mood -heavy or prolonged menstrual bleeding -pain, irritation, swelling, or bruising at the insertion site -scar at site of insertion -signs of infection at the insertion site such as fever, and skin redness, pain or discharge -signs of pregnancy -signs and symptoms of a blood clot such as breathing problems; changes in  vision; chest pain; severe,  sudden headache; pain, swelling, warmth in the leg; trouble speaking; sudden numbness or weakness of the face, arm or leg -signs and symptoms of liver injury like dark yellow or brown urine; general ill feeling or flu-like symptoms; light-colored stools; loss of appetite; nausea; right upper belly pain; unusually weak or tired; yellowing of the eyes or skin -unusual vaginal bleeding, discharge -signs and symptoms of a stroke like changes in vision; confusion; trouble speaking or understanding; severe headaches; sudden numbness or weakness of the face, arm or leg; trouble walking; dizziness; loss of balance or coordination Side effects that usually do not require medical attention (report to your doctor or health care professional if they continue or are bothersome): -acne -back pain -breast pain -changes in weight -dizziness -general ill feeling or flu-like symptoms -headache -irregular menstrual bleeding -nausea -sore throat -vaginal irritation or inflammation This list may not describe all possible side effects. Call your doctor for medical advice about side effects. You may report side effects to FDA at 1-800-FDA-1088. Where should I keep my medicine? This drug is given in a hospital or clinic and will not be stored at home. NOTE: This sheet is a summary. It may not cover all possible information. If you have questions about this medicine, talk to your doctor, pharmacist, or health care provider.  2018 Elsevier/Gold Standard (2016-05-01 11:19:22)  Preventive Care 18-39 Years, Female Preventive care refers to lifestyle choices and visits with your health care provider that can promote health and wellness. What does preventive care include?  A yearly physical exam. This is also called an annual well check.  Dental exams once or twice a year.  Routine eye exams. Ask your health care provider how often you should have your eyes checked.  Personal lifestyle  choices, including: ? Daily care of your teeth and gums. ? Regular physical activity. ? Eating a healthy diet. ? Avoiding tobacco and drug use. ? Limiting alcohol use. ? Practicing safe sex. ? Taking vitamin and mineral supplements as recommended by your health care provider. What happens during an annual well check? The services and screenings done by your health care provider during your annual well check will depend on your age, overall health, lifestyle risk factors, and family history of disease. Counseling Your health care provider may ask you questions about your:  Alcohol use.  Tobacco use.  Drug use.  Emotional well-being.  Home and relationship well-being.  Sexual activity.  Eating habits.  Work and work Statistician.  Method of birth control.  Menstrual cycle.  Pregnancy history.  Screening You may have the following tests or measurements:  Height, weight, and BMI.  Diabetes screening. This is done by checking your blood sugar (glucose) after you have not eaten for a while (fasting).  Blood pressure.  Lipid and cholesterol levels. These may be checked every 5 years starting at age 61.  Skin check.  Hepatitis C blood test.  Hepatitis B blood test.  Sexually transmitted disease (STD) testing.  BRCA-related cancer screening. This may be done if you have a family history of breast, ovarian, tubal, or peritoneal cancers.  Pelvic exam and Pap test. This may be done every 3 years starting at age 28. Starting at age 16, this may be done every 5 years if you have a Pap test in combination with an HPV test.  Discuss your test results, treatment options, and if necessary, the need for more tests with your health care provider. Vaccines Your health care provider may recommend certain vaccines, such  as:  Influenza vaccine. This is recommended every year.  Tetanus, diphtheria, and acellular pertussis (Tdap, Td) vaccine. You may need a Td booster every 10  years.  Varicella vaccine. You may need this if you have not been vaccinated.  HPV vaccine. If you are 46 or younger, you may need three doses over 6 months.  Measles, mumps, and rubella (MMR) vaccine. You may need at least one dose of MMR. You may also need a second dose.  Pneumococcal 13-valent conjugate (PCV13) vaccine. You may need this if you have certain conditions and were not previously vaccinated.  Pneumococcal polysaccharide (PPSV23) vaccine. You may need one or two doses if you smoke cigarettes or if you have certain conditions.  Meningococcal vaccine. One dose is recommended if you are age 58-21 years and a first-year college student living in a residence hall, or if you have one of several medical conditions. You may also need additional booster doses.  Hepatitis A vaccine. You may need this if you have certain conditions or if you travel or work in places where you may be exposed to hepatitis A.  Hepatitis B vaccine. You may need this if you have certain conditions or if you travel or work in places where you may be exposed to hepatitis B.  Haemophilus influenzae type b (Hib) vaccine. You may need this if you have certain risk factors.  Talk to your health care provider about which screenings and vaccines you need and how often you need them. This information is not intended to replace advice given to you by your health care provider. Make sure you discuss any questions you have with your health care provider. Document Released: 12/09/2001 Document Revised: 07/02/2016 Document Reviewed: 08/14/2015 Elsevier Interactive Patient Education  [employer].

## 2018-01-07 NOTE — Progress Notes (Signed)
Pt is here for a Nexplanon removal and reinsertion. Informed consent signed.

## 2018-01-11 ENCOUNTER — Telehealth: Payer: Self-pay | Admitting: Certified Nurse Midwife

## 2018-01-11 NOTE — Telephone Encounter (Signed)
Patient called stating she has a pulling sensation in her arm at the nexplanon site. She would like to know if this is normal. Please Advise.

## 2018-01-13 ENCOUNTER — Encounter: Payer: Self-pay | Admitting: Certified Nurse Midwife

## 2018-01-13 ENCOUNTER — Telehealth: Payer: Self-pay

## 2018-01-13 NOTE — Telephone Encounter (Signed)
Area could be sensitive, because Nexplanon was removed and replaced in same spot. If symptoms continue, then may come in for evaluation. Thanks, JML

## 2018-01-13 NOTE — Telephone Encounter (Signed)
Pt informed

## 2018-01-13 NOTE — Progress Notes (Signed)
Alexa HolmsJennifer Diaz is a 27 y.o. year old G1P1  Caucasian female here for Nexplanon removal and reinsertion.  She was given informed consent for removal and reinsertion of her Nexplanon. Her Nexplanon was placed 01/2015, Patient's last menstrual period was 01/05/2018 (exact date).  Risks/benefits/side effects of Nexplanon have been discussed and her questions have been answered.  Specifically, a failure rate of 10/998 has been reported, with an increased failure rate if pt takes St. John's Wort and/or antiseizure medicaitons.  Alexa Diaz is aware of the common side effect of irregular bleeding, which the incidence of decreases over time.  BP 118/85   Pulse (!) 54   Ht 5\' 4"  (1.626 m)   Wt 296 lb 9 oz (134.5 kg)   LMP 01/05/2018 (Exact Date)   BMI 50.90 kg/m   Appropriate time out taken. Nexplanon site identified.  Area prepped in usual sterile fashon. Two cc's of 2% lidocaine was used to anesthetize the area. A small stab incision was made right beside the implant on the distal portion.  The Nexplanon rod was grasped using hemostats and removed intact without difficulty.  The area was cleansed again with betadine and the Nexplanon was inserted per manufacturer's recommendations without difficulty.  Steri-strips and a pressure bandage was applied.  There was less than 3 cc blood loss. There were no complications.  The patient tolerated the procedure well.  She was instructed to keep the area clean and dry, remove pressure bandage in 24 hours, and keep insertion site covered with the steri-strips for 3-5 days.  She was given a card indicating date Nexplanon was inserted and date it needs to be removed.   Follow-up PRN problems.   Serafina RoyalsMichelle Alysha Doolan, CNM  [facility], [facility]

## 2018-11-04 ENCOUNTER — Ambulatory Visit
Admission: EM | Admit: 2018-11-04 | Discharge: 2018-11-04 | Disposition: A | Discharge status: Home / Self Care | Payer: Medicaid Other | Attending: Emergency Medicine | Admitting: Emergency Medicine

## 2018-11-04 ENCOUNTER — Other Ambulatory Visit: Payer: Self-pay

## 2018-11-04 DIAGNOSIS — J029 Acute pharyngitis, unspecified: Secondary | ICD-10-CM

## 2018-11-04 DIAGNOSIS — J09X2 Influenza due to identified novel influenza A virus with other respiratory manifestations: Secondary | ICD-10-CM

## 2018-11-04 DIAGNOSIS — J101 Influenza due to other identified influenza virus with other respiratory manifestations: Secondary | ICD-10-CM

## 2018-11-04 LAB — RAPID STREP SCREEN (MED CTR MEBANE ONLY): STREPTOCOCCUS, GROUP A SCREEN (DIRECT): NEGATIVE

## 2018-11-04 LAB — RAPID INFLUENZA A&B ANTIGENS (ARMC ONLY)
INFLUENZA A (ARMC): POSITIVE — AB
INFLUENZA B (ARMC): NEGATIVE

## 2018-11-04 MED ORDER — OSELTAMIVIR PHOSPHATE 75 MG PO CAPS: 75.0000 mg | ORAL_CAPSULE | Freq: Two times a day (BID) | ORAL | 0 refills | Status: DC

## 2018-11-04 NOTE — Discharge Instructions (Addendum)
Take medication as prescribed. Rest. Drink plenty of fluids. Over the counter tylenol and ibuprofen.  ° °Follow up with your primary care physician this week as needed. Return to Urgent care for new or worsening concerns.  ° °

## 2018-11-04 NOTE — ED Provider Notes (Signed)
[facility] ____________________________________________  Time seen: Approximately 4:10 PM  I have reviewed the triage vital signs and the nursing notes.   HISTORY  Chief Complaint Fever (APPT)   HPI Alexa HolmsJennifer Diaz is a 28 y.o. female presenting for evaluation of nasal congestion, sore throat, cough, chills and body aches.  States this happened abruptly later Tuesday night.  States yesterday she felt she began running fever.  Has been taken over-the-counter Tylenol and ibuprofen which helps some.  Has been alternating them today as well.  States feels sore and achy from her head to her toes.  Denies known direct sick contacts.  Has continued to eat and drink well.  Denies chest pain, abdominal pain or dysuria.  States feels so tired she feels like she has some shortness of breath, but states minimal and able to continue to take deep breaths well.  Denies other aggravating or alleviating factors.  Reports otherwise feels well.  Denies recent sickness.  Johnson, Megan P, DO: PCP No LMP recorded. Patient has had an implant.   Past Medical History:  Diagnosis Date  . Allergic rhinitis   . Back pain    3 bulging discs, arthritis and scoliosis 9/15  . Chronic post-traumatic stress disorder (PTSD)   . Herpes simplex virus infection    1 and 2  . Reduction defect of left lower extremity    Short L leg- wears lift    Patient Active Problem List   Diagnosis Date Noted  . Class 3 obesity due to excess calories without serious comorbidity with body mass index (BMI) of 40.0 to 44.9 in adult 11/17/2016  . Urticaria 07/10/2015  . Leg length discrepancy 06/11/2015  . Low back pain 06/11/2015  . Herpes simplex virus infection   . Allergic rhinitis   . Chronic post-traumatic stress disorder (PTSD)     Past Surgical History:  Procedure Laterality Date  . NO PAST SURGERIES       No current facility-administered medications for this encounter.   Current Outpatient  Medications:  .  albuterol (PROVENTIL HFA;VENTOLIN HFA) 108 (90 Base) MCG/ACT inhaler, Inhale 1-2 puffs into the lungs every 6 (six) hours as needed for wheezing or shortness of breath. Use with spacer, Disp: 1 Inhaler, Rfl: 0 .  Etonogestrel (NEXPLANON [place]), Inject into the skin., Disp: , Rfl:  .  oseltamivir (TAMIFLU) 75 MG capsule, Take 1 capsule (75 mg total) by mouth every 12 (twelve) hours., Disp: 10 capsule, Rfl: 0  Allergies Sertraline hcl and Wellbutrin [bupropion]  Family History  Problem Relation Age of Onset  . Alcohol abuse Father   . Arthritis Father   . Hyperlipidemia Father   . Hypertension Father   . Thyroid disease Brother     Social History Social History   Tobacco Use  . Smoking status: Former Smoker    Packs/day: 0.33    Types: Cigarettes    Last attempt to quit: 04/15/2016    Years since quitting: 2.5  . Smokeless tobacco: Never Used  Substance Use Topics  . Alcohol use: Yes    Comment: rare  . Drug use: Not Currently    Types: Marijuana    Review of Systems Constitutional: Positive subjective fevers. ENT: As above. Cardiovascular: Denies chest pain. Respiratory: As above. Gastrointestinal: No abdominal pain.   Musculoskeletal: Negative for back pain. Skin: Negative for rash.   ____________________________________________   PHYSICAL EXAM:  VITAL SIGNS: ED Triage Vitals  Enc Vitals Group     BP 11/04/18 1454 127/76  Pulse Rate 11/04/18 1454 (!) 111     Resp 11/04/18 1454 18     Temp 11/04/18 1454 99.4 F (37.4 C)     Temp Source 11/04/18 1454 Oral     SpO2 11/04/18 1454 100 %     Weight 11/04/18 1451 290 lb (131.5 kg)     Height 11/04/18 1451 5\' 5"  (1.651 m)     Head Circumference --      Peak Flow --      Pain Score 11/04/18 1451 8     Pain Loc --      Pain Edu? --      Excl. in GC? --     Constitutional: Alert and oriented. Well appearing and in no acute distress. Eyes: Conjunctivae are normal. P Head: Atraumatic. No sinus  tenderness to palpation. No swelling. No erythema.  Ears: no erythema, normal TMs bilaterally.   Nose:Nasal congestion   Mouth/Throat: Mucous membranes are moist. Mild pharyngeal erythema. No tonsillar swelling or exudate.  Neck: No stridor.  No cervical spine tenderness to palpation. Hematological/Lymphatic/Immunilogical: No cervical lymphadenopathy. Cardiovascular: Normal rate, regular rhythm. Grossly normal heart sounds.  Good peripheral circulation. Respiratory: Normal respiratory effort.  No retractions. No wheezes, rales or rhonchi. Good air movement.  Musculoskeletal: Ambulatory with steady gait.  Neurologic:  Normal speech and language. No gait instability. Skin:  Skin appears warm, dry and intact. No rash noted. Psychiatric: Mood and affect are normal. Speech and behavior are normal.  ___________________________________________   LABS (all labs ordered are listed, but only abnormal results are displayed)  Labs Reviewed  RAPID INFLUENZA A&B ANTIGENS ([facility] ONLY) - Abnormal; Notable for the following components:      Result Value   Influenza A ([facility]) POSITIVE (*)    All other components within normal limits  RAPID STREP SCREEN ([facility] ONLY)  CULTURE, GROUP A STREP [facility])    PROCEDURES Procedures    INITIAL IMPRESSION / ASSESSMENT AND PLAN / ED COURSE  Pertinent labs & imaging results that were available during my care of the patient were reviewed by me and considered in my medical decision making (see chart for details).  Well-appearing patient.  No acute distress.  Strep negative, will culture.  Influenza A positive.  Patient clinical appearance consistent with influenza.  Discussed treatment options, will Rx Tamiflu.  Encourage rest, fluids, supportive care, over-the-counter Tylenol ibuprofen and congestion medication as needed.  Patient works around food, work note given.Discussed indication, risks and benefits of medications with patient.  Discussed follow up  with Primary care physician this week. Discussed follow up and return parameters including no resolution or any worsening concerns. Patient verbalized understanding and agreed to plan.   ____________________________________________   FINAL CLINICAL IMPRESSION(S) / ED DIAGNOSES  Final diagnoses:  Influenza A     ED Discharge Orders         Ordered    oseltamivir (TAMIFLU) 75 MG capsule  Every 12 hours     11/04/18 1532           Note: This dictation was prepared with Dragon dictation along with smaller phrase technology. Any transcriptional errors that result from this process are unintentional.         Renford Dills, NP 11/04/18 [phone]

## 2018-11-04 NOTE — ED Triage Notes (Signed)
Patient complains of fever, body aches, cough, sore throat that started on Tuesday PM. Patient states that she has never felt this bad.

## 2018-11-07 LAB — CULTURE, GROUP A STREP (THRC)

## 2019-11-21 ENCOUNTER — Ambulatory Visit
Admission: EM | Admit: 2019-11-21 | Discharge: 2019-11-21 | Disposition: A | Discharge status: Home / Self Care | Payer: Self-pay | Attending: Family Medicine | Admitting: Family Medicine

## 2019-11-21 ENCOUNTER — Other Ambulatory Visit: Payer: Self-pay

## 2019-11-21 DIAGNOSIS — Z202 Contact with and (suspected) exposure to infections with a predominantly sexual mode of transmission: Secondary | ICD-10-CM | POA: Insufficient documentation

## 2019-11-21 MED ORDER — AZITHROMYCIN 500 MG PO TABS
1000.0000 mg | ORAL_TABLET | Freq: Every day | ORAL | Status: DC
  Administered 2019-11-21: 09:00:00 1000 mg via ORAL

## 2019-11-21 NOTE — ED Provider Notes (Signed)
[facility] ____________________________________________  Time seen: Approximately 9:40 AM  I have reviewed the triage vital signs and the nursing notes.   HISTORY  Chief Complaint Exposure to STD   HPI Alexa Diaz is a 29 y.o. female presenting for evaluation of chlamydia.  Patient reports this past week she received word from her boyfriend that he tested positive for chlamydia.  Patient denies any complaints.  Patient denies vaginal discharge, vaginal odor, dysuria, abdominal pain, back pain.  Did start her menstrual cycle this morning was having some menstrual cramps which she describes as consistent with her normal menses.  Denies pregnancy.  Discussed further evaluation of other STDs at this time, patient declined any other STD testing.  Reports otherwise doing well.  Denies fevers or recent sickness.  No LMP recorded. Patient has had an implant.    Past Medical History:  Diagnosis Date  . Allergic rhinitis   . Back pain    3 bulging discs, arthritis and scoliosis 9/15  . Chronic post-traumatic stress disorder (PTSD)   . Herpes simplex virus infection    1 and 2  . Reduction defect of left lower extremity    Short L leg- wears lift    Patient Active Problem List   Diagnosis Date Noted  . Class 3 obesity due to excess calories without serious comorbidity with body mass index (BMI) of 40.0 to 44.9 in adult 11/17/2016  . Urticaria 07/10/2015  . Leg length discrepancy 06/11/2015  . Low back pain 06/11/2015  . Herpes simplex virus infection   . Allergic rhinitis   . Chronic post-traumatic stress disorder (PTSD)     Past Surgical History:  Procedure Laterality Date  . NO PAST SURGERIES        Current Facility-Administered Medications:  .  azithromycin (ZITHROMAX) tablet 1,000 mg, 1,000 mg, Oral, Daily, Marylene Land, NP, 1,000 mg at 11/21/19 0920  Current Outpatient Medications:  .  Etonogestrel (NEXPLANON [place]), Inject into the skin., Disp: ,  Rfl:   Allergies Sertraline hcl and Wellbutrin [bupropion]  Family History  Problem Relation Age of Onset  . Alcohol abuse Father   . Arthritis Father   . Hyperlipidemia Father   . Hypertension Father   . Thyroid disease Brother     Social History Social History   Tobacco Use  . Smoking status: Former Smoker    Packs/day: 0.33    Types: Cigarettes    Quit date: 04/15/2016    Years since quitting: 3.6  . Smokeless tobacco: Never Used  Substance Use Topics  . Alcohol use: Yes    Comment: rare  . Drug use: Not Currently    Types: Marijuana    Review of Systems Constitutional: No fever ENT: No sore throat. Cardiovascular: Denies chest pain. Respiratory: Denies shortness of breath. Gastrointestinal: No abdominal pain.  No nausea, no vomiting.  No diarrhea.   Genitourinary: Negative for dysuria. As above.  Skin: Negative for rash.   ____________________________________________   PHYSICAL EXAM:  VITAL SIGNS: ED Triage Vitals  Enc Vitals Group     BP 11/21/19 0858 117/82     Pulse Rate 11/21/19 0858 (!) 55     Resp 11/21/19 0858 16     Temp 11/21/19 0858 99.2 F (37.3 C)     Temp Source 11/21/19 0858 Oral     SpO2 11/21/19 0858 100 %     Weight 11/21/19 0853 285 lb (129.3 kg)     Height 11/21/19 0853 5\' 5"  (1.651 m)  Head Circumference --      Peak Flow --      Pain Score 11/21/19 0852 0     Pain Loc --      Pain Edu? --      Excl. in GC? --     Constitutional: Alert and oriented. Well appearing and in no acute distress. Eyes: Conjunctivae are normal.  ENT      Head: Normocephalic and atraumatic. Cardiovascular: Normal rate, regular rhythm. Grossly normal heart sounds.  Good peripheral circulation. Respiratory: Normal respiratory effort without tachypnea nor retractions. Breath sounds are clear and equal bilaterally. No wheezes, rales, rhonchi. Gastrointestinal: Soft and nontender. No CVA tenderness. Musculoskeletal: Steady gait.  Neurologic:   Normal speech and language. Speech is normal. No gait instability.  Skin:  Skin is warm, dry and intact. No rash noted. Psychiatric: Mood and affect are normal. Speech and behavior are normal. Patient exhibits appropriate insight and judgment   ___________________________________________   LABS (all labs ordered are listed, but only abnormal results are displayed)  Labs Reviewed  GC/CHLAMYDIA PROBE AMP     PROCEDURES Procedures   INITIAL IMPRESSION / ASSESSMENT AND PLAN / ED COURSE  Pertinent labs & imaging results that were available during my care of the patient were reviewed by me and considered in my medical decision making (see chart for details).  Well-appearing patient in no acute distress.  Chlamydia exposure.  Patient requests only gonorrhea and Chlamydia testing.  Due to direct potential exposure, prophylaxis 1 g oral azithromycin given once in urgent care.  Encourage pelvic rest for at least 1 week.  Discussed her follow-up and return parameters.  Monitor. Discussed follow up with Primary care physician this week. Discussed follow up and return parameters including no resolution or any worsening concerns. Patient verbalized understanding and agreed to plan.   ____________________________________________   FINAL CLINICAL IMPRESSION(S) / ED DIAGNOSES  Final diagnoses:  STD exposure  Exposure to chlamydia     ED Discharge Orders    None       Note: This dictation was prepared with Dragon dictation along with smaller phrase technology. Any transcriptional errors that result from this process are unintentional.         Renford Dills, NP 11/21/19 [phone]

## 2019-11-21 NOTE — Discharge Instructions (Signed)
Take medication as prescribed. Pelvic rest. Safe sex.   Follow up with your primary care physician this week as needed. Return to Urgent care for new or worsening concerns.

## 2019-11-21 NOTE — ED Triage Notes (Signed)
Patient states that she was told by her boyfriend that he is positive for chlamydia on Saturday. States that his doctor told him it could be from her. States that she is having no symptoms.

## 2019-11-22 ENCOUNTER — Encounter (HOSPITAL_COMMUNITY): Payer: Self-pay

## 2019-11-22 ENCOUNTER — Telehealth (HOSPITAL_COMMUNITY): Payer: Self-pay | Admitting: Emergency Medicine

## 2019-11-22 LAB — GC/CHLAMYDIA PROBE AMP
Chlamydia trachomatis, NAA: POSITIVE — AB
Neisseria Gonorrhoeae by PCR: NEGATIVE

## 2019-11-22 NOTE — Telephone Encounter (Signed)
Chlamydia is positive.  This was treated at the urgent care visit with po zithromax 1g.  Please refrain from sexual intercourse for 7 days to give the medicine time to work.  Sexual partners need to be notified and tested/treated.  Condoms may reduce risk of reinfection.  Recheck or followup with PCP for further evaluation if symptoms are not improving.  [facility] notified.  Attempted to reach patient. No answer at this time. Voicemail box full

## 2019-11-25 ENCOUNTER — Telehealth (HOSPITAL_COMMUNITY): Payer: Self-pay | Admitting: Emergency Medicine

## 2019-11-25 NOTE — Telephone Encounter (Signed)
Patient contacted by phone and made aware of  cytology  results. Pt verbalized understanding and had all questions answered.    

## 2019-12-17 NOTE — Progress Notes (Deleted)
ANNUAL PREVENTATIVE CARE GYN  ENCOUNTER NOTE  Subjective:       Alexa Diaz is a 29 y.o. No obstetric history on file. female here for a routine annual gynecologic exam.  Current complaints: 1.  ***    Gynecologic History No LMP recorded. Patient has had an implant. Contraception: {method:5051} Last Pap: ***. Results were: {norm/abn:16337} Last mammogram: ***. Results were: {norm/abn:16337}  Obstetric History OB History  No obstetric history on file.    Past Medical History:  Diagnosis Date  . Allergic rhinitis   . Back pain    3 bulging discs, arthritis and scoliosis 9/15  . Chronic post-traumatic stress disorder (PTSD)   . Herpes simplex virus infection    1 and 2  . 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 NO PAST SURGERIES      Current Outpatient Medications on File Prior to Visit  Medication Sig Dispense Refill  . Etonogestrel (NEXPLANON [place]) Inject into the skin.    . [DISCONTINUED] albuterol (PROVENTIL HFA;VENTOLIN HFA) 108 (90 Base) MCG/ACT inhaler Inhale 1-2 puffs into the lungs every 6 (six) hours as needed for wheezing or shortness of breath. Use with spacer 1 Inhaler 0   No current facility-administered medications on file prior to visit.    Allergies  Allergen Reactions  . Sertraline Hcl Other (See Comments)    Not effective  . Wellbutrin [Bupropion] Rash    Social History   Socioeconomic History  . Marital status: Divorced    Spouse name: Not on file  . Number of children: Not on file  . Years of education: Not on file  . Highest education level: Not on file  Occupational History  . Not on file  Tobacco Use  . Smoking status: Former Smoker    Packs/day: 0.33    Types: Cigarettes    Quit date: 04/15/2016    Years since quitting: 3.6  . Smokeless tobacco: Never Used  Substance and Sexual Activity  . Alcohol use: Yes    Comment: rare  . Drug use: Not  Currently    Types: Marijuana  . Sexual activity: Yes    Birth control/protection: Implant  Other Topics Concern  . Not on file  Social History Narrative  . Not on file   Social Determinants of Health   Financial Resource Strain:   . Difficulty of Paying Living Expenses: Not on file  Food Insecurity:   . Worried About Charity fundraiser in the Last Year: Not on file  . Ran Out of Food in the Last Year: Not on file  Transportation Needs:   . Lack of Transportation (Medical): Not on file  . Lack of Transportation (Non-Medical): Not on file  Physical Activity:   . Days of Exercise per Week: Not on file  . Minutes of Exercise per Session: Not on file  Stress:   . Feeling of Stress : Not on file  Social Connections:   . Frequency of Communication with Friends and Family: Not on file  . Frequency of Social Gatherings with Friends and Family: Not on file  . Attends Religious Services: Not on file  . Active Member of Clubs or Organizations: Not on file  . Attends Archivist Meetings: Not on file  . Marital Status: Not on file  Intimate Partner Violence:   . Fear of Current or Ex-Partner: Not on file  . Emotionally  Abused: Not on file  . Physically Abused: Not on file  . Sexually Abused: Not on file    Family History  Problem Relation Age of Onset  . Alcohol abuse Father   . Arthritis Father   . Hyperlipidemia Father   . Hypertension Father   . Thyroid disease Brother     The following portions of the patient's history were reviewed and updated as appropriate: allergies, current medications, past family history, past medical history, past social history, past surgical history and problem list.  Review of Systems ROS Review of Systems - General ROS: negative for - chills, fatigue, fever, hot flashes, night sweats, weight gain or weight loss Psychological ROS: negative for - anxiety, decreased libido, depression, mood swings, physical abuse or sexual  abuse Ophthalmic ROS: negative for - blurry vision, eye pain or loss of vision ENT ROS: negative for - headaches, hearing change, visual changes or vocal changes Allergy and Immunology ROS: negative for - hives, itchy/watery eyes or seasonal allergies Hematological and Lymphatic ROS: negative for - bleeding problems, bruising, swollen lymph nodes or weight loss Endocrine ROS: negative for - galactorrhea, hair pattern changes, hot flashes, malaise/lethargy, mood swings, palpitations, polydipsia/polyuria, skin changes, temperature intolerance or unexpected weight changes Breast ROS: negative for - new or changing breast lumps or nipple discharge Respiratory ROS: negative for - cough or shortness of breath Cardiovascular ROS: negative for - chest pain, irregular heartbeat, palpitations or shortness of breath Gastrointestinal ROS: no abdominal pain, change in bowel habits, or black or bloody stools Genito-Urinary ROS: no dysuria, trouble voiding, or hematuria Musculoskeletal ROS: negative for - joint pain or joint stiffness Neurological ROS: negative for - bowel and bladder control changes Dermatological ROS: negative for rash and skin lesion changes   Objective:   There were no vitals taken for this visit. CONSTITUTIONAL: Well-developed, well-nourished female in no acute distress.  PSYCHIATRIC: Normal mood and affect. Normal behavior. Normal judgment and thought content. NEUROLGIC: Alert and oriented to person, place, and time. Normal muscle tone coordination. No cranial nerve deficit noted. HENT:  Normocephalic, atraumatic, External right and left ear normal. Oropharynx is clear and moist EYES: Conjunctivae and EOM are normal. Pupils are equal, round, and reactive to light. No scleral icterus.  NECK: Normal range of motion, supple, no masses.  Normal thyroid.  SKIN: Skin is warm and dry. No rash noted. Not diaphoretic. No erythema. No pallor. CARDIOVASCULAR: Normal heart rate noted, regular  rhythm, no murmur. RESPIRATORY: Clear to auscultation bilaterally. Effort and breath sounds normal, no problems with respiration noted. BREASTS: Symmetric in size. No masses, skin changes, nipple drainage, or lymphadenopathy. ABDOMEN: Soft, normal bowel sounds, no distention noted.  No tenderness, rebound or guarding.  BLADDER: Normal PELVIC:  External Genitalia: Normal  BUS: Normal  Vagina: Normal  Cervix: Normal  Uterus: Normal  Adnexa: Normal  RV: {Blank multiple:19196::"External Exam NormaI","No Rectal Masses","Normal Sphincter tone"}  MUSCULOSKELETAL: Normal range of motion. No tenderness.  No cyanosis, clubbing, or edema.  2+ distal pulses. LYMPHATIC: No Axillary, Supraclavicular, or Inguinal Adenopathy.    Assessment:   Annual gynecologic examination 29 y.o. Contraception: {method:5051} {Blank multiple:19196::"Normal BMI","Overweight","Obesity 1","Obesity 2"} Problem List Items Addressed This Visit    None    Visit Diagnoses    Well woman exam    -  Primary   Screening for cervical cancer       Nexplanon in place       Routine screening for STI (sexually transmitted infection)  History of chlamydia          Plan:  Pap: {Blank multiple:19196::"Pap, Reflex if ASCUS","Pap Co Test","GC/CT NAAT","Not needed","Not done"} Mammogram: {Blank multiple:19196::"Ordered","Not Ordered","Not Indicated","***"} Stool Guaiac Testing:  {Blank multiple:19196::"Ordered","Not Ordered","Not Indicated","***"} Labs: {Blank multiple:19196::"Lipid 1","FBS","TSH","Hemoglobin A1C","Vit D Level""***"} Routine preventative health maintenance measures emphasized: {Blank multiple:19196::"Exercise/Diet/Weight control","Tobacco Warnings","Alcohol/Substance use risks","Stress Management","Peer Pressure Issues","Safe Sex"} *** Return to Clinic - 1 Year   Gunnar Bulla, CNM [facility], [facility] 12/17/19 12:33 AM

## 2019-12-22 ENCOUNTER — Encounter: Payer: Medicaid Other | Admitting: Certified Nurse Midwife

## 2020-02-02 ENCOUNTER — Encounter: Payer: Medicaid Other | Admitting: Certified Nurse Midwife

## 2020-03-27 ENCOUNTER — Telehealth: Payer: Self-pay | Admitting: Certified Nurse Midwife

## 2020-03-27 NOTE — Telephone Encounter (Signed)
Called pt to reschedule her last canceled appt. Pt answered and stated that right now isn't a good time and that she will call when she can come in. I told the pt I would take her off the list and we look forward to her call when she is ready to make her appointment. Please advise

## 2020-05-28 ENCOUNTER — Ambulatory Visit
Admission: RE | Admit: 2020-05-28 | Discharge: 2020-05-28 | Disposition: A | Discharge status: Home / Self Care | Payer: BLUE CROSS/BLUE SHIELD | Source: Ambulatory Visit | Attending: Family Medicine | Admitting: Family Medicine

## 2020-05-28 ENCOUNTER — Other Ambulatory Visit: Payer: Self-pay

## 2020-05-28 VITALS — BP 138/94 | HR 78 | Temp 98.3°F | Resp 18 | Ht 65.0 in | Wt 285.1 lb

## 2020-05-28 DIAGNOSIS — H10021 Other mucopurulent conjunctivitis, right eye: Secondary | ICD-10-CM | POA: Diagnosis not present

## 2020-05-28 DIAGNOSIS — L03213 Periorbital cellulitis: Secondary | ICD-10-CM | POA: Diagnosis not present

## 2020-05-28 MED ORDER — MOXIFLOXACIN HCL 0.5 % OP SOLN: 1.0000 [drp] | Freq: Three times a day (TID) | OPHTHALMIC | 0 refills | Status: DC

## 2020-05-28 MED ORDER — CEPHALEXIN 500 MG PO CAPS: 500.0000 mg | ORAL_CAPSULE | Freq: Three times a day (TID) | ORAL | 0 refills | Status: DC

## 2020-05-28 NOTE — ED Triage Notes (Signed)
Pt c/o eye pain, swelling, burning and redness. Started yesterday. Eye is watery and the side of her head is also sore.

## 2020-05-28 NOTE — Discharge Instructions (Signed)
Cool compresses to area Tylenol as needed

## 2020-05-28 NOTE — ED Provider Notes (Signed)
[facility]    CSN: [id number] Arrival date & time: 05/28/20  1305      History   Chief Complaint Chief Complaint  Patient presents with  . Appointment  . Eye Problem    right    HPI Alexa Diaz is a 29 y.o. female.   29 yo female with a c/o right eye redness, drainage as well as eyelid swelling, redness and tenderness for the past 2 days.  Denies any injuries, fevers, chills.     Past Medical History:  Diagnosis Date  . Allergic rhinitis   . Back pain    3 bulging discs, arthritis and scoliosis 9/15  . Chronic post-traumatic stress disorder (PTSD)   . Herpes simplex virus infection    1 and 2  . Reduction defect of left lower extremity    Short L leg- wears lift    Patient Active Problem List   Diagnosis Date Noted  . Class 3 obesity due to excess calories without serious comorbidity with body mass index (BMI) of 40.0 to 44.9 in adult 11/17/2016  . Urticaria 07/10/2015  . Leg length discrepancy 06/11/2015  . Low back pain 06/11/2015  . Herpes simplex virus infection   . Allergic rhinitis   . Chronic post-traumatic stress disorder (PTSD)     Past Surgical History:  Procedure Laterality Date  . NO PAST SURGERIES      OB History   No obstetric history on file.      Home Medications    Prior to Admission medications   Medication Sig Start Date End Date Taking? Authorizing Provider  Etonogestrel (NEXPLANON [place]) Inject into the skin.   Yes [provider]  cephALEXin (KEFLEX) 500 MG capsule Take 1 capsule (500 mg total) by mouth 3 (three) times daily. 05/28/20   Payton Mccallum, MD  moxifloxacin (VIGAMOX) 0.5 % ophthalmic solution Place 1 drop into the right eye 3 (three) times daily. 05/28/20   Payton Mccallum, MD  albuterol (PROVENTIL HFA;VENTOLIN HFA) 108 (90 Base) MCG/ACT inhaler Inhale 1-2 puffs into the lungs every 6 (six) hours as needed for wheezing or shortness of breath. Use with spacer 07/17/17 11/21/19  Lutricia Feil, PA-C      Family History Family History  Problem Relation Age of Onset  . Alcohol abuse Father   . Arthritis Father   . Hyperlipidemia Father   . Hypertension Father   . Thyroid disease Brother     Social History Social History   Tobacco Use  . Smoking status: Former Smoker    Packs/day: 0.33    Types: Cigarettes    Quit date: 04/15/2016    Years since quitting: 4.1  . Smokeless tobacco: Never Used  Vaping Use  . Vaping Use: Every day  Substance Use Topics  . Alcohol use: Yes    Comment: rare  . Drug use: Not Currently    Types: Marijuana     Allergies   Sertraline hcl and Wellbutrin [bupropion]   Review of Systems Review of Systems   Physical Exam Triage Vital Signs ED Triage Vitals  Enc Vitals Group     BP 05/28/20 1358 (!) 138/94     Pulse Rate 05/28/20 1358 78     Resp 05/28/20 1358 18     Temp 05/28/20 1358 98.3 F (36.8 C)     Temp Source 05/28/20 1358 Oral     SpO2 05/28/20 1358 100 %     Weight 05/28/20 1355 285 lb 0.9 oz (129.3 kg)  Height 05/28/20 1355 5\' 5"  (1.651 m)     Head Circumference --      Peak Flow --      Pain Score 05/28/20 1354 10     Pain Loc --      Pain Edu? --      Excl. in GC? --    No data found.  Updated Vital Signs BP (!) 138/94 (BP Location: Left Arm)   Pulse 78   Temp 98.3 F (36.8 C) (Oral)   Resp 18   Ht 5\' 5"  (1.651 m)   Wt 129.3 kg   SpO2 100%   BMI 47.44 kg/m   Visual Acuity Right Eye Distance:   Left Eye Distance:   Bilateral Distance:    Right Eye Near:   Left Eye Near:    Bilateral Near:     Physical Exam Vitals and nursing note reviewed.  Constitutional:      General: She is not in acute distress.    Appearance: She is not toxic-appearing or diaphoretic.  Eyes:     General:        Right eye: Discharge present.     Extraocular Movements: Extraocular movements intact.     Conjunctiva/sclera:     Right eye: Right conjunctiva is injected. Exudate present.     Pupils: Pupils are equal,  round, and reactive to light.     Comments: Right upper eyelid with edema, blanchable erythema, warmth and tenderness to palpation  Neurological:     Mental Status: She is alert.      UC Treatments / Results  Labs (all labs ordered are listed, but only abnormal results are displayed) Labs Reviewed - No data to display  EKG   Radiology No results found.  Procedures Procedures (including critical care time)  Medications Ordered in UC Medications - No data to display  Initial Impression / Assessment and Plan / UC Course  I have reviewed the triage vital signs and the nursing notes.  Pertinent labs & imaging results that were available during my care of the patient were reviewed by me and considered in my medical decision making (see chart for details).      Final Clinical Impressions(s) / UC Diagnoses   Final diagnoses:  Other mucopurulent conjunctivitis of right eye  Preseptal cellulitis of right eye     Discharge Instructions     Cool compresses to area Tylenol as needed    ED Prescriptions    Medication Sig Dispense Auth. Provider   moxifloxacin (VIGAMOX) 0.5 % ophthalmic solution Place 1 drop into the right eye 3 (three) times daily. 3 mL 07/28/20, MD   cephALEXin (KEFLEX) 500 MG capsule Take 1 capsule (500 mg total) by mouth 3 (three) times daily. 30 capsule , MD      1. diagnosis reviewed with patient 2. rx as per orders above; reviewed possible side effects, interactions, risks and benefits  3. Recommend supportive treatment as above 4. Follow-up prn if symptoms worsen or don't improve  PDMP not reviewed this encounter.   Payton Mccallum, MD 05/28/20 1650

## 2020-05-29 ENCOUNTER — Ambulatory Visit
Admission: RE | Admit: 2020-05-29 | Discharge: 2020-05-29 | Disposition: A | Discharge status: Home / Self Care | Payer: BLUE CROSS/BLUE SHIELD | Source: Ambulatory Visit | Attending: Family Medicine | Admitting: Family Medicine

## 2020-05-29 ENCOUNTER — Other Ambulatory Visit: Payer: Self-pay

## 2020-05-29 VITALS — BP 141/89 | HR 57 | Temp 98.6°F | Resp 18 | Ht 65.0 in | Wt 285.1 lb

## 2020-05-29 DIAGNOSIS — H5789 Other specified disorders of eye and adnexa: Secondary | ICD-10-CM

## 2020-05-29 NOTE — ED Triage Notes (Signed)
Patient c/o right eye swelling, pain, burning and redness that started on Saturday. She was seen yesterday and was told she had conjunctivitis and was given eye drops and an antibiotic. She has taken these and woke up this morning with her eye worse.

## 2020-05-29 NOTE — Discharge Instructions (Signed)
Go straight to [facility] - [address] [place], [place] [address]  Tell them you were sent from Urgent care  Take care  Dr. Adriana Simas

## 2020-05-29 NOTE — ED Provider Notes (Signed)
[facility]    CSN: [id number] Arrival date & time: 05/29/20  1258      History   Chief Complaint Chief Complaint  Patient presents with  . Appointment  . Eye Problem    HPI  29 year old female presents with the above complaint.  Seen here yesterday.  Diagnosed with conjunctivitis and preseptal cellulitis.  Was placed on Keflex and Vigamox.  Patient states that the drainage from her right eye has improved.  However, he remains red and she has worsening swelling of the right eye.  Patient is concerned given worsening symptoms.  She does presents for repeat evaluation.  No fever.  No known relieving factors.  Patient states that this started on Sunday with a bump on the eyelid.  She thought that this was a stye and did over-the-counter treatment but had worsening.  No vision changes.  No other associated symptoms.  No other complaints.  Past Medical History:  Diagnosis Date  . Allergic rhinitis   . Back pain    3 bulging discs, arthritis and scoliosis 9/15  . Chronic post-traumatic stress disorder (PTSD)   . Herpes simplex virus infection    1 and 2  . Reduction defect of left lower extremity    Short L leg- wears lift    Patient Active Problem List   Diagnosis Date Noted  . Class 3 obesity due to excess calories without serious comorbidity with body mass index (BMI) of 40.0 to 44.9 in adult 11/17/2016  . Urticaria 07/10/2015  . Leg length discrepancy 06/11/2015  . Low back pain 06/11/2015  . Herpes simplex virus infection   . Allergic rhinitis   . Chronic post-traumatic stress disorder (PTSD)     Past Surgical History:  Procedure Laterality Date  . NO PAST SURGERIES      OB History   No obstetric history on file.      Home Medications    Prior to Admission medications   Medication Sig Start Date End Date Taking? Authorizing Provider  cephALEXin (KEFLEX) 500 MG capsule Take 1 capsule (500 mg total) by mouth 3 (three) times daily. 05/28/20  Yes  Payton Mccallum, MD  Etonogestrel [facility]) Inject into the skin.   Yes [provider]  moxifloxacin (VIGAMOX) 0.5 % ophthalmic solution Place 1 drop into the right eye 3 (three) times daily. 05/28/20  Yes Payton Mccallum, MD  albuterol (PROVENTIL HFA;VENTOLIN HFA) 108 (90 Base) MCG/ACT inhaler Inhale 1-2 puffs into the lungs every 6 (six) hours as needed for wheezing or shortness of breath. Use with spacer 07/17/17 11/21/19  Lutricia Feil, PA-C    Family History Family History  Problem Relation Age of Onset  . Alcohol abuse Father   . Arthritis Father   . Hyperlipidemia Father   . Hypertension Father   . Thyroid disease Brother     Social History Social History   Tobacco Use  . Smoking status: Former Smoker    Packs/day: 0.33    Types: Cigarettes    Quit date: 04/15/2016    Years since quitting: 4.1  . Smokeless tobacco: Never Used  Vaping Use  . Vaping Use: Every day  Substance Use Topics  . Alcohol use: Yes    Comment: rare  . Drug use: Not Currently    Types: Marijuana     Allergies   Sertraline hcl and Wellbutrin [bupropion]   Review of Systems Review of Systems  Constitutional: Negative.   Eyes: Positive for discharge and redness.  Physical Exam Triage Vital Signs ED Triage Vitals  Enc Vitals Group     BP 05/29/20 1308 (!) 141/89     Pulse Rate 05/29/20 1308 (!) 57     Resp 05/29/20 1308 18     Temp 05/29/20 1308 98.6 F (37 C)     Temp Source 05/29/20 1308 Oral     SpO2 05/29/20 1308 100 %     Weight 05/29/20 1309 285 lb 0.9 oz (129.3 kg)     Height 05/29/20 1309 5\' 5"  (1.651 m)     Head Circumference --      Peak Flow --      Pain Score 05/29/20 1309 10     Pain Loc --      Pain Edu? --      Excl. in GC? --    Updated Vital Signs BP (!) 141/89 (BP Location: Left Arm)   Pulse (!) 57   Temp 98.6 F (37 C) (Oral)   Resp 18   Ht 5\' 5"  (1.651 m)   Wt 129.3 kg   SpO2 100%   BMI 47.44 kg/m   Visual Acuity Right Eye Distance:    Left Eye Distance:   Bilateral Distance:    Right Eye Near:   Left Eye Near:    Bilateral Near:     Physical Exam Vitals and nursing note reviewed.  Constitutional:      General: She is not in acute distress.    Appearance: She is not ill-appearing.  HENT:     Head: Normocephalic and atraumatic.  Eyes:     Pupils: Pupils are equal, round, and reactive to light.     Comments: Right eye with conjunctival injection.  Periorbital edema noted, especially of the upper eyelid.  No current purulent drainage.  Pulmonary:     Effort: Pulmonary effort is normal. No respiratory distress.  Neurological:     Mental Status: She is alert.  Psychiatric:        Mood and Affect: Mood normal.        Behavior: Behavior normal.    UC Treatments / Results  Labs (all labs ordered are listed, but only abnormal results are displayed) Labs Reviewed - No data to display  EKG   Radiology No results found.  Procedures Procedures (including critical care time)  Medications Ordered in UC Medications - No data to display  Initial Impression / Assessment and Plan / UC Course  I have reviewed the triage vital signs and the nursing notes.  Pertinent labs & imaging results that were available during my care of the patient were reviewed by me and considered in my medical decision making (see chart for details).    29 year old female presents with suspected conjunctivitis.  Unclear if the periorbital edema is related to stye, allergy, or true infection.  Advised the patient to see ophthalmology.  I spoke with [facility] and she will be seen there this afternoon.  Final Clinical Impressions(s) / UC Diagnoses   Final diagnoses:  Eye swelling  Eye redness     Discharge Instructions     Go straight to [facility] - [address], [place], [address] [place]  Tell them you were sent from Urgent care  Take care  Dr. [place]    ED Prescriptions    None     PDMP not reviewed this  encounter.   [id number], Adriana Simas 05/29/20 1445

## 2020-09-03 ENCOUNTER — Ambulatory Visit: Payer: Self-pay | Admitting: *Deleted

## 2020-09-03 NOTE — Telephone Encounter (Signed)
C/o chest pressure and tightness in chest since yesterday due to possible covid symptoms. Patient tested at [facility] on Sunday 09/02/20 awaiting results. C/o chest congestion and coughing up greenish-yellow sputum. Exposed to boyfriend on 08/28/20 and started having symptoms on Saturday 09/01/20. C/o headache, fatigue, "wet cough" sinus pain, chills. Denies difficulty breathing but reports feeling her breathing is shallow at times and chest tightness with taking in a breath. Unsure if she has fever but reports she did have chills prior to today. Noticed greenish- yellow sputum after coughing last night. Encouraged patient to go to ED if chest thightness persists or increasing difficulty breathing noted. Virtual appt made for 09/07/20. Patient reports she still has taste and smell. Care advise given. Patient verbalized understanding of care advise and to call back and to go to ED if symptoms worsen. Patient also verbalized understanding of isolation precautions if test is positive for covid. Please advise if earlier appt available .  Reason for Disposition  HIGH RISK for severe COVID complications (e.g., age > 64 years, obesity with BMI > 25, pregnant, chronic lung disease or other chronic medical condition)  (Exception: Already seen by PCP and no new or worsening symptoms.)  Answer Assessment - Initial Assessment Questions 1. COVID-19 EXPOSURE: "Please describe how you were exposed to someone with a COVID-19 infection."     From boyfriend  2. PLACE of CONTACT: "Where were you when you were exposed to COVID-19?" (e.g., home, school, medical waiting room; which city?)     Home  3. TYPE of CONTACT: "How much contact was there?" (e.g., sitting next to, live in same house, work in same office, same building)     Sitting next to , same house  4. DURATION of CONTACT: "How long were you in contact with the COVID-19 patient?" (e.g., a few seconds, passed by person, a few minutes, 15 minutes or longer, live with the  patient)     15 minutes or longer  5. MASK: "Were you wearing a mask?" "Was the other person wearing a mask?" Note: wearing a mask reduces the risk of an otherwise close contact.     no 6. DATE of CONTACT: "When did you have contact with a COVID-19 patient?" (e.g., how many days ago)     11 /2/21 7. COMMUNITY SPREAD: "Are there lots of cases of COVID-19 (community spread) where you live?" (See public health department website, if unsure)       No sure  8. SYMPTOMS: "Do you have any symptoms?" (e.g., fever, cough, breathing difficulty, loss of taste or smell)     Cough, chest tightness, shallow breathing, headache, fatigue, chills  9. PREGNANCY OR POSTPARTUM: "Is there any chance you are pregnant?" "When was your last menstrual period?" "Did you deliver in the last 2 weeks?"     no 10. HIGH RISK: "Do you have any heart or lung problems?" "Do you have a weak immune system?" (e.g., heart failure, COPD, asthma, HIV positive, chemotherapy, renal failure, diabetes mellitus, sickle cell anemia, obesity)       obese 11. TRAVEL: "Have you traveled out of the country recently?" If Yes, ask: "When and where?" Also ask about out-of-state travel, since the CDC has identified some high-risk cities for community spread in the 04[birth year]02-1970. Note: Travel becomes less relevant if there is widespread community transmission where the patient lives.       no  Answer Assessment - Initial Assessment Questions 1. COVID-19 DIAGNOSIS: "Who made your Coronavirus (COVID-19) diagnosis?" "Was it confirmed by a  positive lab test?" If not diagnosed by a HCP, ask "Are there lots of cases (community spread) where you live?" (See public health department website, if unsure)     No results at this time. Test[facility]  2. COVID-19 EXPOSURE: "Was there any known exposure to COVID before the symptoms began?" CDC Definition of close contact: within 6 feet (2 meters) for a total of 15 minutes or more over a 24-hour period.      Yes  boyfriend on 08/28/20 3. ONSET: "When did the COVID-19 symptoms start?"      Saturday 09/01/20 4. WORST SYMPTOM: "What is your worst symptom?" (e.g., cough, fever, shortness of breath, muscle aches)     Chest tightness, cough, chest congestion 5. COUGH: "Do you have a cough?" If Yes, ask: "How bad is the cough?"       Yes. Getting worse 6. FEVER: "Do you have a fever?" If Yes, ask: "What is your temperature, how was it measured, and when did it start?"     Not sure but has had chills  7. RESPIRATORY STATUS: "Describe your breathing?" (e.g., shortness of breath, wheezing, unable to speak)      Tightness in chest , shallow breathing, productive cough greenish , yellow sputum 8. BETTER-SAME-WORSE: "Are you getting better, staying the same or getting worse compared to yesterday?"  If getting worse, ask, "In what way?"     Getting worse  9. HIGH RISK DISEASE: "Do you have any chronic medical problems?" (e.g., asthma, heart or lung disease, weak immune system, obesity, etc.)     Obesity  10. PREGNANCY: "Is there any chance you are pregnant?" "When was your last menstrual period?"       no 11. OTHER SYMPTOMS: "Do you have any other symptoms?"  (e.g., chills, fatigue, headache, loss of smell or taste, muscle pain, sore throat; new loss of smell or taste especially support the diagnosis of COVID-19)       Chills, fatigue, headache, chest congestion, cough, tightness in chest  Protocols used: CORONAVIRUS (COVID-19) DIAGNOSED OR SUSPECTED-A-AH, CORONAVIRUS (COVID-19) EXPOSURE-A-AH

## 2020-09-04 ENCOUNTER — Encounter: Payer: Self-pay | Admitting: Family Medicine

## 2020-09-04 ENCOUNTER — Telehealth (INDEPENDENT_AMBULATORY_CARE_PROVIDER_SITE_OTHER): Payer: BLUE CROSS/BLUE SHIELD | Admitting: Family Medicine

## 2020-09-04 DIAGNOSIS — U071 COVID-19: Secondary | ICD-10-CM | POA: Diagnosis not present

## 2020-09-04 MED ORDER — HYDROCOD POLST-CPM POLST ER 10-8 MG/5ML PO SUER: 5.0000 mL | Freq: Two times a day (BID) | ORAL | 0 refills | Status: DC | PRN

## 2020-09-04 MED ORDER — BENZONATATE 200 MG PO CAPS: 200.0000 mg | ORAL_CAPSULE | Freq: Two times a day (BID) | ORAL | 0 refills | Status: DC | PRN

## 2020-09-04 MED ORDER — ALBUTEROL SULFATE HFA 108 (90 BASE) MCG/ACT IN AERS: 2.0000 | INHALATION_SPRAY | Freq: Four times a day (QID) | RESPIRATORY_TRACT | 0 refills | Status: DC | PRN

## 2020-09-04 NOTE — Progress Notes (Signed)
There were no vitals taken for this visit.   Subjective:    Patient ID: Alexa Diaz, female    DOB: 12/14/1990, 29 y.o.   MRN: [id number]  HPI: Alexa Diaz is a 29 y.o. female  Chief Complaint  Patient presents with  . Headache    pt states headache started on saturday 11/6, tested postive for COVID-19   . Cough    pt states she has a cough since 11/6, feels occasional shortness of breath. Pt states she feels congestion in her chest    UPPER RESPIRATORY TRACT INFECTION- tested positive for COVID Duration:  11/6 (3-4 days) Worst symptom: headache, chills, cough Fever: no Cough: yes Shortness of breath: yes Wheezing: yes Chest pain: no Chest tightness: yes Chest congestion: yes Nasal congestion: no Runny nose: yes Post nasal drip: no Sneezing: no Sore throat: no Swollen glands: no Sinus pressure: yes Headache: yes Face pain: no Toothache: no Ear pain: no  Ear pressure: no  Eyes red/itching:no Eye drainage/crusting: no  Vomiting: no Rash: no Fatigue: yes Sick contacts: yes Strep contacts: no  Context: stable Recurrent sinusitis: no Relief with OTC cold/cough medications: no  Treatments attempted: none   Relevant past medical, surgical, family and social history reviewed and updated as indicated. Interim medical history since our last visit reviewed. Allergies and medications reviewed and updated.  Review of Systems  Constitutional: Positive for chills, diaphoresis and fatigue. Negative for activity change, appetite change, fever and unexpected weight change.  HENT: Positive for congestion and rhinorrhea. Negative for dental problem, drooling, ear discharge, ear pain, facial swelling, hearing loss, mouth sores, nosebleeds, postnasal drip, sinus pressure, sinus pain, sneezing, sore throat, tinnitus, trouble swallowing and voice change.   Respiratory: Positive for cough, chest tightness, shortness of breath and wheezing. Negative for apnea, choking and  stridor.   Cardiovascular: Negative.   Gastrointestinal: Negative.   Musculoskeletal: Negative.   Neurological: Negative.   Psychiatric/Behavioral: Negative.     Per HPI unless specifically indicated above     Objective:    There were no vitals taken for this visit.  Wt Readings from Last 3 Encounters:  05/29/20 285 lb 0.9 oz (129.3 kg)  05/28/20 285 lb 0.9 oz (129.3 kg)  11/21/19 285 lb (129.3 kg)    Physical Exam Vitals and nursing note reviewed.  Constitutional:      General: She is not in acute distress.    Appearance: Normal appearance. She is not ill-appearing, toxic-appearing or diaphoretic.  HENT:     Head: Normocephalic and atraumatic.     Right Ear: External ear normal.     Left Ear: External ear normal.     Nose: Nose normal.     Mouth/Throat:     Mouth: Mucous membranes are moist.     Pharynx: Oropharynx is clear.  Eyes:     General: No scleral icterus.       Right eye: No discharge.        Left eye: No discharge.     Conjunctiva/sclera: Conjunctivae normal.     Pupils: Pupils are equal, round, and reactive to light.  Pulmonary:     Effort: Pulmonary effort is normal. No respiratory distress.     Comments: Speaking in full sentences Musculoskeletal:        General: Normal range of motion.     Cervical back: Normal range of motion.  Skin:    Coloration: Skin is not jaundiced or pale.     Findings: No bruising, erythema, lesion or  rash.  Neurological:     Mental Status: She is alert and oriented to person, place, and time. Mental status is at baseline.  Psychiatric:        Mood and Affect: Mood normal.        Behavior: Behavior normal.        Thought Content: Thought content normal.        Judgment: Judgment normal.     Results for orders placed or performed during the hospital encounter of 11/21/19  GC/Chlamydia Probe Amp   Specimen: Vaginal  Result Value Ref Range   Chlamydia trachomatis, NAA Positive (A) Negative   Neisseria Gonorrhoeae by  PCR Negative Negative   CT/NG NAA Source ENDOCERVICAL       Assessment & Plan:   Problem List Items Addressed This Visit    None    Visit Diagnoses    COVID-19    -  Primary   Will get in touch with monoclonal antibody group. Tussionex, tessalon perles and albuterol for comfort. Call with any cocerns or if not getting better.        Follow up plan: Return if symptoms worsen or fail to improve.   . This visit was completed via MyChart due to the restrictions of the COVID-19 pandemic. All issues as above were discussed and addressed. Physical exam was done as above through visual confirmation on MyChart. If it was felt that the patient should be evaluated in the office, they were directed there. The patient verbally consented to this visit. . Location of the patient: home . Location of the provider: work . Those involved with this call:  . Provider: Olevia Perches, DO . CMA: Rolley Sims, CMA . Front Desk/Registration: Harriet Pho  . Time spent on call: 15 minutes with patient face to face via video conference. More than 50% of this time was spent in counseling and coordination of care. 23 minutes total spent in review of patient's record and preparation of their chart.

## 2020-09-05 ENCOUNTER — Telehealth: Payer: Self-pay | Admitting: Unknown Physician Specialty

## 2020-09-05 ENCOUNTER — Telehealth (HOSPITAL_COMMUNITY): Payer: Self-pay

## 2020-09-05 NOTE — Telephone Encounter (Signed)
I connected by phone with Alexa Diaz on 09/05/2020 at 1:09 PM to discuss the potential use of a new treatment for mild to moderate COVID-19 viral infection in non-hospitalized patients.  This patient is a 28 y.o. female that meets the FDA criteria for Emergency Use Authorization of COVID monoclonal antibody casirivimab/imdevimab, bamlanivimab/eteseviamb, or sotrovimab.  Has a (+) direct SARS-CoV-2 viral test result  Has mild or moderate COVID-19   Is NOT hospitalized due to COVID-19  Is within 10 days of symptom onset  Has at least one of the high risk factor(s) for progression to severe COVID-19 and/or hospitalization as defined in EUA.  Specific high risk criteria : BMI > 25   I have spoken and communicated the following to the patient or parent/caregiver regarding COVID monoclonal antibody treatment:  1. FDA has authorized the emergency use for the treatment of mild to moderate COVID-19 in adults and pediatric patients with positive results of direct SARS-CoV-2 viral testing who are 64 years of age and older weighing at least 40 kg, and who are at high risk for progressing to severe COVID-19 and/or hospitalization.  2. The significant known and potential risks and benefits of COVID monoclonal antibody, and the extent to which such potential risks and benefits are unknown.  3. Information on available alternative treatments and the risks and benefits of those alternatives, including clinical trials.  4. Patients treated with COVID monoclonal antibody should continue to self-isolate and use infection control measures (e.g., wear mask, isolate, social distance, avoid sharing personal items, clean and disinfect high touch surfaces, and frequent handwashing) according to CDC guidelines.   5. The patient or parent/caregiver has the option to accept or refuse COVID monoclonal antibody treatment.  After reviewing this information with the patient, the patient has DECLINED offer to  receive the infusion. Gabriel Cirri, NP 09/05/2020 1:09 PM  Sx onset 11/4.  Declining due to cost.  Hotline given and timing parameters

## 2020-09-05 NOTE — Telephone Encounter (Signed)
Called to Discuss with patient about Covid symptoms and the use of the monoclonal antibody infusion for those with mild to moderate Covid symptoms and at a high risk of hospitalization.     Pt appears to qualify for this infusion due to co-morbid conditions and/or a member of an at-risk group in accordance with the FDA Emergency Use Authorization.    Risk factors include BMI >25, currently vapes, former smoker.  Sx onset 09/01/20. Tested positive for COVID 19 at [facility] on 09/02/20.  Pre-screened by RN and ready for APP to call and further inform and/or schedule appt for infusion.

## 2020-09-06 ENCOUNTER — Telehealth: Payer: Self-pay

## 2020-09-06 ENCOUNTER — Encounter: Payer: Self-pay | Admitting: Family Medicine

## 2020-09-06 NOTE — Telephone Encounter (Signed)
Patient called and asked when is she able to come off of quarantine and retest, since she tested positive for COVID. She says her symptoms started on 09/01/20. I advised CDC guidelines for quarantine, 10 days from onset of symptoms or 14 days for know exposure, advised ending quarantine, advised on no retesting per CDC guidelines, but retest after quarantine if she will need it based on her employer or school recommendation. Patient verbalized understanding.

## 2020-09-07 ENCOUNTER — Telehealth: Payer: Self-pay | Admitting: Family Medicine

## 2020-09-14 ENCOUNTER — Other Ambulatory Visit: Payer: Self-pay | Admitting: Family Medicine

## 2020-09-14 MED ORDER — BENZONATATE 200 MG PO CAPS
200.0000 mg | ORAL_CAPSULE | Freq: Two times a day (BID) | ORAL | 0 refills | Status: DC | PRN
Start: 2020-09-14 — End: 2021-01-04

## 2021-01-04 ENCOUNTER — Encounter: Payer: Self-pay | Admitting: Certified Nurse Midwife

## 2021-01-04 ENCOUNTER — Other Ambulatory Visit: Payer: Self-pay

## 2021-01-04 ENCOUNTER — Ambulatory Visit (INDEPENDENT_AMBULATORY_CARE_PROVIDER_SITE_OTHER): Payer: BLUE CROSS/BLUE SHIELD | Admitting: Certified Nurse Midwife

## 2021-01-04 VITALS — BP 125/82 | HR 59 | Ht 65.0 in | Wt 256.0 lb

## 2021-01-04 DIAGNOSIS — Z3046 Encounter for surveillance of implantable subdermal contraceptive: Secondary | ICD-10-CM | POA: Diagnosis not present

## 2021-01-04 NOTE — Patient Instructions (Signed)
Nexplanon Instructions After Insertion/Removal   Keep bandage clean and dry for 24 hours   May use ice/Tylenol/Ibuprofen for soreness or pain   If you develop fever, drainage or increased warmth from incision site-contact office immediately   Norethindrone Acetate; Ethinyl Estradiol; Ferrous Fumarate Capsules or Tablets What is this medicine? NORETHINDRONE; ETHINYL ESTRADIOL; FERROUS FUMARATE (nor eth IN drone; ETH in il es tra DYE ole; FER [place] FUE ma rate) is an oral contraceptive. The products combine two types of female hormones, an estrogen and a progestin. These products prevent ovulation and pregnancy. This medicine may be used for other purposes; ask your health care provider or pharmacist if you have questions. COMMON BRAND NAME(S): Aurovela [address] 1/20, [address] [place] [address] Fe, [place], [place], [address], [address] Fe 1.5/30, [place] Fe 1/20, Hailey 24 Fe, Hailey Fe 1.5/30, Junel Fe 1.5/30, Junel Fe 1/20, Junel Fe 24, [place] Fe, Lo Loestrin Fe, Loestrin 24 Fe, Loestrin FE 1.5/30, Loestrin FE 1/20, Lomedia 24 Fe, [place], Microgestin 24 Fe, Microgestin Fe 1.5/30, Microgestin Fe 1/20, Tarina 24 Fe, Tarina Fe 1/20, Taysofy, Taytulla, Tilia Fe, Tri-Legest Fe What should I tell my health care provider before I take this medicine? They need to know if you have any of these conditions:  abnormal vaginal bleeding  blood vessel disease or blood clots  breast, cervical, endometrial, ovarian, liver, or uterine cancer  diabetes  gallbladder disease  having surgery  heart disease or recent heart attack  high blood pressure  high cholesterol or triglycerides  history of irregular heartbeat or heart valve problems  kidney disease  liver disease  migraine headaches  protein C deficiency  protein S deficiency  recently had a baby, miscarriage, or abortion  stroke  systemic lupus erythematosus (SLE)  tobacco smoker  an unusual or allergic reaction  to estrogens, progestins, other medicines, foods, dyes, or preservatives  pregnant or trying to get pregnant  breast-feeding How should I use this medicine? Take this medicine by mouth. To reduce nausea, this medicine may be taken with food. Follow the directions on the prescription label. Take this medicine at the same time each day and in the order directed on the package. Do not take your medicine more often than directed. A patient package insert for the product will be given with each prescription and refill. Read this sheet carefully each time. The sheet may change frequently. Contact your pediatrician regarding the use of this medicine in children. Special care may be needed. This medicine has been used in female children who have started having menstrual periods. Overdosage: If you think you have taken too much of this medicine contact a poison control center or emergency room at once. NOTE: This medicine is only for you. Do not share this medicine with others. What if I miss a dose? If you miss a dose, refer to the patient information sheet you received with your medication for direction. If you miss more than one pill, this medication may not be as effective, and you may need to use another form of birth control. What may interact with this medicine? Do not take this medicine with the following medication:  dasabuvir; ombitasvir; paritaprevir; ritonavir  ombitasvir; paritaprevir; ritonavir This medicine may also interact with the following medications:  acetaminophen  antibiotics or medicines for infections, especially rifampin, rifabutin, rifapentine, and griseofulvin, and possibly penicillins or tetracyclines  aprepitant  ascorbic acid (vitamin C)  atorvastatin  barbiturate medicines, such as phenobarbital  bosentan  carbamazepine  caffeine  clofibrate  cyclosporine  dantrolene  doxercalciferol  felbamate  grapefruit juice  hydrocortisone  medicines  for anxiety or sleeping problems, such as diazepam or temazepam  medicines for diabetes, including pioglitazone  mineral oil  modafinil  mycophenolate  nefazodone  oxcarbazepine  phenytoin  prednisolone  ritonavir or other medicines for HIV infection or AIDS  rosuvastatin  selegiline  soy isoflavones supplements  St. John's wort  tamoxifen or raloxifene  theophylline  thyroid hormones  topiramate  warfarin This list may not describe all possible interactions. Give your health care provider a list of all the medicines, herbs, non-prescription drugs, or dietary supplements you use. Also tell them if you smoke, drink alcohol, or use illegal drugs. Some items may interact with your medicine. What should I watch for while using this medicine? Visit your doctor or health care professional for regular checks on your progress. You will need a regular breast and pelvic exam and Pap smear while on this medicine. Use an additional method of contraception during the first cycle that you take these tablets. If you have any reason to think you are pregnant, stop taking this medicine right away and contact your doctor or health care professional. If you are taking this medicine for hormone related problems, it may take several cycles of use to see improvement in your condition. Smoking increases the risk of getting a blood clot or having a stroke while you are taking birth control pills, especially if you are more than 30 years old. You are strongly advised not to smoke. This medicine can make your body retain fluid, making your fingers, hands, or ankles swell. Your blood pressure can go up. Contact your doctor or health care professional if you feel you are retaining fluid. This medicine can make you more sensitive to the sun. Keep out of the sun. If you cannot avoid being in the sun, wear protective clothing and use sunscreen. Do not use sun lamps or tanning beds/booths. If you wear  contact lenses and notice visual changes, or if the lenses begin to feel uncomfortable, consult your eye care specialist. In some women, tenderness, swelling, or minor bleeding of the gums may occur. Notify your dentist if this happens. Brushing and flossing your teeth regularly may help limit this. See your dentist regularly and inform your dentist of the medicines you are taking. If you are going to have elective surgery, you may need to stop taking this medicine before the surgery. Consult your health care professional for advice. This medicine does not protect you against HIV infection (AIDS) or any other sexually transmitted diseases. What side effects may I notice from receiving this medicine? Side effects that you should report to your doctor or health care professional as soon as possible:  allergic reactions such as skin rash or itching, hives, swelling of the lips, mouth, tongue, or throat  breast tissue changes or discharge  dark patches of skin on your forehead, cheeks, upper lip, and chin  depression  high blood pressure  migraines or severe, sudden headaches  signs and symptoms of a blood clot such as breathing problems; changes in vision; chest pain; severe, sudden headache; pain, swelling, warmth in the leg; trouble speaking; sudden numbness or weakness of the face, arm or leg  stomach pain  symptoms of vaginal infection like itching, irritation or unusual discharge  yellowing of the eyes or skin Side effects that usually do not require medical attention (report these to your doctor or health care professional  if they continue or are bothersome):  acne  breast pain, tenderness  irregular vaginal bleeding or spotting, particularly during the first month of use  mild headache  nausea  weight gain (slight) This list may not describe all possible side effects. Call your doctor for medical advice about side effects. You may report side effects to FDA at  1-800-FDA-1088. Where should I keep my medicine? Keep out of the reach of children. Store at room temperature between 15 and 30 degrees C (59 and 86 degrees F). Throw away any unused medicine after the expiration date. NOTE: This sheet is a summary. It may not cover all possible information. If you have questions about this medicine, talk to your doctor, pharmacist, or health care provider.  2021 Elsevier/Gold Standard (2020-09-03 12:27:45)

## 2021-01-04 NOTE — Progress Notes (Signed)
Alexa Diaz is a 30 y.o. year old Caucasian female here for Nexplanon removal.  Patient given informed consent for removal.  BP 125/82   Pulse (!) 59   Ht 5\' 5"  (1.651 m)   Wt 256 lb (116.1 kg)   BMI 42.60 kg/m   Appropriate time out taken. Implanon site identified.  Area prepped in usual sterile fashon. One cc of 2% lidocaine was used to anesthetize the area at the distal end of the implant. A small stab incision was made right beside the implant on the distal portion.  The Implanon rod was grasped using hemostats and removed without difficulty.  There was less than 3 cc blood loss. There were no complications.  Steri-strips were applied over the small incision and a pressure bandage was applied.  The patient tolerated the procedure well.  She was instructed to keep the area clean and dry, remove pressure bandage in 24 hours, and keep insertion site covered with the steri-strip for 3-5 days.    Samples Lo Loestrin given.   Reviewed red flag symptoms and when to call.   RTC for ANNUAL EXAM and PAP or sooner if needed.    , CNM [facility], [facility]

## 2021-01-04 NOTE — Progress Notes (Signed)
Pt desires removal of Nexplanon.  Thinking about OCP's with lower estrogen

## 2021-01-29 ENCOUNTER — Encounter: Payer: Self-pay | Admitting: Family Medicine

## 2021-01-31 ENCOUNTER — Telehealth (INDEPENDENT_AMBULATORY_CARE_PROVIDER_SITE_OTHER): Payer: BLUE CROSS/BLUE SHIELD | Admitting: Family Medicine

## 2021-01-31 ENCOUNTER — Encounter: Payer: Self-pay | Admitting: Family Medicine

## 2021-01-31 DIAGNOSIS — B001 Herpesviral vesicular dermatitis: Secondary | ICD-10-CM | POA: Insufficient documentation

## 2021-01-31 MED ORDER — VALACYCLOVIR HCL 1 G PO TABS: 1000.0000 mg | ORAL_TABLET | Freq: Two times a day (BID) | ORAL | 5 refills | Status: DC

## 2021-01-31 NOTE — Assessment & Plan Note (Signed)
Will get treated with valtrex. Call if not getting better or getting worse. Continue to monitor.

## 2021-01-31 NOTE — Progress Notes (Signed)
There were no vitals taken for this visit.   Subjective:    Patient ID: Alexa Diaz, female    DOB: July 04, 1991, 30 y.o.   MRN: [id number]  HPI: Alexa Diaz is a 30 y.o. female  Chief Complaint  Patient presents with  . Mouth Lesions    Patient states about 2 days ago she noticed a sore on her lip. Is currently using OTC meds, is getting better but its still there.    SKIN LESION Duration: 2 days now, happening 2-3x a year Location: mouth Painful: yes Itching: no Onset: sudden Context: getting better Associated signs and symptoms:  History of skin cancer: no History of precancerous skin lesions: no Family history of skin cancer: no  Relevant past medical, surgical, family and social history reviewed and updated as indicated. Interim medical history since our last visit reviewed. Allergies and medications reviewed and updated.  Review of Systems  Constitutional: Negative.   Respiratory: Negative.   Cardiovascular: Negative.   Skin:       Cold sore  Neurological: Negative.   Hematological: Negative.   Psychiatric/Behavioral: Negative.     Per HPI unless specifically indicated above     Objective:    There were no vitals taken for this visit.  Wt Readings from Last 3 Encounters:  01/04/21 256 lb (116.1 kg)  05/29/20 285 lb 0.9 oz (129.3 kg)  05/28/20 285 lb 0.9 oz (129.3 kg)    Physical Exam Vitals and nursing note reviewed.  Constitutional:      General: She is not in acute distress.    Appearance: Normal appearance. She is not ill-appearing, toxic-appearing or diaphoretic.  HENT:     Head: Normocephalic and atraumatic.     Right Ear: External ear normal.     Left Ear: External ear normal.     Nose: Nose normal.     Mouth/Throat:     Mouth: Mucous membranes are moist.     Pharynx: Oropharynx is clear.     Comments: Cold sore on her lip Eyes:     General: No scleral icterus.       Right eye: No discharge.        Left eye: No discharge.      Conjunctiva/sclera: Conjunctivae normal.     Pupils: Pupils are equal, round, and reactive to light.  Pulmonary:     Effort: Pulmonary effort is normal. No respiratory distress.     Comments: Speaking in full sentences Musculoskeletal:        General: Normal range of motion.     Cervical back: Normal range of motion.  Skin:    Coloration: Skin is not jaundiced or pale.     Findings: No bruising, erythema, lesion or rash.  Neurological:     Mental Status: She is alert and oriented to person, place, and time. Mental status is at baseline.  Psychiatric:        Mood and Affect: Mood normal.        Behavior: Behavior normal.        Thought Content: Thought content normal.        Judgment: Judgment normal.     Results for orders placed or performed during the hospital encounter of 11/21/19  GC/Chlamydia Probe Amp   Specimen: Vaginal  Result Value Ref Range   Chlamydia trachomatis, NAA Positive (A) Negative   Neisseria Gonorrhoeae by PCR Negative Negative   CT/NG NAA Source ENDOCERVICAL       Assessment & Plan:  Problem List Items Addressed This Visit      Digestive   Cold sore - Primary    Will get treated with valtrex. Call if not getting better or getting worse. Continue to monitor.       Relevant Medications   valACYclovir (VALTREX) 1000 MG tablet       Follow up plan: Return if symptoms worsen or fail to improve.    . This visit was completed via video visit through MyChart due to the restrictions of the COVID-19 pandemic. All issues as above were discussed and addressed. Physical exam was done as above through visual confirmation on video through MyChart. If it was felt that the patient should be evaluated in the office, they were directed there. The patient verbally consented to this visit. . Location of the patient: parking lot . Location of the provider: work . Those involved with this call:  . Provider: Olevia Perches, DO . CMA: Rolley Sims, CMA . Front  Desk/Registration: Harriet Pho  . Time spent on call: 22 minutes with patient face to face via video conference. More than 50% of this time was spent in counseling and coordination of care. 12 minutes total spent in review of patient's record and preparation of their chart.

## 2021-02-07 ENCOUNTER — Other Ambulatory Visit (HOSPITAL_COMMUNITY)
Admission: RE | Admit: 2021-02-07 | Discharge: 2021-02-07 | Disposition: A | Discharge status: Home / Self Care | Payer: BLUE CROSS/BLUE SHIELD | Source: Ambulatory Visit | Attending: Certified Nurse Midwife | Admitting: Certified Nurse Midwife

## 2021-02-07 ENCOUNTER — Encounter: Payer: Self-pay | Admitting: Certified Nurse Midwife

## 2021-02-07 ENCOUNTER — Ambulatory Visit (INDEPENDENT_AMBULATORY_CARE_PROVIDER_SITE_OTHER): Payer: BLUE CROSS/BLUE SHIELD | Admitting: Certified Nurse Midwife

## 2021-02-07 ENCOUNTER — Other Ambulatory Visit: Payer: Self-pay

## 2021-02-07 VITALS — BP 123/87 | HR 72 | Resp 16 | Ht 65.0 in | Wt 250.3 lb

## 2021-02-07 DIAGNOSIS — Z01419 Encounter for gynecological examination (general) (routine) without abnormal findings: Secondary | ICD-10-CM

## 2021-02-07 DIAGNOSIS — Z124 Encounter for screening for malignant neoplasm of cervix: Secondary | ICD-10-CM | POA: Diagnosis not present

## 2021-02-07 DIAGNOSIS — Z3041 Encounter for surveillance of contraceptive pills: Secondary | ICD-10-CM | POA: Diagnosis not present

## 2021-02-07 MED ORDER — LO LOESTRIN FE 1 MG-10 MCG / 10 MCG PO TABS
1.0000 | ORAL_TABLET | Freq: Every day | ORAL | 4 refills | Status: DC
Start: 2021-02-07 — End: 2021-02-25

## 2021-02-07 NOTE — Patient Instructions (Signed)
Norethindrone Acetate; Ethinyl Estradiol; Ferrous Fumarate Capsules or Tablets What is this medicine? NORETHINDRONE; ETHINYL ESTRADIOL; FERROUS FUMARATE (nor eth IN drone; ETH in il es tra DYE ole; FER [place] FUE ma rate) is an oral contraceptive. The products combine two types of female hormones, an estrogen and a progestin. These products prevent ovulation and pregnancy. This medicine may be used for other purposes; ask your health care provider or pharmacist if you have questions. COMMON BRAND NAME(S): Aurovela [address] 1/20, [address] [place] [address], [address] Fe, [place], [place], Gildess 24 Fe, Gildess Fe 1.5/30, [place]ildess Fe 1/20, Hailey 24 Fe, Hailey Fe 1.5/30, Junel Fe 1.5/30, Junel Fe 1/20, Jun[place] Fe 24, [place] Fe, Lo Loestrin Fe, Loestrin 24 Fe, Loestrin FE 1.5/30, Loestrin FE 1/20, [place] 24 Fe, [place], Microgestin 24 Fe, Microgestin Fe 1.5/30, Microgestin Fe 1/20, Tarina 24 Fe, Tarina Fe 1/20, Taysofy, [place], Tilia Fe, Tri-Legest Fe What should I tell my health care provider before I take this medicine? They need to know if you have any of these conditions:  abnormal vaginal bleeding  blood vessel disease or blood clots  breast, cervical, endometrial, ovarian, liver, or uterine cancer  diabetes  gallbladder disease  having surgery  heart disease or recent heart attack  high blood pressure  high cholesterol or triglycerides  history of irregular heartbeat or heart valve problems  kidney disease  liver disease  migraine headaches  protein C deficiency  protein S deficiency  recently had a baby, miscarriage, or abortion  stroke  systemic lupus erythematosus (SLE)  tobacco smoker  an unusual or allergic reaction to estrogens, progestins, other medicines, foods, dyes, or preservatives  pregnant or trying to get pregnant  breast-feeding How should I use this medicine? Take this medicine by mouth. To reduce nausea, this medicine may be taken with food. Follow  the directions on the prescription label. Take this medicine at the same time each day and in the order directed on the package. Do not take your medicine more often than directed. A patient package insert for the product will be given with each prescription and refill. Read this sheet carefully each time. The sheet may change frequently. Contact your pediatrician regarding the use of this medicine in children. Special care may be needed. This medicine has been used in female children who have started having menstrual periods. Overdosage: If you think you have taken too much of this medicine contact a poison control center or emergency room at once. NOTE: This medicine is only for you. Do not share this medicine with others. What if I miss a dose? If you miss a dose, refer to the patient information sheet you received with your medication for direction. If you miss more than one pill, this medication may not be as effective, and you may need to use another form of birth control. What may interact with this medicine? Do not take this medicine with the following medication:  dasabuvir; ombitasvir; paritaprevir; ritonavir  ombitasvir; paritaprevir; ritonavir This medicine may also interact with the following medications:  acetaminophen  antibiotics or medicines for infections, especially rifampin, rifabutin, rifapentine, and griseofulvin, and possibly penicillins or tetracyclines  aprepitant  ascorbic acid (vitamin C)  atorvastatin  barbiturate medicines, such as phenobarbital  bosentan  carbamazepine  caffeine  clofibrate  cyclosporine  dantrolene  doxercalciferol  felbamate  grapefruit juice  hydrocortisone  medicines for anxiety or sleeping problems, such as diazepam or temazepam  medicines for diabetes, including pioglitazone  mineral oil  modafinil  mycophenolate  nefazodone  oxcarbazepine  phenytoin  prednisolone  ritonavir or other medicines for  HIV infection or AIDS  rosuvastatin  selegiline  soy isoflavones supplements  St. John's wort  tamoxifen or raloxifene  theophylline  thyroid hormones  topiramate  warfarin This list may not describe all possible interactions. Give your health care provider a list of all the medicines, herbs, non-prescription drugs, or dietary supplements you use. Also tell them if you smoke, drink alcohol, or use illegal drugs. Some items may interact with your medicine. What should I watch for while using this medicine? Visit your doctor or health care professional for regular checks on your progress. You will need a regular breast and pelvic exam and Pap smear while on this medicine. Use an additional method of contraception during the first cycle that you take these tablets. If you have any reason to think you are pregnant, stop taking this medicine right away and contact your doctor or health care professional. If you are taking this medicine for hormone related problems, it may take several cycles of use to see improvement in your condition. Smoking increases the risk of getting a blood clot or having a stroke while you are taking birth control pills, especially if you are more than 30 years old. You are strongly advised not to smoke. This medicine can make your body retain fluid, making your fingers, hands, or ankles swell. Your blood pressure can go up. Contact your doctor or health care professional if you feel you are retaining fluid. This medicine can make you more sensitive to the sun. Keep out of the sun. If you cannot avoid being in the sun, wear protective clothing and use sunscreen. Do not use sun lamps or tanning beds/booths. If you wear contact lenses and notice visual changes, or if the lenses begin to feel uncomfortable, consult your eye care specialist. In some women, tenderness, swelling, or minor bleeding of the gums may occur. Notify your dentist if this happens. Brushing and  flossing your teeth regularly may help limit this. See your dentist regularly and inform your dentist of the medicines you are taking. If you are going to have elective surgery, you may need to stop taking this medicine before the surgery. Consult your health care professional for advice. This medicine does not protect you against HIV infection (AIDS) or any other sexually transmitted diseases. What side effects may I notice from receiving this medicine? Side effects that you should report to your doctor or health care professional as soon as possible:  allergic reactions such as skin rash or itching, hives, swelling of the lips, mouth, tongue, or throat  breast tissue changes or discharge  dark patches of skin on your forehead, cheeks, upper lip, and chin  depression  high blood pressure  migraines or severe, sudden headaches  signs and symptoms of a blood clot such as breathing problems; changes in vision; chest pain; severe, sudden headache; pain, swelling, warmth in the leg; trouble speaking; sudden numbness or weakness of the face, arm or leg  stomach pain  symptoms of vaginal infection like itching, irritation or unusual discharge  yellowing of the eyes or skin Side effects that usually do not require medical attention (report these to your doctor or health care professional if they continue or are bothersome):  acne  breast pain, tenderness  irregular vaginal bleeding or spotting, particularly during the first month of use  mild headache  nausea  weight gain (slight) This list may not describe all  possible side effects. Call your doctor for medical advice about side effects. You may report side effects to FDA at 1-800-FDA-1088. Where should I keep my medicine? Keep out of the reach of children. Store at room temperature between 15 and 30 degrees C (59 and 86 degrees F). Throw away any unused medicine after the expiration date. NOTE: This sheet is a summary. It may not  cover all possible information. If you have questions about this medicine, talk to your doctor, pharmacist, or health care provider.  2021 Elsevier/Gold Standard (2020-09-03 12:27:45)   Preventive Care 55-70 Years Old, Female Preventive care refers to lifestyle choices and visits with your health care provider that can promote health and wellness. This includes:  A yearly physical exam. This is also called an annual wellness visit.  Regular dental and eye exams.  Immunizations.  Screening for certain conditions.  Healthy lifestyle choices, such as: ? Eating a healthy diet. ? Getting regular exercise. ? Not using drugs or products that contain nicotine and tobacco. ? Limiting alcohol use. What can I expect for my preventive care visit? Physical exam Your health care provider may check your:  Height and weight. These may be used to calculate your BMI (body mass index). BMI is a measurement that tells if you are at a healthy weight.  Heart rate and blood pressure.  Body temperature.  Skin for abnormal spots. Counseling Your health care provider may ask you questions about your:  Past medical problems.  Family's medical history.  Alcohol, tobacco, and drug use.  Emotional well-being.  Home life and relationship well-being.  Sexual activity.  Diet, exercise, and sleep habits.  Work and work Statistician.  Access to firearms.  Method of birth control.  Menstrual cycle.  Pregnancy history. What immunizations do I need? Vaccines are usually given at various ages, according to a schedule. Your health care provider will recommend vaccines for you based on your age, medical history, and lifestyle or other factors, such as travel or where you work.   What tests do I need? Blood tests  Lipid and cholesterol levels. These may be checked every 5 years starting at age 30.  Hepatitis C test.  Hepatitis B test. Screening  Diabetes screening. This is done by checking  your blood sugar (glucose) after you have not eaten for a while (fasting).  STD (sexually transmitted disease) testing, if you are at risk.  BRCA-related cancer screening. This may be done if you have a family history of breast, ovarian, tubal, or peritoneal cancers.  Pelvic exam and Pap test. This may be done every 3 years starting at age 71. Starting at age 67, this may be done every 5 years if you have a Pap test in combination with an HPV test. Talk with your health care provider about your test results, treatment options, and if necessary, the need for more tests.   Follow these instructions at home: Eating and drinking  Eat a healthy diet that includes fresh fruits and vegetables, whole grains, lean protein, and low-fat dairy products.  Take vitamin and mineral supplements as recommended by your health care provider.  Do not drink alcohol if: ? Your health care provider tells you not to drink. ? You are pregnant, may be pregnant, or are planning to become pregnant.  If you drink alcohol: ? Limit how much you have to 0-1 drink a day. ? Be aware of how much alcohol is in your drink. In the [place] one drink equals one 12 oz  bottle of beer (355 mL), one 5 oz glass of wine (148 mL), or one 1 oz glass of hard liquor (44 mL).   Lifestyle  Take daily care of your teeth and gums. Brush your teeth every morning and night with fluoride toothpaste. Floss one time each day.  Stay active. Exercise for at least 30 minutes 5 or more days each week.  Do not use any products that contain nicotine or tobacco, such as cigarettes, e-cigarettes, and chewing tobacco. If you need help quitting, ask your health care provider.  Do not use drugs.  If you are sexually active, practice safe sex. Use a condom or other form of protection to prevent STIs (sexually transmitted infections).  If you do not wish to become pregnant, use a form of birth control. If you plan to become pregnant, see your health care  provider for a prepregnancy visit.  Find healthy ways to cope with stress, such as: ? Meditation, yoga, or listening to music. ? Journaling. ? Talking to a trusted person. ? Spending time with friends and family. Safety  Always wear your seat belt while driving or riding in a vehicle.  Do not drive: ? If you have been drinking alcohol. Do not ride with someone who has been drinking. ? When you are tired or distracted. ? While texting.  Wear a helmet and other protective equipment during sports activities.  If you have firearms in your house, make sure you follow all gun safety procedures.  Seek help if you have been physically or sexually abused. What's next?  Go to your health care provider once a year for an annual wellness visit.  Ask your health care provider how often you should have your eyes and teeth checked.  Stay up to date on all vaccines. This information is not intended to replace advice given to you by your health care provider. Make sure you discuss any questions you have with your health care provider. Document Revised: 06/10/2020 Document Reviewed: 06/24/2018 Elsevier Patient Education  2021 [employer].

## 2021-02-07 NOTE — Progress Notes (Signed)
ANNUAL PREVENTATIVE CARE GYN  ENCOUNTER NOTE  Subjective:       Alexa Diaz is a 30 y.o. G70P2001 female here for a routine annual gynecologic exam.  Current complaints: 1. Wishes to continue Lo Loestrin 2. Needs Pap smear  Denies difficulty breathing or respiratory distress, chest pain, abdominal pain, excessive vaginal bleeding, dysuria, and leg pain or swelling.    Gynecologic History  Patient's last menstrual period was 01/31/2021 (exact date).  Contraception: OCP (estrogen/progesterone), Lo Loestrin  Last Pap: 2018. Results were: normal  Obstetric History OB History  Gravida Para Term Preterm AB Living  2 2 2  0 0 1  SAB IAB Ectopic Multiple Live Births          1    # Outcome Date GA Lbr Len/2nd Weight Sex Delivery Anes PTL Lv  2 Term 05/07/14 [redacted]w[redacted]d  8 lb 1.8 oz (3.679 kg) F Vag-Spont EPI N LIV  1 Term             Past Medical History:  Diagnosis Date  . Allergic rhinitis   . Back pain    3 bulging discs, arthritis and scoliosis 9/15  . Chronic post-traumatic stress disorder (PTSD)   . Herpes simplex virus infection    1 and 2  . 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 NO PAST SURGERIES      Current Outpatient Medications on File Prior to Visit  Medication Sig Dispense Refill  . albuterol (VENTOLIN HFA) 108 (90 Base) MCG/ACT inhaler Inhale 2 puffs into the lungs every 6 (six) hours as needed for wheezing or shortness of breath. 8 g 0   No current facility-administered medications on file prior to visit.    Allergies  Allergen Reactions  . Sertraline Hcl Other (See Comments)    Not effective  . Wellbutrin [Bupropion] Rash    Social History   Socioeconomic History  . Marital status: Divorced    Spouse name: Not on file  . Number of children: Not on file  . Years of education: Not on file  . Highest education level: Not on file  Occupational History  . Not on  file  Tobacco Use  . Smoking status: Former Smoker    Packs/day: 0.33    Types: Cigarettes    Quit date: 04/15/2016    Years since quitting: 4.8  . Smokeless tobacco: Never Used  Vaping Use  . Vaping Use: Every day  Substance and Sexual Activity  . Alcohol use: Yes    Comment: rare  . Drug use: Not Currently  . Sexual activity: Yes    Birth control/protection: Implant  Other Topics Concern  . Not on file  Social History Narrative  . Not on file   Social Determinants of Health   Financial Resource Strain: Not on file  Food Insecurity: Not on file  Transportation Needs: Not on file  Physical Activity: Not on file  Stress: Not on file  Social Connections: Not on file  Intimate Partner Violence: Not on file    Family History  Problem Relation Age of Onset  . Alcohol abuse Father   . Arthritis Father   . Hyperlipidemia Father   . Hypertension Father   . Thyroid disease Brother     The following portions of the patient's history were reviewed and updated as appropriate: allergies, current medications, past family history, past medical history, past social  history, past surgical history and problem list.  Review of Systems  ROS negative except as noted above. Information obtained from patient.    Objective:   BP 123/87   Pulse 72   Resp 16   Ht 5\' 5"  (1.651 m)   Wt 250 lb 4.8 oz (113.5 kg)   LMP 01/31/2021 (Exact Date)   BMI 41.65 kg/m    CONSTITUTIONAL: Well-developed, well-nourished female in no acute distress.   PSYCHIATRIC: Normal mood and affect. Normal behavior. Normal judgment and thought content.  NEUROLGIC: Alert and oriented to person, place, and time. Normal muscle tone coordination. No cranial nerve deficit noted.  HENT:  Normocephalic, atraumatic.  EYES: Conjunctivae and EOM are normal.   NECK: Normal range of motion, supple, no masses.  Normal thyroid.   SKIN: Skin is warm and dry. No rash noted. Not diaphoretic. No erythema. No pallor.  Professional tattoos present.   CARDIOVASCULAR: Normal heart rate noted, regular rhythm, no murmur.  RESPIRATORY: Clear to auscultation bilaterally. Effort  and breath sounds normal, no problems with respiration noted.  BREASTS: Symmetric in size. No masses, skin changes, nipple drainage, or lymphadenopathy.  ABDOMEN: Soft, normal bowel sounds, no distention noted.  No tenderness, rebound or guarding. Obese.   PELVIC:  External Genitalia: Normal  Vagina: Normal  Cervix: Normal, Pap collected  Uterus: Normal  Adnexa: Normal  MUSCULOSKELETAL: Normal range of motion. No tenderness.  No cyanosis, clubbing, or edema.  2+ distal pulses.  LYMPHATIC: No Axillary, Supraclavicular, or Inguinal Adenopathy.  Assessment:   Annual gynecologic examination 30 y.o.   Contraception: OCP (estrogen/progesterone), Lo Loestrin   Obesity 3   Problem List Items Addressed This Visit   None   Visit Diagnoses    Well woman exam    -  Primary   Cervical cancer screening       Relevant Orders   Cytology - PAP   Surveillance for birth control, oral contraceptives          Plan:   Pap: Pap Co Test  Labs: Declined  Routine preventative health maintenance measures emphasized: Exercise/Diet/Weight control, Tobacco Warnings, Alcohol/Substance use risks and Stress Management; see AVS  Rx Lo Loestrin, see orders  Reviewed red flag symptoms and when to call  Return to Clinic - 1 Year for 26 or sooner if needed   [employer] [facility], CNM  [facility], [facility] 02/07/21 4:00 PM

## 2021-02-11 ENCOUNTER — Telehealth: Payer: Self-pay | Admitting: Surgical

## 2021-02-11 NOTE — Telephone Encounter (Signed)
Patient sent a my chart message that her Lo Loestrin was to expensive. Please send something else to pharmacy. I have given her another sample to get her through the month. I have let the patient know that you are out of office this week and will send in when you get back.

## 2021-02-12 LAB — CYTOLOGY - PAP
Adequacy: ABSENT
Comment: NEGATIVE
Diagnosis: NEGATIVE
High risk HPV: NEGATIVE

## 2021-02-18 NOTE — Telephone Encounter (Signed)
Please contact patient and see if she attempted to use the co-pay card. If so, and still too expensive then may have prescription for Sprintec since no other low dose option on the market. Thanks, JML

## 2021-02-25 ENCOUNTER — Other Ambulatory Visit: Payer: Self-pay | Admitting: Certified Nurse Midwife

## 2021-02-25 DIAGNOSIS — Z3041 Encounter for surveillance of contraceptive pills: Secondary | ICD-10-CM

## 2021-02-25 MED ORDER — NORGESTIMATE-ETH ESTRADIOL 0.25-35 MG-MCG PO TABS: 1.0000 | ORAL_TABLET | Freq: Every day | ORAL | 11 refills | Status: DC

## 2021-02-25 NOTE — Progress Notes (Signed)
Rx Sprintec, see orders.    Serafina Royals, CNM [facility], [facility] 02/25/21 11:35 AM

## 2021-06-24 ENCOUNTER — Ambulatory Visit: Payer: Self-pay | Admitting: *Deleted

## 2021-06-24 NOTE — Telephone Encounter (Signed)
Appointment scheduled on 06/27/21

## 2021-06-24 NOTE — Telephone Encounter (Signed)
Patient is calling to report she has increased back pain- hurts when she sits. Pain is lower R just above buttocks. Patient states she has had pain before- but this pain is more intense. Appointment scheduled for evaluation of back.

## 2021-06-24 NOTE — Telephone Encounter (Signed)
Reason for Disposition  [1] MODERATE back pain (e.g., interferes with normal activities) AND [2] present > 3 days  Answer Assessment - Initial Assessment Questions 1. ONSET: "When did the pain begin?"      Early am today 2. LOCATION: "Where does it hurt?" (upper, mid or lower back)     R lower back 3. SEVERITY: "How bad is the pain?"  (e.g., Scale 1-10; mild, moderate, or severe)   - MILD (1-3): doesn't interfere with normal activities    - MODERATE (4-7): interferes with normal activities or awakens from sleep    - SEVERE (8-10): excruciating pain, unable to do any normal activities      moderate 4. PATTERN: "Is the pain constant?" (e.g., yes, no; constant, intermittent)      constant 5. RADIATION: "Does the pain shoot into your legs or elsewhere?"     No- but into buttocks/hips 6. CAUSE:  "What do you think is causing the back pain?"      unsure 7. BACK OVERUSE:  "Any recent lifting of heavy objects, strenuous work or exercise?"     no 8. MEDICATIONS: "What have you taken so far for the pain?" (e.g., nothing, acetaminophen, NSAIDS)     Ibuprofen- doesn't really help 9. NEUROLOGIC SYMPTOMS: "Do you have any weakness, numbness, or problems with bowel/bladder control?"     no 10. OTHER SYMPTOMS: "Do you have any other symptoms?" (e.g., fever, abdominal pain, burning with urination, blood in urine)       no 11. PREGNANCY: "Is there any chance you are pregnant?" (e.g., yes, no; LMP)       No- LMP-11/2 weeks ago  Protocols used: Back Pain-A-AH

## 2021-06-27 ENCOUNTER — Encounter: Payer: Self-pay | Admitting: Family Medicine

## 2021-06-27 ENCOUNTER — Other Ambulatory Visit: Payer: Self-pay

## 2021-06-27 ENCOUNTER — Ambulatory Visit (INDEPENDENT_AMBULATORY_CARE_PROVIDER_SITE_OTHER): Payer: BLUE CROSS/BLUE SHIELD | Admitting: Family Medicine

## 2021-06-27 VITALS — BP 95/68 | HR 71 | Temp 98.6°F | Ht 65.0 in | Wt 248.6 lb

## 2021-06-27 DIAGNOSIS — M5441 Lumbago with sciatica, right side: Secondary | ICD-10-CM | POA: Diagnosis not present

## 2021-06-27 MED ORDER — KETOROLAC TROMETHAMINE 60 MG/2ML IM SOLN
60.0000 mg | Freq: Once | INTRAMUSCULAR | Status: AC
  Administered 2021-06-27: 60 mg via INTRAMUSCULAR

## 2021-06-27 MED ORDER — NAPROXEN 500 MG PO TABS: 500.0000 mg | ORAL_TABLET | Freq: Two times a day (BID) | ORAL | 2 refills | Status: DC

## 2021-06-27 MED ORDER — CYCLOBENZAPRINE HCL 10 MG PO TABS: 10.0000 mg | ORAL_TABLET | Freq: Every day | ORAL | 0 refills | Status: DC

## 2021-06-27 NOTE — Progress Notes (Signed)
BP 95/68   Pulse 71   Temp 98.6 F (37 C) (Oral)   Ht 5\' 5"  (1.651 m)   Wt 248 lb 9.6 oz (112.8 kg)   SpO2 98%   BMI 41.37 kg/m    Subjective:    Patient ID: , female    DOB: 03/12/1991, 30 y.o.   MRN: [id number]  HPI: Alexa Diaz is a 30 y.o. female  Chief Complaint  Patient presents with   Back Pain    Low back pain on right side and going down leg, patient states that her right butt cheek went numb since Monday.   BACK PAIN Duration: 4 days Mechanism of injury: no trauma Location: bilateral and low back Onset: sudden Severity: severe Quality: sharp shooting Frequency: constant Radiation: R leg below the knee Aggravating factors: moving Alleviating factors: rest, ice, heat, laying, NSAIDs, and APAP Status: stable Treatments attempted: rest, ice, heat, APAP, and ibuprofen  Relief with NSAIDs?: mild Nighttime pain:  yes Paresthesias / decreased sensation:  yes Bowel / bladder incontinence:  no Fevers:  no Dysuria / urinary frequency:  no  Relevant past medical, surgical, family and social history reviewed and updated as indicated. Interim medical history since our last visit reviewed. Allergies and medications reviewed and updated.  Review of Systems  Constitutional: Negative.   Respiratory: Negative.    Cardiovascular: Negative.   Gastrointestinal: Negative.   Musculoskeletal:  Positive for back pain and myalgias. Negative for arthralgias, gait problem, joint swelling, neck pain and neck stiffness.  Skin: Negative.   Neurological:  Positive for numbness. Negative for dizziness, tremors, seizures, syncope, facial asymmetry, speech difficulty, weakness, light-headedness and headaches.  Psychiatric/Behavioral: Negative.     Per HPI unless specifically indicated above     Objective:    BP 95/68   Pulse 71   Temp 98.6 F (37 C) (Oral)   Ht 5\' 5"  (1.651 m)   Wt 248 lb 9.6 oz (112.8 kg)   SpO2 98%   BMI 41.37 kg/m   Wt Readings from  Last 3 Encounters:  06/27/21 248 lb 9.6 oz (112.8 kg)  02/07/21 250 lb 4.8 oz (113.5 kg)  01/04/21 256 lb (116.1 kg)    Physical Exam Vitals and nursing note reviewed.  Constitutional:      General: She is not in acute distress.    Appearance: Normal appearance. She is not ill-appearing, toxic-appearing or diaphoretic.  HENT:     Head: Normocephalic and atraumatic.     Right Ear: External ear normal.     Left Ear: External ear normal.     Nose: Nose normal.     Mouth/Throat:     Mouth: Mucous membranes are moist.     Pharynx: Oropharynx is clear.  Eyes:     General: No scleral icterus.       Right eye: No discharge.        Left eye: No discharge.     Extraocular Movements: Extraocular movements intact.     Conjunctiva/sclera: Conjunctivae normal.     Pupils: Pupils are equal, round, and reactive to light.  Cardiovascular:     Rate and Rhythm: Normal rate and regular rhythm.     Pulses: Normal pulses.     Heart sounds: Normal heart sounds. No murmur heard.   No friction rub. No gallop.  Pulmonary:     Effort: Pulmonary effort is normal. No respiratory distress.     Breath sounds: Normal breath sounds. No stridor. No wheezing, rhonchi or rales.  Chest:     Chest wall: No tenderness.  Musculoskeletal:        General: Tenderness present.     Cervical back: Normal range of motion and neck supple.  Skin:    General: Skin is warm and dry.     Capillary Refill: Capillary refill takes less than 2 seconds.     Coloration: Skin is not jaundiced or pale.     Findings: No bruising, erythema, lesion or rash.  Neurological:     General: No focal deficit present.     Mental Status: She is alert and oriented to person, place, and time. Mental status is at baseline.  Psychiatric:        Mood and Affect: Mood normal.        Behavior: Behavior normal.        Thought Content: Thought content normal.        Judgment: Judgment normal.    Results for orders placed or performed in visit  on 02/07/21  Cytology - PAP  Result Value Ref Range   High risk HPV Negative    Adequacy      Satisfactory for evaluation; transformation zone component ABSENT.   Diagnosis      - Negative for intraepithelial lesion or malignancy (NILM)   Comment Normal Reference Range HPV - Negative       Assessment & Plan:   Problem List Items Addressed This Visit       Other   Low back pain - Primary    Will treat with exercises, toradol, naproxen and flexeril. Follow up 2 weeks. Call with any concerns or if not getting better.      Relevant Medications   cyclobenzaprine (FLEXERIL) 10 MG tablet   naproxen (NAPROSYN) 500 MG tablet   ketorolac (TORADOL) injection 60 mg     Follow up plan: Return in about 2 weeks (around 07/11/2021).

## 2021-06-27 NOTE — Assessment & Plan Note (Signed)
Will treat with exercises, toradol, naproxen and flexeril. Follow up 2 weeks. Call with any concerns or if not getting better.

## 2021-07-04 ENCOUNTER — Telehealth: Payer: Self-pay

## 2021-07-04 NOTE — Telephone Encounter (Signed)
Patient called to advise that her back pain had gotten better but is now hurting again. Patient states her back hurts even worse than it did during her previous office visit.    Scheduled patient an appointment to be rechecked by Dr. Laural Benes on Friday, 07/05/21.

## 2021-07-04 NOTE — Telephone Encounter (Signed)
Copied from CRM [id number]. Topic: General - Other >> Jul 04, 2021 11:55 AM Payton Spark N wrote: Reason for CRM: Pt called in stating her back pain has started to get worse and she wanted to speak with her PCP or PCP nurse about what she can do next. Please advise.  Routing to provider to advise on next steps for the patient.

## 2021-07-05 ENCOUNTER — Ambulatory Visit (INDEPENDENT_AMBULATORY_CARE_PROVIDER_SITE_OTHER): Payer: BLUE CROSS/BLUE SHIELD | Admitting: Family Medicine

## 2021-07-05 ENCOUNTER — Other Ambulatory Visit: Payer: Self-pay

## 2021-07-05 ENCOUNTER — Encounter: Payer: Self-pay | Admitting: Family Medicine

## 2021-07-05 VITALS — BP 113/78 | HR 74 | Temp 98.5°F | Wt 246.8 lb

## 2021-07-05 DIAGNOSIS — M5441 Lumbago with sciatica, right side: Secondary | ICD-10-CM

## 2021-07-05 MED ORDER — KETOROLAC TROMETHAMINE 60 MG/2ML IM SOLN
60.0000 mg | Freq: Once | INTRAMUSCULAR | Status: AC
  Administered 2021-07-05: 60 mg via INTRAMUSCULAR

## 2021-07-05 NOTE — Progress Notes (Signed)
BP 113/78   Pulse 74   Temp 98.5 F (36.9 C) (Oral)   Wt 246 lb 12.8 oz (111.9 kg)   SpO2 98%   BMI 41.07 kg/m    Subjective:    Patient ID: Alexa Diaz, female    DOB: 06-10-1991, 30 y.o.   MRN: [id number]  HPI: Alexa Diaz is a 30 y.o. female  Chief Complaint  Patient presents with   Back Pain    Pt states her back pain got better last week, but has started hurting again. Thinks it may be her computer chair at work   BACK PAIN- was doing much better until she went back to work. She had been feeling a lot better, then she started feeling a lot worse yesterday.  Duration: about 10 days Mechanism of injury: no trauma Location: bilateral and low back Onset: sudden Severity: severe Quality: sharp and shooting Frequency: resolved and then came back and has been constant.  Radiation: R leg below the knee Aggravating factors: moving, chair at work Alleviating factors: rest, ice, heat, laying, NSAIDs, APAP, and muscle relaxer Status: fluctuating Treatments attempted: rest, ice, heat, APAP, ibuprofen, aleve, and HEP  Relief with NSAIDs?: mild Nighttime pain:  yes Paresthesias / decreased sensation:  yes Bowel / bladder incontinence:  no Fevers:  no Dysuria / urinary frequency:  no  Relevant past medical, surgical, family and social history reviewed and updated as indicated. Interim medical history since our last visit reviewed. Allergies and medications reviewed and updated.  Review of Systems  Constitutional: Negative.   Respiratory: Negative.    Cardiovascular: Negative.   Gastrointestinal: Negative.   Musculoskeletal:  Positive for back pain and myalgias. Negative for arthralgias, gait problem, joint swelling, neck pain and neck stiffness.  Skin: Negative.   Neurological: Negative.   Psychiatric/Behavioral: Negative.     Per HPI unless specifically indicated above     Objective:    BP 113/78   Pulse 74   Temp 98.5 F (36.9 C) (Oral)   Wt 246 lb 12.8  oz (111.9 kg)   SpO2 98%   BMI 41.07 kg/m   Wt Readings from Last 3 Encounters:  07/05/21 246 lb 12.8 oz (111.9 kg)  06/27/21 248 lb 9.6 oz (112.8 kg)  02/07/21 250 lb 4.8 oz (113.5 kg)    Physical Exam Vitals and nursing note reviewed.  Constitutional:      General: She is not in acute distress.    Appearance: Normal appearance. She is not ill-appearing, toxic-appearing or diaphoretic.  HENT:     Head: Normocephalic and atraumatic.     Right Ear: External ear normal.     Left Ear: External ear normal.     Nose: Nose normal.     Mouth/Throat:     Mouth: Mucous membranes are moist.     Pharynx: Oropharynx is clear.  Eyes:     General: No scleral icterus.       Right eye: No discharge.        Left eye: No discharge.     Extraocular Movements: Extraocular movements intact.     Conjunctiva/sclera: Conjunctivae normal.     Pupils: Pupils are equal, round, and reactive to light.  Cardiovascular:     Rate and Rhythm: Normal rate and regular rhythm.     Pulses: Normal pulses.     Heart sounds: Normal heart sounds. No murmur heard.   No friction rub. No gallop.  Pulmonary:     Effort: Pulmonary effort is normal. No  respiratory distress.     Breath sounds: Normal breath sounds. No stridor. No wheezing, rhonchi or rales.  Chest:     Chest wall: No tenderness.  Musculoskeletal:        General: Normal range of motion.     Cervical back: Normal range of motion and neck supple.  Skin:    General: Skin is warm and dry.     Capillary Refill: Capillary refill takes less than 2 seconds.     Coloration: Skin is not jaundiced or pale.     Findings: No bruising, erythema, lesion or rash.  Neurological:     General: No focal deficit present.     Mental Status: She is alert and oriented to person, place, and time. Mental status is at baseline.  Psychiatric:        Mood and Affect: Mood normal.        Behavior: Behavior normal.        Thought Content: Thought content normal.         Judgment: Judgment normal.    Results for orders placed or performed in visit on 02/07/21  Cytology - PAP  Result Value Ref Range   High risk HPV Negative    Adequacy      Satisfactory for evaluation; transformation zone component ABSENT.   Diagnosis      - Negative for intraepithelial lesion or malignancy (NILM)   Comment Normal Reference Range HPV - Negative       Assessment & Plan:   Problem List Items Addressed This Visit       Other   Low back pain - Primary    Will see chiropractry. Toradol shot today. Note for new chair and increased movement at work. Call if not getting better or getting worse.      Relevant Medications   ketorolac (TORADOL) injection 60 mg     Follow up plan: Return if symptoms worsen or fail to improve.

## 2021-07-05 NOTE — Assessment & Plan Note (Signed)
Will see chiropractry. Toradol shot today. Note for new chair and increased movement at work. Call if not getting better or getting worse.

## 2021-07-22 ENCOUNTER — Ambulatory Visit: Payer: BLUE CROSS/BLUE SHIELD | Admitting: Family Medicine

## 2021-09-02 ENCOUNTER — Ambulatory Visit
Admission: RE | Admit: 2021-09-02 | Discharge: 2021-09-02 | Disposition: A | Discharge status: Home / Self Care | Payer: BLUE CROSS/BLUE SHIELD | Source: Ambulatory Visit | Attending: Physician Assistant | Admitting: Physician Assistant

## 2021-09-02 ENCOUNTER — Encounter: Payer: Self-pay | Admitting: Family Medicine

## 2021-09-02 ENCOUNTER — Other Ambulatory Visit: Payer: Self-pay

## 2021-09-02 VITALS — BP 139/93 | HR 71 | Temp 98.4°F | Resp 16 | Ht 65.0 in | Wt 245.0 lb

## 2021-09-02 DIAGNOSIS — Z20822 Contact with and (suspected) exposure to covid-19: Secondary | ICD-10-CM | POA: Diagnosis not present

## 2021-09-02 DIAGNOSIS — J069 Acute upper respiratory infection, unspecified: Secondary | ICD-10-CM

## 2021-09-02 DIAGNOSIS — H9203 Otalgia, bilateral: Secondary | ICD-10-CM

## 2021-09-02 DIAGNOSIS — J029 Acute pharyngitis, unspecified: Secondary | ICD-10-CM | POA: Diagnosis present

## 2021-09-02 DIAGNOSIS — R0981 Nasal congestion: Secondary | ICD-10-CM

## 2021-09-02 LAB — GROUP A STREP BY PCR: Group A Strep by PCR: NOT DETECTED

## 2021-09-02 MED ORDER — LIDOCAINE VISCOUS HCL 2 % MT SOLN: 15.0000 mL | OROMUCOSAL | 0 refills | Status: DC | PRN

## 2021-09-02 MED ORDER — PREDNISONE 20 MG PO TABS: 40.0000 mg | ORAL_TABLET | Freq: Every day | ORAL | 0 refills | Status: AC

## 2021-09-02 NOTE — ED Provider Notes (Signed)
[facility]    CSN: [id number] Arrival date & time: 09/02/21  1753      History   Chief Complaint Chief Complaint  Patient presents with   Sore Throat   Otalgia    HPI Alexa Diaz is a 30 y.o. female presenting for 2-day history of bilateral ear pain and pressure as well as sore throat and painful swallowing.  She also reports headaches.  Patient believes she might of had a fever at onset because she had chills and sweats but she is unsure.  She has had a mild cough.  Her daughter was sick with RSV last week.  No known flu or COVID exposure.  Patient has not taken any meds for symptoms.  She denies any chest pain or breathing frequency.  No vomiting or diarrhea.  No other complaints.  HPI  Past Medical History:  Diagnosis Date   Allergic rhinitis    Back pain    3 bulging discs, arthritis and scoliosis 9/15   Chronic post-traumatic stress disorder (PTSD)    Herpes simplex virus infection    1 and 2   Reduction defect of left lower extremity    Short L leg- wears lift    Patient Active Problem List   Diagnosis Date Noted   Otalgia, bilateral 09/02/2021   Cold sore 01/31/2021   Class 3 obesity due to excess calories without serious comorbidity with body mass index (BMI) of 40.0 to 44.9 in adult 11/17/2016   Urticaria 07/10/2015   Leg length discrepancy 06/11/2015   Low back pain 06/11/2015   Herpes simplex virus infection    Allergic rhinitis    Chronic post-traumatic stress disorder (PTSD)     Past Surgical History:  Procedure Laterality Date   NO PAST SURGERIES      OB History     Gravida  2   Para  2   Term  2   Preterm  0   AB  0   Living  1      SAB      IAB      Ectopic      Multiple      Live Births  1            Home Medications    Prior to Admission medications   Medication Sig Start Date End Date Taking? Authorizing Provider  albuterol (VENTOLIN HFA) 108 (90 Base) MCG/ACT inhaler Inhale 2 puffs into the  lungs every 6 (six) hours as needed for wheezing or shortness of breath. 09/04/20  Yes Johnson, Megan P, DO  cyclobenzaprine (FLEXERIL) 10 MG tablet Take 1 tablet (10 mg total) by mouth at bedtime. 06/27/21  Yes Johnson, Megan P, DO  lidocaine (XYLOCAINE) 2 % solution Use as directed 15 mLs in the mouth or throat every 3 (three) hours as needed for mouth pain (swish and spit). 09/02/21  Yes Eusebio Friendly B, PA-C  naproxen (NAPROSYN) 500 MG tablet Take 1 tablet (500 mg total) by mouth 2 (two) times daily with a meal. 06/27/21  Yes Johnson, Megan P, DO  norgestimate-ethinyl estradiol (ORTHO-CYCLEN) 0.25-35 MG-MCG tablet Take 1 tablet by mouth daily. 02/25/21  Yes Lawhorn, Vanessa [place], CNM  predniSONE (DELTASONE) 20 MG tablet Take 2 tablets (40 mg total) by mouth daily for 5 days. 09/02/21 09/07/21 Yes Shirlee Latch, PA-C    Family History Family History  Problem Relation Age of Onset   Alcohol abuse Father    Arthritis Father    Hyperlipidemia  Father    Hypertension Father    Thyroid disease Brother     Social History Social History   Tobacco Use   Smoking status: Former    Packs/day: 0.33    Types: Cigarettes    Quit date: 04/15/2016    Years since quitting: 5.3   Smokeless tobacco: Never  Vaping Use   Vaping Use: Every day  Substance Use Topics   Alcohol use: Yes    Comment: rare   Drug use: Not Currently     Allergies   Sertraline hcl and Wellbutrin [bupropion]   Review of Systems Review of Systems  Constitutional:  Positive for fatigue. Negative for chills, diaphoresis and fever.  HENT:  Positive for congestion, rhinorrhea, sinus pressure and sore throat. Negative for ear pain and sinus pain.   Respiratory:  Positive for cough. Negative for shortness of breath.   Gastrointestinal:  Negative for abdominal pain, nausea and vomiting.  Musculoskeletal:  Negative for arthralgias and myalgias.  Skin:  Negative for rash.  Neurological:  Negative for weakness and headaches.   Hematological:  Negative for adenopathy.    Physical Exam Triage Vital Signs ED Triage Vitals  Enc Vitals Group     BP 09/02/21 1824 (!) 139/93     Pulse Rate 09/02/21 1824 71     Resp 09/02/21 1824 16     Temp 09/02/21 1824 98.4 F (36.9 C)     Temp Source 09/02/21 1824 Oral     SpO2 09/02/21 1824 100 %     Weight 09/02/21 1822 245 lb (111.1 kg)     Height 09/02/21 1822 5\' 5"  (1.651 m)     Head Circumference --      Peak Flow --      Pain Score 09/02/21 1821 10     Pain Loc --      Pain Edu? --      Excl. in GC? --    No data found.  Updated Vital Signs BP (!) 139/93 (BP Location: Left Arm)   Pulse 71   Temp 98.4 F (36.9 C) (Oral)   Resp 16   Ht 5\' 5"  (1.651 m)   Wt 245 lb (111.1 kg)   LMP 09/02/2021 (Exact Date)   SpO2 100%   BMI 40.77 kg/m     Physical Exam Vitals and nursing note reviewed.  Constitutional:      General: She is not in acute distress.    Appearance: Normal appearance. She is well-developed. She is not ill-appearing or toxic-appearing.  HENT:     Head: Normocephalic and atraumatic.     Right Ear: Ear canal and external ear normal. A middle ear effusion is present.     Left Ear: Ear canal and external ear normal. A middle ear effusion is present.     Nose: Congestion present.     Mouth/Throat:     Mouth: Mucous membranes are moist.     Pharynx: Oropharynx is clear. Posterior oropharyngeal erythema present.     Tonsils: 1+ on the right. 1+ on the left.  Eyes:     General: No scleral icterus.       Right eye: No discharge.        Left eye: No discharge.     Conjunctiva/sclera: Conjunctivae normal.  Cardiovascular:     Rate and Rhythm: Normal rate and regular rhythm.     Heart sounds: Normal heart sounds.  Pulmonary:     Effort: Pulmonary effort is normal. No respiratory distress.  Breath sounds: Normal breath sounds.  Musculoskeletal:     Cervical back: Neck supple.  Skin:    General: Skin is dry.  Neurological:     General: No  focal deficit present.     Mental Status: She is alert. Mental status is at baseline.     Motor: No weakness.     Gait: Gait normal.  Psychiatric:        Mood and Affect: Mood normal.        Behavior: Behavior normal.        Thought Content: Thought content normal.     UC Treatments / Results  Labs (all labs ordered are listed, but only abnormal results are displayed) Labs Reviewed  GROUP A STREP BY PCR  SARS CORONAVIRUS 2 (TAT 6-24 HRS)    EKG   Radiology No results found.  Procedures Procedures (including critical care time)  Medications Ordered in UC Medications - No data to display  Initial Impression / Assessment and Plan / UC Course  I have reviewed the triage vital signs and the nursing notes.  Pertinent labs & imaging results that were available during my care of the patient were reviewed by me and considered in my medical decision making (see chart for details).  30 year old female presenting for 2-day history of sore throat and bilateral otalgia as well as sinus pressure and headaches.  Patient afebrile and is mildly ill-appearing but nontoxic.  Exam does reveal effusion of bilateral TMs and nasal congestion as well as posterior visual erythema and 1+ bilateral tonsil enlargement.  Chest clear to auscultation heart regular rate and rhythm.  PCR strep negative.  PCR COVID is obtained.  Current CDC guidelines, isolation protocol and ED precautions reviewed if positive.  Suspect viral upper respiratory infection.  Supportive care encouraged with increasing rest and fluids.  I have sent viscous lidocaine and prednisone to pharmacy and advised her to start Sudafed or Mucinex D, Flonase and nasal saline.  Ibuprofen and Tylenol for discomfort.  Reviewed return and ED precautions.   Final Clinical Impressions(s) / UC Diagnoses   Final diagnoses:  Viral upper respiratory tract infection  Sore throat  Otalgia, bilateral  Nasal congestion     Discharge  Instructions      -Strep test is negative. - We have obtained a COVID test and the result will be back tomorrow. If COVID is positive you need to be isolated 5 days and wear mask for 5 days. - Symptoms consistent with viral upper respiratory infection.  I have sent prednisone to the pharmacy to help with inflammation and hopefully that helps your throat.  Have also sent viscous lidocaine to pharmacy. - I would advise you start taking Sudafed or Mucinex D and using Flonase and nasal saline to help with your ear discomfort.  Tylenol or Motrin also for ear pain. - You should feel better within 7 to 10 days. -Follow-up here today.     ED Prescriptions     Medication Sig Dispense Auth. Provider   predniSONE (DELTASONE) 20 MG tablet Take 2 tablets (40 mg total) by mouth daily for 5 days. 10 tablet Eusebio Friendly B, PA-C   lidocaine (XYLOCAINE) 2 % solution Use as directed 15 mLs in the mouth or throat every 3 (three) hours as needed for mouth pain (swish and spit). 100 mL Shirlee Latch, PA-C      PDMP not reviewed this encounter.   Shirlee Latch, PA-C 09/02/21 1928

## 2021-09-02 NOTE — ED Triage Notes (Signed)
Pt c/o ear pain and sore throat x 2 days. Sinus pressure and headache x 2 days. Chills and sweating not sure if she had a fever. Covid test today was negative at home.

## 2021-09-02 NOTE — Discharge Instructions (Signed)
-  Strep test is negative. - We have obtained a COVID test and the result will be back tomorrow. If COVID is positive you need to be isolated 5 days and wear mask for 5 days. - Symptoms consistent with viral upper respiratory infection.  I have sent prednisone to the pharmacy to help with inflammation and hopefully that helps your throat.  Have also sent viscous lidocaine to pharmacy. - I would advise you start taking Sudafed or Mucinex D and using Flonase and nasal saline to help with your ear discomfort.  Tylenol or Motrin also for ear pain. - You should feel better within 7 to 10 days. -Follow-up here today.

## 2021-09-03 LAB — SARS CORONAVIRUS 2 (TAT 6-24 HRS): SARS Coronavirus 2: NEGATIVE

## 2021-09-03 NOTE — Telephone Encounter (Signed)
Pt states she went to urgent care last night and said she no longer needs an appt. I let pt know that if anything changes she can give [place] a call.

## 2021-09-03 NOTE — Telephone Encounter (Signed)
I can see her at 4:20 OK to double book

## 2021-09-13 ENCOUNTER — Encounter: Payer: Self-pay | Admitting: Family Medicine

## 2021-09-13 ENCOUNTER — Ambulatory Visit (INDEPENDENT_AMBULATORY_CARE_PROVIDER_SITE_OTHER): Payer: BLUE CROSS/BLUE SHIELD | Admitting: Family Medicine

## 2021-09-13 ENCOUNTER — Other Ambulatory Visit: Payer: Self-pay

## 2021-09-13 VITALS — BP 113/73 | HR 66 | Temp 98.2°F | Wt 243.6 lb

## 2021-09-13 DIAGNOSIS — N751 Abscess of Bartholin's gland: Secondary | ICD-10-CM

## 2021-09-13 DIAGNOSIS — N898 Other specified noninflammatory disorders of vagina: Secondary | ICD-10-CM | POA: Insufficient documentation

## 2021-09-13 MED ORDER — DOXYCYCLINE HYCLATE 100 MG PO TABS: 100.0000 mg | ORAL_TABLET | Freq: Two times a day (BID) | ORAL | 0 refills | Status: DC

## 2021-09-13 MED ORDER — LIDOCAINE 5 % EX OINT: 1.0000 "application " | TOPICAL_OINTMENT | CUTANEOUS | 0 refills | Status: DC | PRN

## 2021-09-13 NOTE — Progress Notes (Signed)
BP 113/73   Pulse 66   Temp 98.2 F (36.8 C)   Wt 243 lb 9.6 oz (110.5 kg)   LMP 09/02/2021 (Exact Date)   SpO2 98%   BMI 40.54 kg/m    Subjective:    Patient ID: Alexa Diaz, female    DOB: 29-Aug-1991, 30 y.o.   MRN: [id number]  HPI: Alexa Diaz is a 30 y.o. female  Chief Complaint  Patient presents with   Mass    Patient states she noticed today a lump in her vaginal area that is painful.    LUMP Duration: 3-4 days Location: vaginal lip Onset: sudden Painful: yes Discomfort: yes Status:  bigger Trauma: no Redness: yes Bruising: no Recent infection: no Swollen lymph nodes: no Requesting removal: no History of cancer: no Family history of cancer: no History of the same: no  Relevant past medical, surgical, family and social history reviewed and updated as indicated. Interim medical history since our last visit reviewed. Allergies and medications reviewed and updated.  Review of Systems  Constitutional: Negative.   Respiratory: Negative.    Cardiovascular: Negative.   Gastrointestinal: Negative.   Genitourinary:  Positive for vaginal pain. Negative for decreased urine volume, difficulty urinating, dyspareunia, dysuria, enuresis, flank pain, frequency, genital sores, hematuria, menstrual problem, pelvic pain, urgency, vaginal bleeding and vaginal discharge.  Musculoskeletal: Negative.   Psychiatric/Behavioral: Negative.     Per HPI unless specifically indicated above     Objective:    BP 113/73   Pulse 66   Temp 98.2 F (36.8 C)   Wt 243 lb 9.6 oz (110.5 kg)   LMP 09/02/2021 (Exact Date)   SpO2 98%   BMI 40.54 kg/m   Wt Readings from Last 3 Encounters:  09/13/21 243 lb 9.6 oz (110.5 kg)  09/02/21 245 lb (111.1 kg)  07/05/21 246 lb 12.8 oz (111.9 kg)    Physical Exam Vitals and nursing note reviewed.  Constitutional:      General: She is not in acute distress.    Appearance: Normal appearance. She is not ill-appearing, toxic-appearing or  diaphoretic.  HENT:     Head: Normocephalic and atraumatic.     Right Ear: External ear normal.     Left Ear: External ear normal.     Nose: Nose normal.     Mouth/Throat:     Mouth: Mucous membranes are moist.     Pharynx: Oropharynx is clear.  Eyes:     General: No scleral icterus.       Right eye: No discharge.        Left eye: No discharge.     Extraocular Movements: Extraocular movements intact.     Conjunctiva/sclera: Conjunctivae normal.     Pupils: Pupils are equal, round, and reactive to light.  Cardiovascular:     Rate and Rhythm: Normal rate and regular rhythm.     Pulses: Normal pulses.     Heart sounds: Normal heart sounds. No murmur heard.   No friction rub. No gallop.  Pulmonary:     Effort: Pulmonary effort is normal. No respiratory distress.     Breath sounds: Normal breath sounds. No stridor. No wheezing, rhonchi or rales.  Chest:     Chest wall: No tenderness.  Genitourinary:    Labia:        Right: No rash, tenderness, lesion or injury.        Left: No rash, tenderness, lesion or injury.     Musculoskeletal:  General: Normal range of motion.     Cervical back: Normal range of motion and neck supple.  Skin:    General: Skin is warm and dry.     Capillary Refill: Capillary refill takes less than 2 seconds.     Coloration: Skin is not jaundiced or pale.     Findings: No bruising, erythema, lesion or rash.  Neurological:     General: No focal deficit present.     Mental Status: She is alert and oriented to person, place, and time. Mental status is at baseline.  Psychiatric:        Mood and Affect: Mood normal.        Behavior: Behavior normal.        Thought Content: Thought content normal.        Judgment: Judgment normal.    Results for orders placed or performed during the hospital encounter of 09/02/21  Group A Strep by PCR   Specimen: Throat; Sterile Swab  Result Value Ref Range   Group A Strep by PCR NOT DETECTED NOT DETECTED  SARS  CORONAVIRUS 2 (TAT 6-24 HRS) Nasopharyngeal Nasopharyngeal Swab   Specimen: Nasopharyngeal Swab  Result Value Ref Range   SARS Coronavirus 2 NEGATIVE NEGATIVE      Assessment & Plan:   Problem List Items Addressed This Visit   None Visit Diagnoses     Bartholin's gland abscess    -  Primary   Will treat with lidocaine and doxy. If not better on Monday, will I&D. Call with any concerns or if not getting better.        Follow up plan: Return Monday for I&D if not getting better.Marland Kitchen

## 2021-09-16 ENCOUNTER — Encounter: Payer: Self-pay | Admitting: Family Medicine

## 2021-09-16 ENCOUNTER — Other Ambulatory Visit: Payer: Self-pay

## 2021-09-16 ENCOUNTER — Ambulatory Visit (INDEPENDENT_AMBULATORY_CARE_PROVIDER_SITE_OTHER): Payer: BLUE CROSS/BLUE SHIELD | Admitting: Family Medicine

## 2021-09-16 VITALS — BP 125/81 | HR 61 | Temp 97.8°F | Wt 243.0 lb

## 2021-09-16 DIAGNOSIS — N751 Abscess of Bartholin's gland: Secondary | ICD-10-CM | POA: Diagnosis not present

## 2021-09-16 MED ORDER — OXYCODONE-ACETAMINOPHEN 10-325 MG PO TABS: 1.0000 | ORAL_TABLET | Freq: Four times a day (QID) | ORAL | 0 refills | Status: DC | PRN

## 2021-09-16 NOTE — Progress Notes (Signed)
Skin Procedure  Procedure: Informed consent given.  Sterile prep of the area.  Area infiltrated with lidocaine without epinephrine.  Using a surgical blade, abscess opened and copius amount of pus expressed.  Area cauterized.   Diagnosis:   ICD-10-CM   1. Bartholin's gland abscess  N75.1    Will recheck Wednesday. Continue dressing changes. Percocet for pain. Note for work given.       Lesion Location/Size:2 inch abcess in R labia Physician: MJ Consent:  Risks, benefits, and alternative treatments discussed and all questions were answered.  Patient elected to proceed and verbal consent obtained.  Description: Area prepped and draped using semi-sterile technique. Area locally anesthetized using 10 cc's of lidocaine 1% plain. Using a 15 blade scalpel, a  1cm incision was made above the lesion. Abscess cavity entered and copious amount of purulent material expressed.   Adequate hemostastis achieved using Silver Nitrate and pressure. Post Procedure Instructions: Wound care instructions discussed and patient was instructed to keep area clean and dry.  Signs and symptoms of infection discussed, patient agrees to contact the office ASAP should they occur.  Dressing change recommended 2-4x a day.   Follow Up: 2 days for recheck

## 2022-01-12 ENCOUNTER — Encounter: Payer: Self-pay | Admitting: Family Medicine

## 2022-01-13 ENCOUNTER — Encounter: Payer: Self-pay | Admitting: Internal Medicine

## 2022-01-13 ENCOUNTER — Telehealth (INDEPENDENT_AMBULATORY_CARE_PROVIDER_SITE_OTHER): Payer: 59 | Admitting: Internal Medicine

## 2022-01-13 ENCOUNTER — Ambulatory Visit
Admission: RE | Admit: 2022-01-13 | Discharge: 2022-01-13 | Disposition: A | Discharge status: Home / Self Care | Payer: 59 | Attending: Internal Medicine | Admitting: Internal Medicine

## 2022-01-13 ENCOUNTER — Other Ambulatory Visit: Payer: Self-pay

## 2022-01-13 ENCOUNTER — Ambulatory Visit
Admission: RE | Admit: 2022-01-13 | Discharge: 2022-01-13 | Disposition: A | Discharge status: Home / Self Care | Payer: 59 | Source: Ambulatory Visit | Attending: Internal Medicine | Admitting: Internal Medicine

## 2022-01-13 DIAGNOSIS — R0602 Shortness of breath: Secondary | ICD-10-CM | POA: Insufficient documentation

## 2022-01-13 DIAGNOSIS — U071 COVID-19: Secondary | ICD-10-CM | POA: Insufficient documentation

## 2022-01-13 MED ORDER — FEXOFENADINE HCL 180 MG PO TABS: 180.0000 mg | ORAL_TABLET | Freq: Every day | ORAL | 1 refills | Status: DC

## 2022-01-13 MED ORDER — AZITHROMYCIN 250 MG PO TABS: ORAL_TABLET | ORAL | 0 refills | Status: AC

## 2022-01-13 MED ORDER — ALBUTEROL SULFATE HFA 108 (90 BASE) MCG/ACT IN AERS: 2.0000 | INHALATION_SPRAY | Freq: Four times a day (QID) | RESPIRATORY_TRACT | 0 refills | Status: DC | PRN

## 2022-01-13 NOTE — Progress Notes (Addendum)
? ?There were no vitals taken for this visit.  ? ?Subjective:  ? ? Patient ID: Alexa Diaz, female    DOB: 26-Oct-1991, 31 y.o.   MRN: [id number] ? ?Chief Complaint  ?Patient presents with  ? Covid Positive  ?  Patient states she tested positive for COVID. Patient states her fiance tested positive 01/07/22 (Tuesday). Patient states she started to feel symptoms on Thursday. Patient states she started having a scratchy throat and she took an at-home covid test that day and it was negative. Patient states she went to [facility] and took a test and it was negative. Patient states she took another test on Friday and it had a fainted positive line. Patient states took another test yesterday it was positive.  ? Fever  ? Sore Throat  ? Shortness of Breath  ? Chest Pain  ? Sinus Problem  ? ? ?This visit was completed via video visit through MyChart due to the restrictions of the COVID-19 pandemic. All issues as above were discussed and addressed. Physical exam was done as above through visual confirmation on video through MyChart. If it was felt that the patient should be evaluated in the office, they were directed there. The patient verbally consented to this visit. ?Location of the patient: home ?Location of the provider: work ?Those involved with this call:  ?Provider: Charlynne Cousins, MD ?CMA: Frazier Butt, CMA ?Front Desk/Registration: [employer]  ?Time spent on call:  10 minutes with patient face to face via video conference. More than 50% of this time was spent in counseling and coordination of care. 10 minutes total spent in review of patient's record and preparation of their chart. ? ?HPI: ?Alexa Diaz is a 31 y.o. female ? ?Symptoms satretd Thursday took a pcr was -ve then. Home  COVID -ve as well ?Friday was feeling very sick congestion chills and fevers. Took at home covid test and was positive faintly.  ?Third time she has covid.  ?Friday she was worse  ? ?Fever  ?This is a new problem. The current episode  started in the past 7 days. Associated symptoms include congestion. Pertinent negatives include no ear pain.  ?Sore Throat  ?This is a new problem. The current episode started 1 to 4 weeks ago. The problem has been gradually worsening. The maximum temperature recorded prior to her arrival was 100.4 - 100.9 F. The pain is mild. Associated symptoms include congestion and shortness of breath. Pertinent negatives include no ear pain or stridor.  ?Shortness of Breath ?This is a new problem. Pertinent negatives include no ear pain.  ?Sinus Problem ?This is a new problem. Associated symptoms include congestion and shortness of breath. Pertinent negatives include no ear pain.  ? ?Chief Complaint  ?Patient presents with  ? Covid Positive  ?  Patient states she tested positive for COVID. Patient states her fiance tested positive 01/07/22 (Tuesday). Patient states she started to feel symptoms on Thursday. Patient states she started having a scratchy throat and she took an at-home covid test that day and it was negative. Patient states she went to [facility] and took [facility] test and it was negative. Patient states she took another test on Friday and it had a fainted positive line. Patient states took another test yesterday it was positive.  ? Fever  ? Sore Throat  ? Shortness of Breath  ? Chest Pain  ? Sinus Problem  ? ? ?Relevant past medical, surgical, family and social history reviewed and updated as indicated. Interim medical  history since our last visit reviewed. ?Allergies and medications reviewed and updated. ? ?Review of Systems  ?HENT:  Positive for congestion. Negative for ear pain.   ?Respiratory:  Positive for shortness of breath. Negative for stridor.   ? ?Per HPI unless specifically indicated above ? ?   ?Objective:  ?  ?There were no vitals taken for this visit.  ?Wt Readings from Last 3 Encounters:  ?09/16/21 243 lb (110.2 kg)  ?09/13/21 243 lb 9.6 oz (110.5 kg)  ?09/02/21 245 lb (111.1 kg)  ?  ?Physical  Exam ? ?Unable to peform sec to virtual visit.  ? ?Results for orders placed or performed during the hospital encounter of 09/02/21  ?Group A Strep by PCR  ? Specimen: Throat; Sterile Swab  ?Result Value Ref Range  ? Group A Strep by PCR NOT DETECTED NOT DETECTED  ?SARS CORONAVIRUS 2 (TAT 6-24 HRS) Nasopharyngeal Nasopharyngeal Swab  ? Specimen: Nasopharyngeal Swab  ?Result Value Ref Range  ? SARS Coronavirus 2 NEGATIVE NEGATIVE  ? ?   ? ? ?Current Outpatient Medications:  ?  azithromycin (ZITHROMAX) 250 MG tablet, Take 2 tablets on day 1, then 1 tablet daily on days 2 through 5, Disp: 6 tablet, Rfl: 0 ?  cyclobenzaprine (FLEXERIL) 10 MG tablet, Take 1 tablet (10 mg total) by mouth at bedtime., Disp: 30 tablet, Rfl: 0 ?  fexofenadine (ALLEGRA ALLERGY) 180 MG tablet, Take 1 tablet (180 mg total) by mouth daily., Disp: 10 tablet, Rfl: 1 ?  norgestimate-ethinyl estradiol (ORTHO-CYCLEN) 0.25-35 MG-MCG tablet, Take 1 tablet by mouth daily., Disp: 28 tablet, Rfl: 11 ?  albuterol (VENTOLIN HFA) 108 (90 Base) MCG/ACT inhaler, Inhale 2 puffs into the lungs every 6 (six) hours as needed for wheezing or shortness of breath. (Patient not taking: Reported on 01/13/2022), Disp: 8 g, Rfl: 0 ?  doxycycline (VIBRA-TABS) 100 MG tablet, Take 1 tablet (100 mg total) by mouth 2 (two) times daily. (Patient not taking: Reported on 01/13/2022), Disp: 20 tablet, Rfl: 0 ?  lidocaine (XYLOCAINE) 2 % solution, Use as directed 15 mLs in the mouth or throat every 3 (three) hours as needed for mouth pain (swish and spit). (Patient not taking: Reported on 09/13/2021), Disp: 100 mL, Rfl: 0 ?  lidocaine (XYLOCAINE) 5 % ointment, Apply 1 application topically as needed. (Patient not taking: Reported on 01/13/2022), Disp: 35.44 g, Rfl: 0 ?  naproxen (NAPROSYN) 500 MG tablet, Take 1 tablet (500 mg total) by mouth 2 (two) times daily with a meal. (Patient not taking: Reported on 01/13/2022), Disp: 60 tablet, Rfl: 2 ?  oxyCODONE-acetaminophen (PERCOCET)  10-325 MG tablet, Take 1 tablet by mouth every 6 (six) hours as needed for pain. (Patient not taking: Reported on 01/13/2022), Disp: 20 tablet, Rfl: 0  ? ? ?Assessment & Plan:  ?SOB :  check CXR. COVID x 3.  ?Advised to go to the ER if she develops worsening chest pain , shortness of breath dizziness or tingling or numbness.  Pt verbalized understanding of such. ?Get a pulse oximetry and call with readings. ?Start pt on zpak/ allegra ?COVID : positive :  ?Increase fluid intake.  ?Headahce - tyelnol every 4-6 hrs prn and alternate this with ibubrufen 800 mg q 8 hrly. ?Sinus pressure: use steam inhalation.  ?OTC -  Allegra / claritin.  ?Pt verbalized understanding of such, to get to the office at today and get a curb side test for the above. ? ? ?Problem List Items Addressed This Visit   ? ?  ?  Other  ? SOB (shortness of breath) - Primary  ? Relevant Orders  ? DG Chest 2 View (Completed)  ? COVID-19  ? Relevant Medications  ? azithromycin (ZITHROMAX) 250 MG tablet  ?  ? ?Orders Placed This Encounter  ?Procedures  ? DG Chest 2 View  ?  ? ?Meds ordered this encounter  ?Medications  ? azithromycin (ZITHROMAX) 250 MG tablet  ?  Sig: Take 2 tablets on day 1, then 1 tablet daily on days 2 through 5  ?  Dispense:  6 tablet  ?  Refill:  0  ? fexofenadine (ALLEGRA ALLERGY) 180 MG tablet  ?  Sig: Take 1 tablet (180 mg total) by mouth daily.  ?  Dispense:  10 tablet  ?  Refill:  1  ?  ? ?Follow up plan: ?No follow-ups on file. ? ? ? ?

## 2022-01-13 NOTE — Progress Notes (Signed)
Please let pt know this was normal.

## 2022-02-10 ENCOUNTER — Encounter: Payer: BLUE CROSS/BLUE SHIELD | Admitting: Certified Nurse Midwife

## 2022-02-18 ENCOUNTER — Encounter: Payer: Self-pay | Admitting: Family Medicine

## 2022-02-18 ENCOUNTER — Ambulatory Visit: Payer: 59 | Admitting: Family Medicine

## 2022-02-18 VITALS — BP 116/80 | HR 78 | Temp 98.0°F | Wt 257.6 lb

## 2022-02-18 DIAGNOSIS — F419 Anxiety disorder, unspecified: Secondary | ICD-10-CM | POA: Insufficient documentation

## 2022-02-18 MED ORDER — CITALOPRAM HYDROBROMIDE 20 MG PO TABS: 20.0000 mg | ORAL_TABLET | Freq: Every day | ORAL | 3 refills | Status: DC

## 2022-02-18 MED ORDER — CLONAZEPAM 0.5 MG PO TABS: 0.5000 mg | ORAL_TABLET | Freq: Two times a day (BID) | ORAL | 1 refills | Status: DC | PRN

## 2022-02-18 NOTE — Progress Notes (Signed)
? ?BP 116/80   Pulse 78   Temp 98 ?F (36.7 ?C)   Wt 257 lb 9.6 oz (116.8 kg)   SpO2 100%   BMI 42.87 kg/m?   ? ?Subjective:  ? ? Patient ID: Alexa Diaz, female    DOB: 05-16-1991, 31 y.o.   MRN: [id number] ? ?HPI: ?Alexa Diaz is a 31 y.o. female ? ?Chief Complaint  ?Patient presents with  ? Anxiety  ?  Patient states she has been dealing with anxiety for about 2 years but it is getting worse. Patient states she is having panic attacks more frequent. Patient states she feels like her mind never stops and doesn't have motivation to do anything.   ? ?ANXIETY/STRESS ?Duration: about 2 years ?Status:exacerbated ?Anxious mood: yes  ?Excessive worrying: yes ?Irritability: yes  ?Sweating: no ?Nausea: no ?Palpitations:no ?Hyperventilation: no ?Panic attacks: no ?Agoraphobia: no  ?Obscessions/compulsions: no ?Depressed mood: yes ? ?  02/18/2022  ?  4:27 PM 09/13/2021  ?  4:10 PM 06/27/2021  ?  2:01 PM 02/07/2021  ?  3:19 PM 11/17/2016  ?  4:47 PM  ?Depression screen PHQ 2/9  ?Decreased Interest 3 0 0 0 1  ?Down, Depressed, Hopeless 1 0 0 0 1  ?PHQ - 2 Score 4 0 0 0 2  ?Altered sleeping 0 0     ?Tired, decreased energy 3 1     ?Change in appetite 1 0     ?Feeling bad or failure about yourself  1 0     ?Trouble concentrating 3 0     ?Moving slowly or fidgety/restless 2 0     ?Suicidal thoughts 0 0     ?PHQ-9 Score 14 1     ? ? ?  02/18/2022  ?  4:28 PM 09/13/2021  ?  4:10 PM 10/01/2015  ?  2:20 PM  ?GAD 7 : Generalized Anxiety Score  ?Nervous, Anxious, on Edge 3 2 2   ?Control/stop worrying 3 2 1   ?Worry too much - different things 3 2 2   ?Trouble relaxing 3 1 1   ?Restless 1 0 0  ?Easily annoyed or irritable 3 2 3   ?Afraid - awful might happen 3 0 2  ?Total GAD 7 Score 19 9 11   ?Anxiety Difficulty Very difficult Somewhat difficult Somewhat difficult  ? ?Anhedonia: no ?Weight changes: no ?Insomnia: no   ?Hypersomnia: yes ?Fatigue/loss of energy: yes ?Feelings of worthlessness: yes ?Feelings of guilt: yes ?Impaired  concentration/indecisiveness: yes ?Suicidal ideations: no  ?Crying spells: yes ?Recent Stressors/Life Changes: yes ?  Relationship problems: no ?  Family stress: yes   ?  Financial stress: no  ?  Job stress: no  ?  Recent death/loss: no ? ?Relevant past medical, surgical, family and social history reviewed and updated as indicated. Interim medical history since our last visit reviewed. ?Allergies and medications reviewed and updated. ? ?Review of Systems  ?Constitutional: Negative.   ?Respiratory: Negative.    ?Cardiovascular: Negative.   ?Gastrointestinal: Negative.   ?Musculoskeletal: Negative.   ?Neurological: Negative.   ?Psychiatric/Behavioral:  Positive for dysphoric mood. Negative for agitation, behavioral problems, confusion, decreased concentration, hallucinations, self-injury, sleep disturbance and suicidal ideas. The patient is nervous/anxious. The patient is not hyperactive.   ? ?Per HPI unless specifically indicated above ? ?   ?Objective:  ?  ?BP 116/80   Pulse 78   Temp 98 ?F (36.7 ?C)   Wt 257 lb 9.6 oz (116.8 kg)   SpO2 100%   BMI 42.87 kg/m?   ?  Wt Readings from Last 3 Encounters:  ?02/18/22 257 lb 9.6 oz (116.8 kg)  ?09/16/21 243 lb (110.2 kg)  ?09/13/21 243 lb 9.6 oz (110.5 kg)  ?  ?Physical Exam ?Vitals and nursing note reviewed.  ?Constitutional:   ?   General: She is not in acute distress. ?   Appearance: Normal appearance. She is not ill-appearing, toxic-appearing or diaphoretic.  ?HENT:  ?   Head: Normocephalic and atraumatic.  ?   Right Ear: External ear normal.  ?   Left Ear: External ear normal.  ?   Nose: Nose normal.  ?   Mouth/Throat:  ?   Mouth: Mucous membranes are moist.  ?   Pharynx: Oropharynx is clear.  ?Eyes:  ?   General: No scleral icterus.    ?   Right eye: No discharge.     ?   Left eye: No discharge.  ?   Extraocular Movements: Extraocular movements intact.  ?   Conjunctiva/sclera: Conjunctivae normal.  ?   Pupils: Pupils are equal, round, and reactive to light.   ?Cardiovascular:  ?   Rate and Rhythm: Normal rate and regular rhythm.  ?   Pulses: Normal pulses.  ?   Heart sounds: Normal heart sounds. No murmur heard. ?  No friction rub. No gallop.  ?Pulmonary:  ?   Effort: Pulmonary effort is normal. No respiratory distress.  ?   Breath sounds: Normal breath sounds. No stridor. No wheezing, rhonchi or rales.  ?Chest:  ?   Chest wall: No tenderness.  ?Musculoskeletal:     ?   General: Normal range of motion.  ?   Cervical back: Normal range of motion and neck supple.  ?Skin: ?   General: Skin is warm and dry.  ?   Capillary Refill: Capillary refill takes less than 2 seconds.  ?   Coloration: Skin is not jaundiced or pale.  ?   Findings: No bruising, erythema, lesion or rash.  ?Neurological:  ?   General: No focal deficit present.  ?   Mental Status: She is alert and oriented to person, place, and time. Mental status is at baseline.  ?Psychiatric:     ?   Mood and Affect: Mood normal.     ?   Behavior: Behavior normal.     ?   Thought Content: Thought content normal.     ?   Judgment: Judgment normal.  ? ? ?Results for orders placed or performed during the hospital encounter of 09/02/21  ?Group A Strep by PCR  ? Specimen: Throat; Sterile Swab  ?Result Value Ref Range  ? Group A Strep by PCR NOT DETECTED NOT DETECTED  ?SARS CORONAVIRUS 2 (TAT 6-24 HRS) Nasopharyngeal Nasopharyngeal Swab  ? Specimen: Nasopharyngeal Swab  ?Result Value Ref Range  ? SARS Coronavirus 2 NEGATIVE NEGATIVE  ? ?   ?Assessment & Plan:  ? ?Problem List Items Addressed This Visit   ? ?  ? Other  ? Anxiety - Primary  ?  In significant exacerbation. Will start celexa and PRN klonopin. Recheck 1 month. Call with any concerns. Continue to monitor.  ? ?  ?  ? Relevant Medications  ? citalopram (CELEXA) 20 MG tablet  ?  ? ?Follow up plan: ?Return 1 month virtual. ? ? ? ? ? ?

## 2022-02-18 NOTE — Assessment & Plan Note (Signed)
In significant exacerbation. Will start celexa and PRN klonopin. Recheck 1 month. Call with any concerns. Continue to monitor.  ?

## 2022-02-21 ENCOUNTER — Encounter: Payer: Self-pay | Admitting: Family Medicine

## 2022-02-21 ENCOUNTER — Other Ambulatory Visit: Payer: Self-pay | Admitting: Family Medicine

## 2022-02-21 MED ORDER — ONDANSETRON HCL 4 MG PO TABS: 4.0000 mg | ORAL_TABLET | Freq: Three times a day (TID) | ORAL | 1 refills | Status: DC | PRN

## 2022-02-21 NOTE — Progress Notes (Unsigned)
zofran

## 2022-03-20 ENCOUNTER — Telehealth: Payer: 59 | Admitting: Family Medicine

## 2022-03-25 ENCOUNTER — Encounter: Payer: Self-pay | Admitting: Family Medicine

## 2022-03-25 ENCOUNTER — Other Ambulatory Visit: Payer: Self-pay | Admitting: Family Medicine

## 2022-03-25 NOTE — Telephone Encounter (Unsigned)
Copied from CRM [id number]. Topic: General - Other >> Mar 25, 2022  4:16 PM Gaetana Michaelis A wrote: Reason for CRM: Medication Refill - Medication: norgestimate-ethinyl estradiol (ORTHO-CYCLEN) 0.25-35 MG-MCG tablet [[id number]]   Has the patient contacted their pharmacy? No. The patient was uncertain of the medication's status  (Agent: If no, request that the patient contact the pharmacy for the refill. If patient does not wish to contact the pharmacy document the reason why and proceed with request.) (Agent: If yes, when and what did the pharmacy advise?)  Preferred Pharmacy (with phone number or street name): [facility] - [place], [place] - [address] [place] [address] Phone: [phone] Fax: [phone] Hours: Not open 24 hours  Has the patient been seen for an appointment in the last year OR does the patient have an upcoming appointment? Yes.    Agent: Please be advised that RX refills may take up to 3 business days. We ask that you follow-up with your pharmacy.

## 2022-03-26 MED ORDER — NORGESTIMATE-ETH ESTRADIOL 0.25-35 MG-MCG PO TABS: 1.0000 | ORAL_TABLET | Freq: Every day | ORAL | 11 refills | Status: DC

## 2022-03-26 NOTE — Telephone Encounter (Signed)
Requested Prescriptions  Pending Prescriptions Disp Refills  . norgestimate-ethinyl estradiol (ORTHO-CYCLEN) 0.25-35 MG-MCG tablet 28 tablet 11    Sig: Take 1 tablet by mouth daily.     OB/GYN:  Contraceptives Passed - 03/25/2022  4:37 PM      Passed - Last BP in normal range    BP Readings from Last 1 Encounters:  02/18/22 116/80         Passed - Valid encounter within last 12 months    Recent Outpatient Visits          1 month ago Anxiety   [facility] [place], Megan P, DO   2 months ago SOB (shortness of breath)   [facility] Vigg, Avanti, MD   6 months ago Bartholin's gland abscess   [facility] [place], Juliette, DO   6 months ago Bartholin's gland abscess   [facility] [place], Megan P, DO   8 months ago Acute bilateral low back pain with right-sided sciatica   [facility] Dorcas Carrow, DO      Future Appointments            In 2 weeks Laural Benes, Oralia Rud, DO [facility]           Passed - Patient is not a smoker

## 2022-03-27 NOTE — Telephone Encounter (Signed)
Rx has been sent in- can we make sure the pharmacy got it?

## 2022-04-06 ENCOUNTER — Encounter: Payer: Self-pay | Admitting: Family Medicine

## 2022-04-07 MED ORDER — VALACYCLOVIR HCL 1 G PO TABS: 1000.0000 mg | ORAL_TABLET | Freq: Two times a day (BID) | ORAL | 0 refills | Status: DC

## 2022-04-10 ENCOUNTER — Encounter: Payer: Self-pay | Admitting: Family Medicine

## 2022-04-10 ENCOUNTER — Ambulatory Visit (INDEPENDENT_AMBULATORY_CARE_PROVIDER_SITE_OTHER): Payer: 59 | Admitting: Family Medicine

## 2022-04-10 VITALS — BP 109/76 | HR 67 | Wt 257.0 lb

## 2022-04-10 DIAGNOSIS — F419 Anxiety disorder, unspecified: Secondary | ICD-10-CM | POA: Diagnosis not present

## 2022-04-10 DIAGNOSIS — Z136 Encounter for screening for cardiovascular disorders: Secondary | ICD-10-CM

## 2022-04-10 DIAGNOSIS — Z0000 Encounter for general adult medical examination without abnormal findings: Secondary | ICD-10-CM | POA: Diagnosis not present

## 2022-04-10 DIAGNOSIS — Z809 Family history of malignant neoplasm, unspecified: Secondary | ICD-10-CM

## 2022-04-10 LAB — URINALYSIS, ROUTINE W REFLEX MICROSCOPIC
Bilirubin, UA: NEGATIVE
Glucose, UA: NEGATIVE
Ketones, UA: NEGATIVE
Leukocytes,UA: NEGATIVE
Nitrite, UA: NEGATIVE
Protein,UA: NEGATIVE
RBC, UA: NEGATIVE
Specific Gravity, UA: 1.025 (ref 1.005–1.030)
Urobilinogen, Ur: 0.2 mg/dL (ref 0.2–1.0)
pH, UA: 5.5 (ref 5.0–7.5)

## 2022-04-10 MED ORDER — CITALOPRAM HYDROBROMIDE 40 MG PO TABS: 40.0000 mg | ORAL_TABLET | Freq: Every day | ORAL | 2 refills | Status: DC

## 2022-04-10 MED ORDER — CLONAZEPAM 0.5 MG PO TABS: 0.5000 mg | ORAL_TABLET | Freq: Two times a day (BID) | ORAL | 1 refills | Status: DC | PRN

## 2022-04-10 MED ORDER — VALACYCLOVIR HCL 1 G PO TABS: 1000.0000 mg | ORAL_TABLET | Freq: Two times a day (BID) | ORAL | 6 refills | Status: DC

## 2022-04-10 NOTE — Progress Notes (Signed)
BP 109/76   Pulse 67   Wt 257 lb (116.6 kg)   SpO2 100%   BMI 42.77 kg/m    Subjective:    Patient ID: Alexa Diaz, female    DOB: 01/11/1991, 31 y.o.   MRN: [id number]  HPI: Alexa Diaz is a 31 y.o. female presenting on 04/10/2022 for comprehensive medical examination. Current medical complaints include:  ANXIETY/STRESS- needing her klonopin 2-3x a week Duration: chronic Status:better Anxious mood: yes  Excessive worrying: yes Irritability: yes  Sweating: no Nausea: yes Palpitations:yes Hyperventilation: no Panic attacks: yes Agoraphobia: no  Obscessions/compulsions: no Depressed mood: yes    04/10/2022    4:25 PM 02/18/2022    4:27 PM 09/13/2021    4:10 PM 06/27/2021    2:01 PM 02/07/2021    3:19 PM  Depression screen PHQ 2/9  Decreased Interest 1 3 0 0 0  Down, Depressed, Hopeless 1 1 0 0 0  PHQ - 2 Score 2 4 0 0 0  Altered sleeping 1 0 0    Tired, decreased energy 2 3 1     Change in appetite 0 1 0    Feeling bad or failure about yourself  1 1 0    Trouble concentrating 0 3 0    Moving slowly or fidgety/restless 0 2 0    Suicidal thoughts 0 0 0    PHQ-9 Score 6 14 1     Difficult doing work/chores Somewhat difficult          04/10/2022    4:26 PM 02/18/2022    4:28 PM 09/13/2021    4:10 PM 10/01/2015    2:20 PM  GAD 7 : Generalized Anxiety Score  Nervous, Anxious, on Edge 2 3 2 2   Control/stop worrying 1 3 2 1   Worry too much - different things 2 3 2 2   Trouble relaxing 1 3 1 1   Restless 1 1 0 0  Easily annoyed or irritable 1 3 2 3   Afraid - awful might happen 0 3 0 2  Total GAD 7 Score 8 19 9 11   Anxiety Difficulty Not difficult at all Very difficult Somewhat difficult Somewhat difficult    Anhedonia: no Weight changes: no Insomnia: no   Hypersomnia: no Fatigue/loss of energy: yes Feelings of worthlessness: yes Feelings of guilt: yes Impaired concentration/indecisiveness: yes Suicidal ideations: no  Crying spells: no Recent  Stressors/Life Changes: yes   Relationship problems: no   Family stress: yes    Menopausal Symptoms: no  Depression Screen done today and results listed below:     04/10/2022    4:25 PM 02/18/2022    4:27 PM 09/13/2021    4:10 PM 06/27/2021    2:01 PM 02/07/2021    3:19 PM  Depression screen PHQ 2/9  Decreased Interest 1 3 0 0 0  Down, Depressed, Hopeless 1 1 0 0 0  PHQ - 2 Score 2 4 0 0 0  Altered sleeping 1 0 0    Tired, decreased energy 2 3 1     Change in appetite 0 1 0    Feeling bad or failure about yourself  1 1 0    Trouble concentrating 0 3 0    Moving slowly or fidgety/restless 0 2 0    Suicidal thoughts 0 0 0    PHQ-9 Score 6 14 1     Difficult doing work/chores Somewhat difficult        Past Medical History:  Past Medical History:  Diagnosis Date  Allergic rhinitis    Back pain    3 bulging discs, arthritis and scoliosis 9/15   Chronic post-traumatic stress disorder (PTSD)    Herpes simplex virus infection    1 and 2   Reduction defect of left lower extremity    Short L leg- wears lift    Surgical History:  Past Surgical History:  Procedure Laterality Date   NO PAST SURGERIES      Medications:  Current Outpatient Medications on File Prior to Visit  Medication Sig   albuterol (VENTOLIN HFA) 108 (90 Base) MCG/ACT inhaler Inhale 2 puffs into the lungs every 6 (six) hours as needed for wheezing or shortness of breath.   cyclobenzaprine (FLEXERIL) 10 MG tablet Take 1 tablet (10 mg total) by mouth at bedtime.   fexofenadine (ALLEGRA ALLERGY) 180 MG tablet Take 1 tablet (180 mg total) by mouth daily.   naproxen (NAPROSYN) 500 MG tablet Take 1 tablet (500 mg total) by mouth 2 (two) times daily with a meal.   norgestimate-ethinyl estradiol (ORTHO-CYCLEN) 0.25-35 MG-MCG tablet Take 1 tablet by mouth daily.   ondansetron (ZOFRAN) 4 MG tablet Take 1 tablet (4 mg total) by mouth every 8 (eight) hours as needed for nausea or vomiting.   No current  facility-administered medications on file prior to visit.    Allergies:  Allergies  Allergen Reactions   Sertraline Hcl Other (See Comments)    Not effective   Wellbutrin [Bupropion] Rash    Social History:  Social History   Socioeconomic History   Marital status: Divorced    Spouse name: Not on file   Number of children: Not on file   Years of education: Not on file   Highest education level: Not on file  Occupational History   Not on file  Tobacco Use   Smoking status: Former    Packs/day: 0.33    Types: Cigarettes    Quit date: 04/15/2016    Years since quitting: 5.9   Smokeless tobacco: Never  Vaping Use   Vaping Use: Every day  Substance and Sexual Activity   Alcohol use: Yes    Comment: rare   Drug use: Not Currently   Sexual activity: Yes    Birth control/protection: Pill  Other Topics Concern   Not on file  Social History Narrative   Not on file   Social Determinants of Health   Financial Resource Strain: Not on file  Food Insecurity: Not on file  Transportation Needs: Not on file  Physical Activity: Not on file  Stress: Not on file  Social Connections: Not on file  Intimate Partner Violence: Not on file   Social History   Tobacco Use  Smoking Status Former   Packs/day: 0.33   Types: Cigarettes   Quit date: 04/15/2016   Years since quitting: 5.9  Smokeless Tobacco Never   Social History   Substance and Sexual Activity  Alcohol Use Yes   Comment: rare    Family History:  Family History  Problem Relation Age of Onset   Cancer Father    Alcohol abuse Father    Arthritis Father    Hyperlipidemia Father    Hypertension Father    Thyroid disease Brother    Cancer Maternal Grandmother     Past medical history, surgical history, medications, allergies, family history and social history reviewed with patient today and changes made to appropriate areas of the chart.   Review of Systems  Constitutional: Negative.   HENT: Negative.  Eyes: Negative.   Respiratory: Negative.    Cardiovascular: Negative.   Gastrointestinal:  Positive for blood in stool (rarely, couple of days in a row). Negative for abdominal pain, constipation, diarrhea, heartburn, melena, nausea and vomiting.  Genitourinary: Negative.   Musculoskeletal: Negative.   Skin: Negative.   Neurological: Negative.   Endo/Heme/Allergies:  Negative for environmental allergies and polydipsia. Bruises/bleeds easily.  Psychiatric/Behavioral:  Positive for depression. Negative for hallucinations, memory loss, substance abuse and suicidal ideas. The patient is nervous/anxious. The patient does not have insomnia.    All other ROS negative except what is listed above and in the HPI.      Objective:    BP 109/76   Pulse 67   Wt 257 lb (116.6 kg)   SpO2 100%   BMI 42.77 kg/m   Wt Readings from Last 3 Encounters:  04/10/22 257 lb (116.6 kg)  02/18/22 257 lb 9.6 oz (116.8 kg)  09/16/21 243 lb (110.2 kg)    Physical Exam Vitals and nursing note reviewed.  Constitutional:      General: She is not in acute distress.    Appearance: Normal appearance. She is obese. She is not ill-appearing, toxic-appearing or diaphoretic.  HENT:     Head: Normocephalic and atraumatic.     Right Ear: Tympanic membrane, ear canal and external ear normal. There is no impacted cerumen.     Left Ear: Tympanic membrane, ear canal and external ear normal. There is no impacted cerumen.     Nose: Nose normal. No congestion or rhinorrhea.     Mouth/Throat:     Mouth: Mucous membranes are moist.     Pharynx: Oropharynx is clear. No oropharyngeal exudate or posterior oropharyngeal erythema.  Eyes:     General: No scleral icterus.       Right eye: No discharge.        Left eye: No discharge.     Extraocular Movements: Extraocular movements intact.     Conjunctiva/sclera: Conjunctivae normal.     Pupils: Pupils are equal, round, and reactive to light.  Neck:     Vascular: No carotid  bruit.  Cardiovascular:     Rate and Rhythm: Normal rate and regular rhythm.     Pulses: Normal pulses.     Heart sounds: No murmur heard.    No friction rub. No gallop.  Pulmonary:     Effort: Pulmonary effort is normal. No respiratory distress.     Breath sounds: Normal breath sounds. No stridor. No wheezing, rhonchi or rales.  Chest:     Chest wall: No tenderness.  Abdominal:     General: Abdomen is flat. Bowel sounds are normal. There is no distension.     Palpations: Abdomen is soft. There is no mass.     Tenderness: There is no abdominal tenderness. There is no right CVA tenderness, left CVA tenderness, guarding or rebound.     Hernia: No hernia is present.  Genitourinary:    Comments: Breast and pelvic exams deferred with shared decision making Musculoskeletal:        General: No swelling, tenderness, deformity or signs of injury.     Cervical back: Normal range of motion and neck supple. No rigidity. No muscular tenderness.     Right lower leg: No edema.     Left lower leg: No edema.  Lymphadenopathy:     Cervical: No cervical adenopathy.  Skin:    General: Skin is warm and dry.     Capillary Refill: Capillary  refill takes less than 2 seconds.     Coloration: Skin is not jaundiced or pale.     Findings: No bruising, erythema, lesion or rash.  Neurological:     General: No focal deficit present.     Mental Status: She is alert and oriented to person, place, and time. Mental status is at baseline.     Cranial Nerves: No cranial nerve deficit.     Sensory: No sensory deficit.     Motor: No weakness.     Coordination: Coordination normal.     Gait: Gait normal.     Deep Tendon Reflexes: Reflexes normal.  Psychiatric:        Mood and Affect: Mood normal.        Behavior: Behavior normal.        Thought Content: Thought content normal.        Judgment: Judgment normal.     Results for orders placed or performed in visit on 04/10/22  Urinalysis, Routine w reflex  microscopic  Result Value Ref Range   Specific Gravity, UA 1.025 1.005 - 1.030   pH, UA 5.5 5.0 - 7.5   Color, UA Yellow Yellow   Appearance Ur Clear Clear   Leukocytes,UA Negative Negative   Protein,UA Negative Negative/Trace   Glucose, UA Negative Negative   Ketones, UA Negative Negative   RBC, UA Negative Negative   Bilirubin, UA Negative Negative   Urobilinogen, Ur 0.2 0.2 - 1.0 mg/dL   Nitrite, UA Negative Negative      Assessment & Plan:   Problem List Items Addressed This Visit       Other   Anxiety    Doing better, but not quite there. Will increase her celexa to 40mg  and recheck 1 month. Continue PRN klonopin. Call with any concerns. Continue to monitor.       Relevant Medications   citalopram (CELEXA) 40 MG tablet   Other Visit Diagnoses     Routine general medical examination at a health care facility    -  Primary   Vaccines up to date. Screening labs checked today. Pap up to date. Continue diet and exercise. Call with any concerns.    Relevant Orders   CBC with Differential/Platelet   Comprehensive metabolic panel   TSH   Urinalysis, Routine w reflex microscopic (Completed)   Screening for cardiovascular condition       Labs drawn today. Await results.   Relevant Orders   Lipid panel   Family history of cancer       Very anxious about this. Would like to talk to genetic counselor. Referral generated today.   Relevant Orders   Ambulatory referral to Genetics        Follow up plan: Return in about 4 weeks (around 05/08/2022) for virtual OK.   LABORATORY TESTING:  - Pap smear: up to date  IMMUNIZATIONS:   - Tdap: Tetanus vaccination status reviewed: last tetanus booster within 10 years. - Influenza: Postponed to flu season - Pneumovax: Not applicable - Prevnar: Not applicable - COVID: Refused - HPV: Refused  PATIENT COUNSELING:   Advised to take 1 mg of folate supplement per day if capable of pregnancy.   Sexuality: Discussed sexually  transmitted diseases, partner selection, use of condoms, avoidance of unintended pregnancy  and contraceptive alternatives.   Advised to avoid cigarette smoking.  I discussed with the patient that most people either abstain from alcohol or drink within safe limits (<=14/week and <=4 drinks/occasion for males, <=7/weeks  and <= 3 drinks/occasion for females) and that the risk for alcohol disorders and other health effects rises proportionally with the number of drinks per week and how often a drinker exceeds daily limits.  Discussed cessation/primary prevention of drug use and availability of treatment for abuse.   Diet: Encouraged to adjust caloric intake to maintain  or achieve ideal body weight, to reduce intake of dietary saturated fat and total fat, to limit sodium intake by avoiding high sodium foods and not adding table salt, and to maintain adequate dietary potassium and calcium preferably from fresh fruits, vegetables, and low-fat dairy products.    stressed the importance of regular exercise  Injury prevention: Discussed safety belts, safety helmets, smoke detector, smoking near bedding or upholstery.   Dental health: Discussed importance of regular tooth brushing, flossing, and dental visits.    NEXT PREVENTATIVE PHYSICAL DUE IN 1 YEAR. Return in about 4 weeks (around 05/08/2022) for virtual OK.

## 2022-04-10 NOTE — Assessment & Plan Note (Signed)
Doing better, but not quite there. Will increase her celexa to 40mg  and recheck 1 month. Continue PRN klonopin. Call with any concerns. Continue to monitor.

## 2022-04-11 LAB — CBC WITH DIFFERENTIAL/PLATELET
Basophils Absolute: 0 10*3/uL (ref 0.0–0.2)
Basos: 0 %
EOS (ABSOLUTE): 0.1 10*3/uL (ref 0.0–0.4)
Eos: 1 %
Hematocrit: 39 % (ref 34.0–46.6)
Hemoglobin: 12.9 g/dL (ref 11.1–15.9)
Immature Grans (Abs): 0 10*3/uL (ref 0.0–0.1)
Immature Granulocytes: 0 %
Lymphocytes Absolute: 2.3 10*3/uL (ref 0.7–3.1)
Lymphs: 32 %
MCH: 28.3 pg (ref 26.6–33.0)
MCHC: 33.1 g/dL (ref 31.5–35.7)
MCV: 86 fL (ref 79–97)
Monocytes Absolute: 0.4 10*3/uL (ref 0.1–0.9)
Monocytes: 6 %
Neutrophils Absolute: 4.5 10*3/uL (ref 1.4–7.0)
Neutrophils: 61 %
Platelets: 244 10*3/uL (ref 150–450)
RBC: 4.56 x10E6/uL (ref 3.77–5.28)
RDW: 13.2 % (ref 11.7–15.4)
WBC: 7.3 10*3/uL (ref 3.4–10.8)

## 2022-04-11 LAB — COMPREHENSIVE METABOLIC PANEL
ALT: 25 IU/L (ref 0–32)
AST: 22 IU/L (ref 0–40)
Albumin/Globulin Ratio: 1.7 (ref 1.2–2.2)
Albumin: 4.7 g/dL (ref 3.8–4.8)
Alkaline Phosphatase: 66 IU/L (ref 44–121)
BUN/Creatinine Ratio: 8 — ABNORMAL LOW (ref 9–23)
BUN: 7 mg/dL (ref 6–20)
Bilirubin Total: 0.2 mg/dL (ref 0.0–1.2)
CO2: 22 mmol/L (ref 20–29)
Calcium: 9.5 mg/dL (ref 8.7–10.2)
Chloride: 102 mmol/L (ref 96–106)
Creatinine, Ser: 0.83 mg/dL (ref 0.57–1.00)
Globulin, Total: 2.7 g/dL (ref 1.5–4.5)
Glucose: 93 mg/dL (ref 70–99)
Potassium: 3.9 mmol/L (ref 3.5–5.2)
Sodium: 138 mmol/L (ref 134–144)
Total Protein: 7.4 g/dL (ref 6.0–8.5)
eGFR: 97 mL/min/{1.73_m2} (ref >59)

## 2022-04-11 LAB — LIPID PANEL
Chol/HDL Ratio: 4.5 ratio — ABNORMAL HIGH (ref 0.0–4.4)
Cholesterol, Total: 231 mg/dL — ABNORMAL HIGH (ref 100–199)
HDL: 51 mg/dL (ref >39)
LDL Chol Calc (NIH): 154 mg/dL — ABNORMAL HIGH (ref 0–99)
Triglycerides: 146 mg/dL (ref 0–149)
VLDL Cholesterol Cal: 26 mg/dL (ref 5–40)

## 2022-04-11 LAB — TSH: TSH: 1.32 u[IU]/mL (ref 0.450–4.500)

## 2022-04-15 IMAGING — DX DG CHEST 2V
3 series · 3 of 3 positions shown · non-contrast
Comparison: None.

CLINICAL DATA: Shortness of breath.  Mild cough and fever.

EXAM:
CHEST - 2 VIEW

[chest pa]
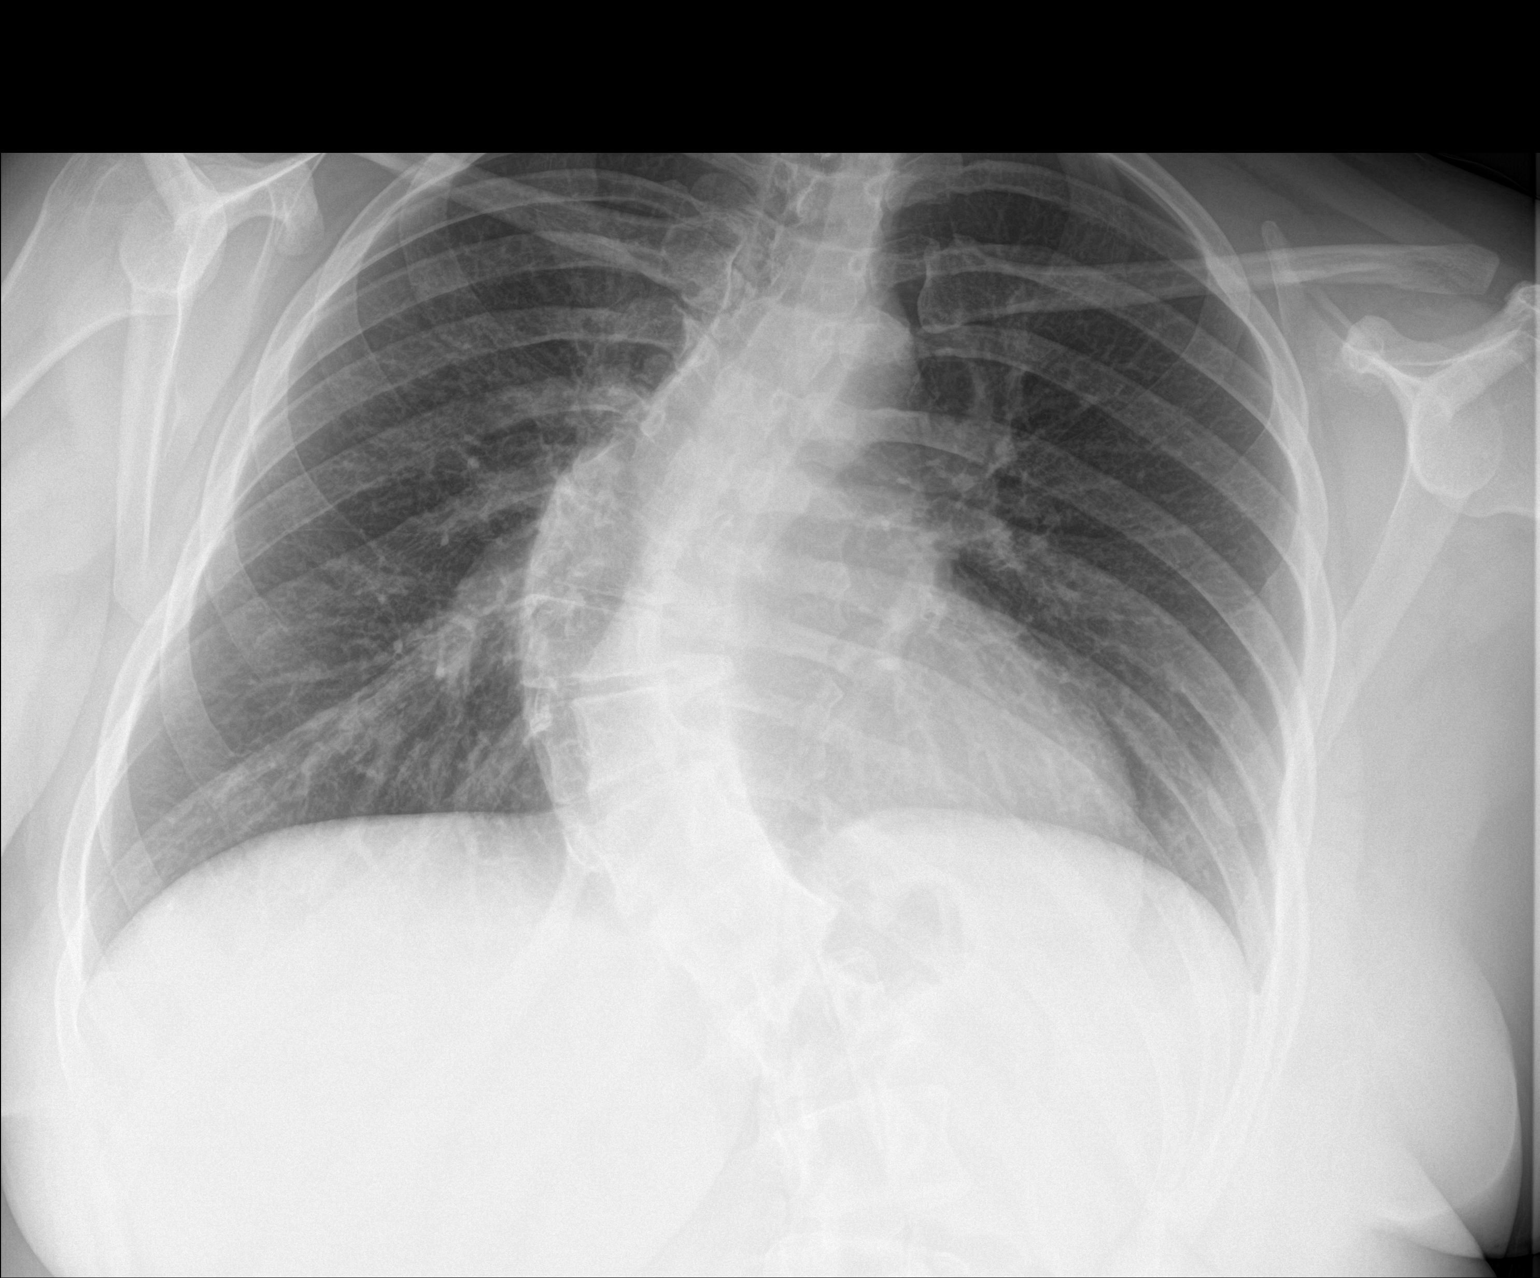

[chest lat (1 of 2)]
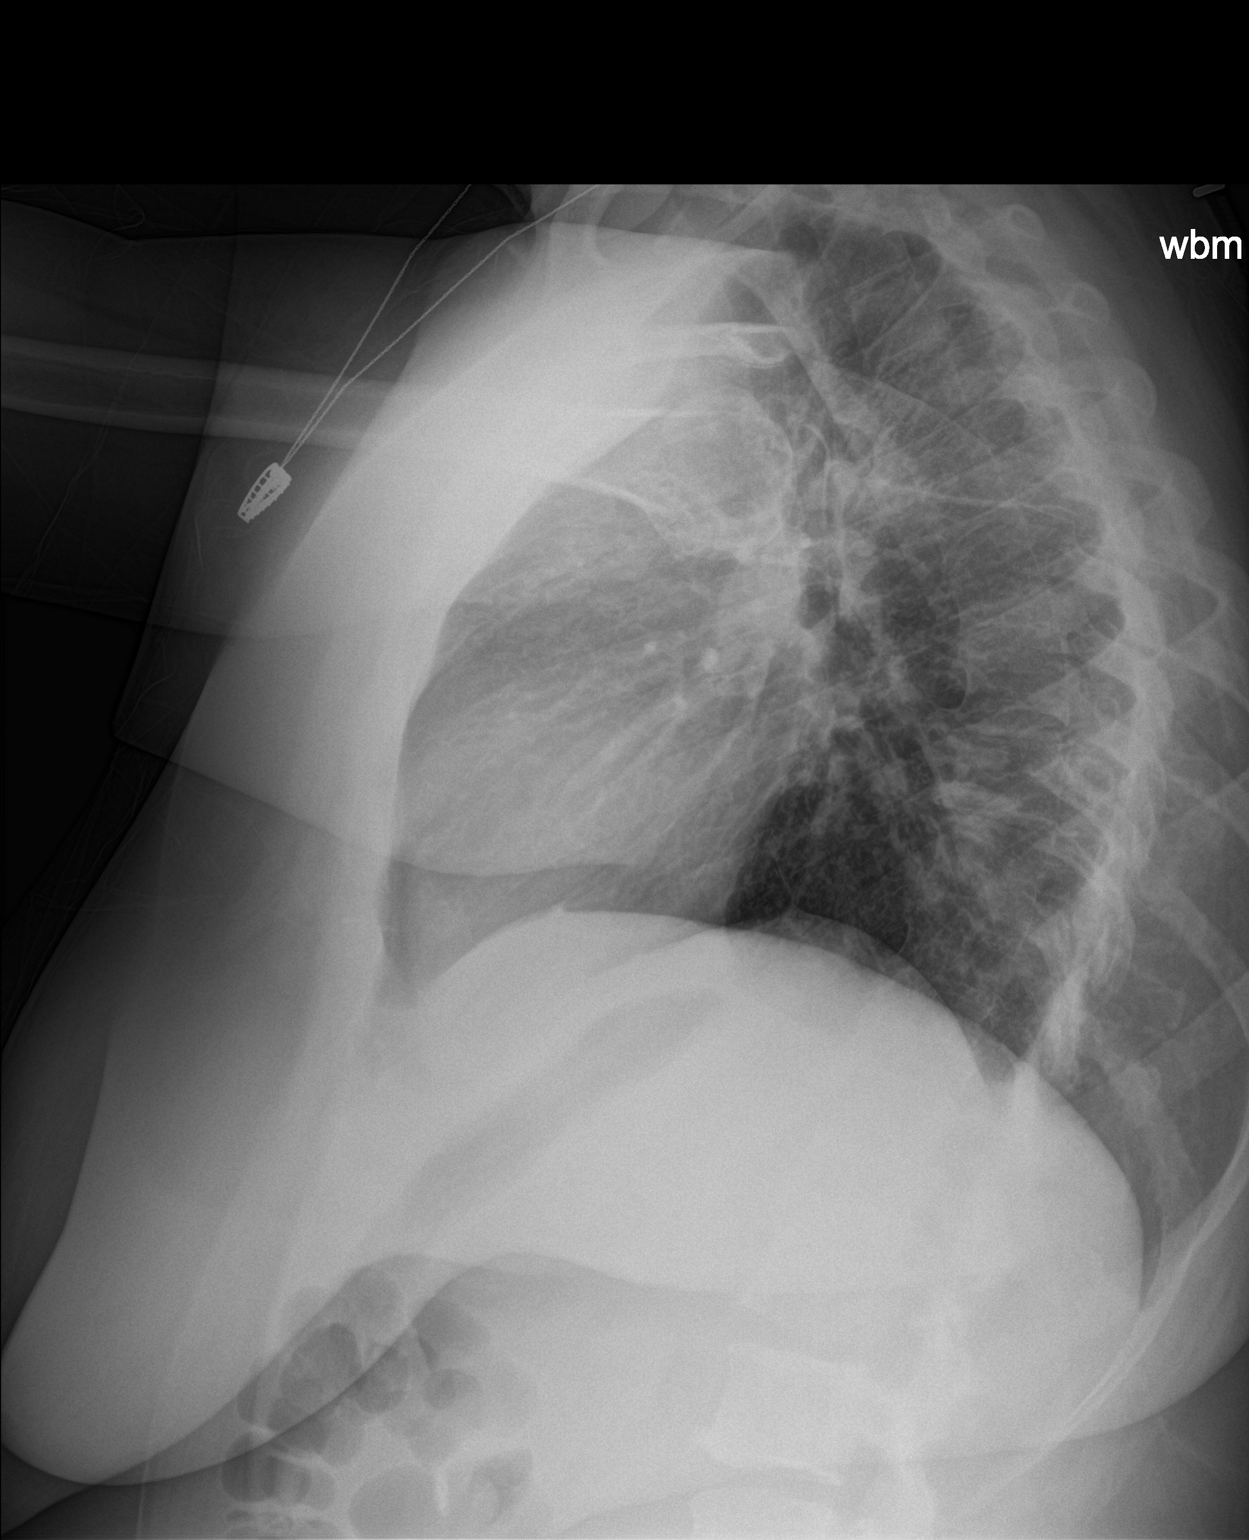

[chest lat (2 of 2)]
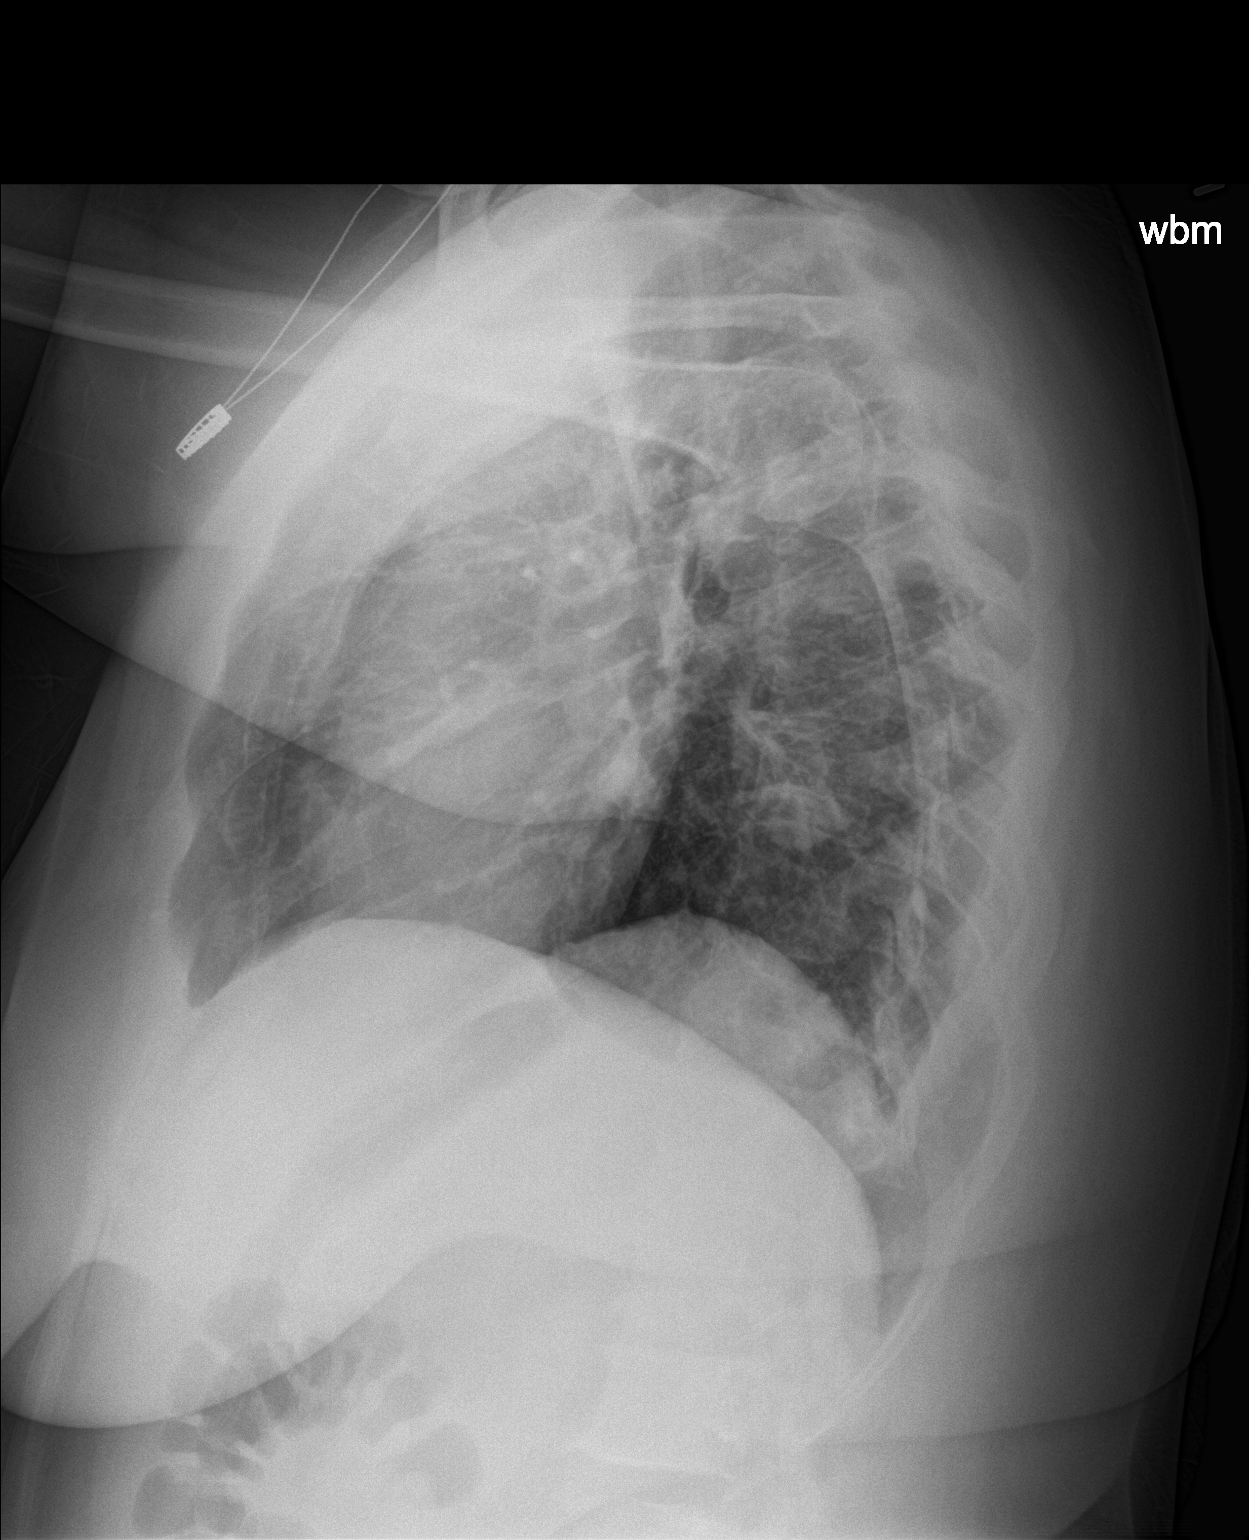

[3 of 3 positions shown; findings below may reference images not displayed]

FINDINGS: The heart size and mediastinal contours are within normal limits. No
focal airspace consolidation. No pleural effusion. No pneumothorax.
Scoliotic curvature of the thoracolumbar spine.
IMPRESSION: No active cardiopulmonary disease.

## 2022-04-21 ENCOUNTER — Encounter: Payer: Self-pay | Admitting: Family Medicine

## 2022-04-25 ENCOUNTER — Other Ambulatory Visit: Payer: Self-pay | Admitting: Family Medicine

## 2022-04-25 MED ORDER — FLUCONAZOLE 150 MG PO TABS: 150.0000 mg | ORAL_TABLET | Freq: Every day | ORAL | 1 refills | Status: DC

## 2022-04-26 DIAGNOSIS — Z1371 Encounter for nonprocreative screening for genetic disease carrier status: Secondary | ICD-10-CM

## 2022-04-26 DIAGNOSIS — Z8041 Family history of malignant neoplasm of ovary: Secondary | ICD-10-CM

## 2022-04-26 HISTORY — DX: Family history of malignant neoplasm of ovary: Z80.41

## 2022-04-26 HISTORY — DX: Encounter for nonprocreative screening for genetic disease carrier status: Z13.71

## 2022-05-06 ENCOUNTER — Inpatient Hospital Stay: Payer: 59 | Attending: Oncology | Admitting: Licensed Clinical Social Worker

## 2022-05-06 ENCOUNTER — Encounter: Payer: Self-pay | Admitting: Licensed Clinical Social Worker

## 2022-05-06 ENCOUNTER — Inpatient Hospital Stay: Payer: 59

## 2022-05-06 DIAGNOSIS — Z8042 Family history of malignant neoplasm of prostate: Secondary | ICD-10-CM

## 2022-05-06 DIAGNOSIS — Z8041 Family history of malignant neoplasm of ovary: Secondary | ICD-10-CM

## 2022-05-06 NOTE — Progress Notes (Signed)
REFERRING PROVIDER: Valerie Roys, DO [place] [id number]  PRIMARY PROVIDER:  Park Liter P, DO  PRIMARY REASON FOR VISIT:  1. Family history of ovarian cancer   2. Family history of prostate cancer      HISTORY OF PRESENT ILLNESS:   Alexa Diaz, a 31 y.o. female, was seen for a [place] cancer genetics consultation at the request of Dr. Wynetta Emery due to a family history of cancer.  Alexa Diaz presents to clinic today to discuss the possibility of a hereditary predisposition to cancer, genetic testing, and to further clarify her future cancer risks, as well as potential cancer risks for family members.   Alexa Diaz is a 31 y.o. female with no personal history of cancer.    CANCER HISTORY:  Oncology History   No history exists.     RISK FACTORS:  Menarche was at age 58-13.  First live birth at age 61.  OCP use for approximately 5 years.  Ovaries intact: yes.  Hysterectomy: no.  Menopausal status: premenopausal.  HRT use: 0 years. Colonoscopy: no; not examined. Mammogram within the last year: no. Number of breast biopsies: 0. Up to date with pelvic exams: yes.  Past Medical History:  Diagnosis Date   Allergic rhinitis    Back pain    3 bulging discs, arthritis and scoliosis 9/15   Chronic post-traumatic stress disorder (PTSD)    Herpes simplex virus infection    1 and 2   Reduction defect of left lower extremity    Short L leg- wears lift    Past Surgical History:  Procedure Laterality Date   NO PAST SURGERIES      FAMILY HISTORY:  We obtained a detailed, 4-generation family history.  Significant diagnoses are listed below: Family History  Problem Relation Age of Onset   Prostate cancer Father        dx late 51s   Alcohol abuse Father    Arthritis Father    Hyperlipidemia Father    Hypertension Father    Thyroid disease Brother    Ovarian cancer Maternal Grandmother    Kidney cancer Maternal Grandmother    Lung cancer Maternal  Grandmother    Ovarian cancer Other    Melanoma Other    Alexa Diaz has 1 daughter, 8, 1 sister, 33, and 1 paternal half brother, mid 62s.   Alexa Diaz mother is living at 69. Maternal grandmother had kidney cancer, ovarian cancer in her 14s, and now lung cancer at 51. Her sister, patient's great aunt, also had ovarian cancer and passed from it recently in her 65s. Their uncle had melanoma and passed from it. Patient's grandfather passed over age 42.  Alexa Diaz father has a history of prostate cancer in his late 18s. No other known cancers on this side of the family.  Alexa Diaz is unaware of previous family history of genetic testing for hereditary cancer risks. There is no reported Ashkenazi Jewish ancestry. There is no known consanguinity.    GENETIC COUNSELING ASSESSMENT: Alexa Diaz is a 31 y.o. female with a family history of ovarian cancer which is somewhat suggestive of a hereditary cancer syndrome and predisposition to cancer. We, therefore, discussed and recommended the following at today's visit.   DISCUSSION: We discussed that approximately 10-20% of ovarian cancer is hereditary. Most cases of hereditary ovarian cancer are associated with BRCA1/BRCA2 genes, although there are other genes associated with hereditary cancer as well. Cancers and risks are gene specific.  We discussed that testing is beneficial for several reasons including knowing about cancer risks, identifying potential screening and risk-reduction options that may be appropriate, and to understand if other family members could be at risk for cancer and allow them to undergo genetic testing.   We reviewed the characteristics, features and inheritance patterns of hereditary cancer syndromes. We also discussed genetic testing, including the appropriate family members to test, the process of testing, insurance coverage and turn-around-time for results. We discussed the implications of a negative, positive and/or variant  of uncertain significant result. We recommended Alexa Diaz pursue genetic testing for the [facility] Multi-Cancer+RNA gene panel.   Based on Alexa Diaz's family history of cancer, she meets medical criteria for genetic testing. Despite that she meets criteria, she may still have an out of pocket cost. We discussed that if her out of pocket cost for testing is over $100, the laboratory will call and confirm whether she wants to proceed with testing.  If the out of pocket cost of testing is less than $100 she will be billed by the genetic testing laboratory.   PLAN: After considering the risks, benefits, and limitations, Alexa Diaz provided informed consent to pursue genetic testing and the blood sample was sent to [facility] for analysis of the Multi-Cancer+RNA panel. Results should be available within approximately 2-3 weeks' time, at which point they will be disclosed by telephone to Alexa Diaz, as will any additional recommendations warranted by these results. Alexa Diaz will receive a summary of her genetic counseling visit and a copy of her results once available. This information will also be available in Epic.   Alexa Diaz questions were answered to her satisfaction today. Our contact information was provided should additional questions or concerns arise. Thank you for the referral and allowing [place] to share in the care of your patient.   Alexa Rogue, MS, [facility] Genetic Counselor [email or web address] Phone: [phone]  The patient was seen for a total of 25 minutes in face-to-face genetic counseling.  Dr. Grayland Ormond was available for discussion regarding this case.   _______________________________________________________________________ For Office Staff:  Number of people involved in session:1 Was an Intern/ student involved with case: no

## 2022-05-13 ENCOUNTER — Telehealth: Payer: 59 | Admitting: Family Medicine

## 2022-05-30 ENCOUNTER — Encounter: Payer: Self-pay | Admitting: Family Medicine

## 2022-06-10 ENCOUNTER — Telehealth: Payer: Self-pay | Admitting: Licensed Clinical Social Worker

## 2022-06-17 ENCOUNTER — Ambulatory Visit: Payer: 59 | Admitting: Family Medicine

## 2022-06-17 ENCOUNTER — Encounter: Payer: Self-pay | Admitting: Family Medicine

## 2022-06-17 VITALS — BP 103/70 | HR 75 | Wt 263.5 lb

## 2022-06-17 DIAGNOSIS — F419 Anxiety disorder, unspecified: Secondary | ICD-10-CM | POA: Diagnosis not present

## 2022-06-17 DIAGNOSIS — R109 Unspecified abdominal pain: Secondary | ICD-10-CM

## 2022-06-17 MED ORDER — POLYETHYLENE GLYCOL 3350 17 GM/SCOOP PO POWD: 17.0000 g | Freq: Two times a day (BID) | ORAL | 1 refills | Status: DC | PRN

## 2022-06-17 MED ORDER — ESCITALOPRAM OXALATE 20 MG PO TABS: 20.0000 mg | ORAL_TABLET | Freq: Every day | ORAL | 3 refills | Status: DC

## 2022-06-17 NOTE — Progress Notes (Signed)
BP 103/70   Pulse 75   Wt 263 lb 8 oz (119.5 kg)   SpO2 97%   BMI 43.85 kg/m    Subjective:    Patient ID: Alexa Diaz, female    DOB: 1991/07/07, 31 y.o.   MRN: [id number]  HPI: Alexa Diaz is a 31 y.o. female  Chief Complaint  Patient presents with  . Anxiety  . bowel concerns    Patient states her bowels have been different lately, some days it is hard to have a BM, and some days her stools are loose. Patient has noticed clear mucus in her BM.    ANXIETY/STRESS Duration; chronic Status:better Anxious mood: yes  Excessive worrying: no Irritability: no  Sweating: no Nausea: no Palpitations:no Hyperventilation: no Panic attacks: no Agoraphobia: no  Obscessions/compulsions: no Depressed mood: yes    06/17/2022    4:37 PM 04/10/2022    4:25 PM 02/18/2022    4:27 PM 09/13/2021    4:10 PM 06/27/2021    2:01 PM  Depression screen PHQ 2/9  Decreased Interest _0 0 0  Down, Depressed, Hopeless _1 0 0  PHQ - 2 Score _2 0 0  Altered sleeping 0 1 0 0   Tired, decreased energy _3 Change in appetite 1 0 1 0   Feeling bad or failure about yourself  _4 0   Trouble concentrating 0 0 3 0   Moving slowly or fidgety/restless 1 0 2 0   Suicidal thoughts 0 0 0 0   PHQ-9 Score _5 Difficult doing work/chores Somewhat difficult Somewhat difficult         06/17/2022    4:37 PM 04/10/2022    4:26 PM 02/18/2022    4:28 PM 09/13/2021    4:10 PM  GAD 7 : Generalized Anxiety Score  Nervous, Anxious, on Edge _6 Control/stop worrying 0 _7 Worry too much - different things _8 Trouble relaxing _9 Restless _10 0  Easily annoyed or irritable 0 _11 Afraid - awful might happen 0 0 3 0  Total GAD 7 Score _12 Anxiety Difficulty Somewhat difficult Not difficult at all Very difficult Somewhat difficult   Anhedonia: {Blank single:19197::"yes","no"} Weight changes: {Blank single:19197::"yes","no"} Insomnia: {Blank  single:19197::"yes","no"} {Blank single:19197::"hard to fall asleep","hard to stay asleep"}  Hypersomnia: {Blank single:19197::"yes","no"} Fatigue/loss of energy: yes Feelings of worthlessness: {Blank single:19197::"yes","no"} Feelings of guilt: {Blank single:19197::"yes","no"} Impaired concentration/indecisiveness: {Blank single:19197::"yes","no"} Suicidal ideations: {Blank single:19197::"yes","no"}  Crying spells: {Blank single:19197::"yes","no"} Recent Stressors/Life Changes: {Blank single:19197::"yes","no"}   Relationship problems: {Blank single:19197::"yes","no"}   Family stress: {Blank single:19197::"yes","no"}     Financial stress: {Blank single:19197::"yes","no"}    Job stress: {Blank single:19197::"yes","no"}    Recent death/loss: {Blank single:19197::"yes","no"}   ABDOMINAL ISSUES Duration:  about 3 weeks Nature: bloating, cramping Location: diffuse  Severity: moderate  Radiation: no Frequency: intermittent Treatments attempted: none Constipation: intermittent Diarrhea: yes Episodes of diarrhea/day: Mucous in the stool: yes Heartburn: no Bloating:yes Flatulence: yes Nausea: yes- better Vomiting: no Melena or hematochezia: no Rash: no Jaundice: no Fever: no Weight loss: no  Relevant past medical, surgical, family and social history reviewed and updated as indicated. Interim medical history since our last visit reviewed. Allergies and medications reviewed and updated.  Review of Systems  Per HPI unless specifically indicated above  Objective:    BP 103/70   Pulse 75   Wt 263 lb 8 oz (119.5 kg)   SpO2 97%   BMI 43.85 kg/m   Wt Readings from Last 3 Encounters:  06/17/22 263 lb 8 oz (119.5 kg)  04/10/22 257 lb (116.6 kg)  02/18/22 257 lb 9.6 oz (116.8 kg)    Physical Exam  Results for orders placed or performed in visit on 04/10/22  CBC with Differential/Platelet  Result Value Ref Range   WBC 7.3 3.4 - 10.8 x10E3/uL   RBC 4.56 3.77 - 5.28  x10E6/uL   Hemoglobin 12.9 11.1 - 15.9 g/dL   Hematocrit 39.0 34.0 - 46.6 %   MCV 86 79 - 97 fL   MCH 28.3 26.6 - 33.0 pg   MCHC 33.1 31.5 - 35.7 g/dL   RDW 13.2 11.7 - 15.4 %   Platelets 244 150 - 450 x10E3/uL   Neutrophils 61 Not Estab. %   Lymphs 32 Not Estab. %   Monocytes 6 Not Estab. %   Eos 1 Not Estab. %   Basos 0 Not Estab. %   Neutrophils Absolute 4.5 1.4 - 7.0 x10E3/uL   Lymphocytes Absolute 2.3 0.7 - 3.1 x10E3/uL   Monocytes Absolute 0.4 0.1 - 0.9 x10E3/uL   EOS (ABSOLUTE) 0.1 0.0 - 0.4 x10E3/uL   Basophils Absolute 0.0 0.0 - 0.2 x10E3/uL   Immature Granulocytes 0 Not Estab. %   Immature Grans (Abs) 0.0 0.0 - 0.1 x10E3/uL  Comprehensive metabolic panel  Result Value Ref Range   Glucose 93 70 - 99 mg/dL   BUN 7 6 - 20 mg/dL   Creatinine, Ser 0.83 0.57 - 1.00 mg/dL   eGFR 97 >59 mL/min/1.73   BUN/Creatinine Ratio 8 (L) 9 - 23   Sodium 138 134 - 144 mmol/L   Potassium 3.9 3.5 - 5.2 mmol/L   Chloride 102 96 - 106 mmol/L   CO2 22 20 - 29 mmol/L   Calcium 9.5 8.7 - 10.2 mg/dL   Total Protein 7.4 6.0 - 8.5 g/dL   Albumin 4.7 3.8 - 4.8 g/dL   Globulin, Total 2.7 1.5 - 4.5 g/dL   Albumin/Globulin Ratio 1.7 1.2 - 2.2   Bilirubin Total 0.2 0.0 - 1.2 mg/dL   Alkaline Phosphatase 66 44 - 121 IU/L   AST 22 0 - 40 IU/L   ALT 25 0 - 32 IU/L  Lipid panel  Result Value Ref Range   Cholesterol, Total 231 (H) 100 - 199 mg/dL   Triglycerides 146 0 - 149 mg/dL   HDL 51 >39 mg/dL   VLDL Cholesterol Cal 26 5 - 40 mg/dL   LDL Chol Calc (NIH) 154 (H) 0 - 99 mg/dL   Chol/HDL Ratio 4.5 (H) 0.0 - 4.4 ratio  TSH  Result Value Ref Range   TSH 1.320 0.450 - 4.500 uIU/mL  Urinalysis, Routine w reflex microscopic  Result Value Ref Range   Specific Gravity, UA 1.025 1.005 - 1.030   pH, UA 5.5 5.0 - 7.5   Color, UA Yellow Yellow   Appearance Ur Clear Clear   Leukocytes,UA Negative Negative   Protein,UA Negative Negative/Trace   Glucose, UA Negative Negative   Ketones, UA Negative  Negative   RBC, UA Negative Negative   Bilirubin, UA Negative Negative   Urobilinogen, Ur 0.2 0.2 - 1.0 mg/dL   Nitrite, UA Negative Negative      Assessment & Plan:   Problem List Items Addressed This Visit   None  Follow up plan: No follow-ups on file.

## 2022-06-19 NOTE — Assessment & Plan Note (Signed)
Sexual side effects from celexa. Will change to lexapro. Continue prn klonopin. Call with any concerns. Recheck 1 month.

## 2022-06-24 ENCOUNTER — Encounter: Payer: Self-pay | Admitting: Licensed Clinical Social Worker

## 2022-07-10 ENCOUNTER — Encounter: Payer: Self-pay | Admitting: Licensed Clinical Social Worker

## 2022-07-10 DIAGNOSIS — Z1379 Encounter for other screening for genetic and chromosomal anomalies: Secondary | ICD-10-CM | POA: Insufficient documentation

## 2022-07-10 NOTE — Telephone Encounter (Signed)
Attempted to reach patient x4 with genetic test results, no response. Will send patient letter with instructions to contact me for results.

## 2022-07-15 ENCOUNTER — Encounter: Payer: Self-pay | Admitting: Family Medicine

## 2022-07-15 ENCOUNTER — Ambulatory Visit: Payer: 59 | Admitting: Family Medicine

## 2022-07-15 VITALS — BP 109/74 | HR 65 | Temp 98.4°F | Wt 260.2 lb

## 2022-07-15 DIAGNOSIS — F419 Anxiety disorder, unspecified: Secondary | ICD-10-CM

## 2022-07-15 DIAGNOSIS — K5909 Other constipation: Secondary | ICD-10-CM | POA: Diagnosis not present

## 2022-07-15 DIAGNOSIS — R051 Acute cough: Secondary | ICD-10-CM

## 2022-07-15 MED ORDER — LINACLOTIDE 72 MCG PO CAPS: 72.0000 ug | ORAL_CAPSULE | Freq: Every day | ORAL | 3 refills | Status: DC

## 2022-07-15 MED ORDER — ESCITALOPRAM OXALATE 20 MG PO TABS: 20.0000 mg | ORAL_TABLET | Freq: Every day | ORAL | 1 refills | Status: DC

## 2022-07-15 NOTE — Assessment & Plan Note (Signed)
Did well with miralax. Will see if linzess is covered, otherwise continue miralax 3x a week. Call with any concerns.

## 2022-07-15 NOTE — Progress Notes (Signed)
BP 109/74   Pulse 65   Temp 98.4 F (36.9 C)   Wt 260 lb 3.2 oz (118 kg)   SpO2 98%   BMI 43.30 kg/m    Subjective:    Patient ID: Alexa Diaz, female    DOB: 1991/07/17, 31 y.o.   MRN: [id number]  HPI: Alexa Diaz is a 31 y.o. female  Chief Complaint  Patient presents with   Abdominal Cramping    Patient states she is not having as much cramping anymore, but is still having trouble having a BM.    Anxiety   URI    Patient states she has a lot of sinus pressure, cough, and congestion since last Thursday.    ANXIETY/STRESS Duration: chronic Status:better Anxious mood: no  Excessive worrying: no Irritability: no  Sweating: no Nausea: no Palpitations:no Hyperventilation: no Panic attacks: no Agoraphobia: no  Obscessions/compulsions: no Depressed mood: no    07/15/2022    2:58 PM 06/17/2022    4:37 PM 04/10/2022    4:25 PM 02/18/2022    4:27 PM 09/13/2021    4:10 PM  Depression screen PHQ 2/9  Decreased Interest 1 1 1 3  0  Down, Depressed, Hopeless 1 1 1 1  0  PHQ - 2 Score 2 2 2 4  0  Altered sleeping 0 0 1 0 0  Tired, decreased energy 1 2 2 3 1   Change in appetite 0 1 0 1 0  Feeling bad or failure about yourself  0 1 1 1  0  Trouble concentrating 0 0 0 3 0  Moving slowly or fidgety/restless 0 1 0 2 0  Suicidal thoughts 0 0 0 0 0  PHQ-9 Score 3 7 6 14 1   Difficult doing work/chores  Somewhat difficult Somewhat difficult        07/15/2022    2:58 PM 06/17/2022    4:37 PM 04/10/2022    4:26 PM 02/18/2022    4:28 PM  GAD 7 : Generalized Anxiety Score  Nervous, Anxious, on Edge 1 1 2 3   Control/stop worrying 1 0 1 3  Worry too much - different things 1 1 2 3   Trouble relaxing 0 1 1 3   Restless 0 1 1 1   Easily annoyed or irritable 1 0 1 3  Afraid - awful might happen 0 0 0 3  Total GAD 7 Score 4 4 8 19   Anxiety Difficulty  Somewhat difficult Not difficult at all Very difficult   Anhedonia: no Weight changes: no Insomnia: no   Hypersomnia:  no Fatigue/loss of energy: no Feelings of worthlessness: no Feelings of guilt: no Impaired concentration/indecisiveness: no Suicidal ideations: no  Crying spells: no Recent Stressors/Life Changes: no   Relationship problems: no   Family stress: no     Financial stress: no    Job stress: no    Recent death/loss: no  Belly is doing better, but still having issues with constipation. Still having issues with abdominal pain with eating things that are greasy.  UPPER RESPIRATORY TRACT INFECTION Duration: 5 days Worst symptom: congestion Fever: no Cough: yes Shortness of breath: yes Wheezing: no Chest pain: no Chest tightness: no Chest congestion: no Nasal congestion: yes Runny nose: yes Post nasal drip: yes Sneezing: no Sore throat: yes Swollen glands: no Sinus pressure: yes Headache: yes Face pain: no Toothache: no Ear pain: no  Ear pressure: no  Eyes red/itching:no Eye drainage/crusting: no  Vomiting: no Rash: no Fatigue: yes Sick contacts: yes Strep contacts: no  Context: stable  Recurrent sinusitis: no Relief with OTC cold/cough medications: no  Treatments attempted: tylenol, theraflu   Relevant past medical, surgical, family and social history reviewed and updated as indicated. Interim medical history since our last visit reviewed. Allergies and medications reviewed and updated.  Review of Systems  Constitutional: Negative.   Respiratory: Negative.    Cardiovascular: Negative.   Gastrointestinal:  Positive for constipation. Negative for abdominal distention, abdominal pain, anal bleeding, blood in stool, diarrhea, nausea, rectal pain and vomiting.  Musculoskeletal: Negative.   Psychiatric/Behavioral: Negative.      Per HPI unless specifically indicated above     Objective:    BP 109/74   Pulse 65   Temp 98.4 F (36.9 C)   Wt 260 lb 3.2 oz (118 kg)   SpO2 98%   BMI 43.30 kg/m   Wt Readings from Last 3 Encounters:  07/15/22 260 lb 3.2 oz (118  kg)  06/17/22 263 lb 8 oz (119.5 kg)  04/10/22 257 lb (116.6 kg)    Physical Exam Vitals and nursing note reviewed.  Constitutional:      General: She is not in acute distress.    Appearance: Normal appearance. She is not ill-appearing, toxic-appearing or diaphoretic.  HENT:     Head: Normocephalic and atraumatic.     Right Ear: External ear normal.     Left Ear: External ear normal.     Nose: Nose normal.     Mouth/Throat:     Mouth: Mucous membranes are moist.     Pharynx: Oropharynx is clear.  Eyes:     General: No scleral icterus.       Right eye: No discharge.        Left eye: No discharge.     Extraocular Movements: Extraocular movements intact.     Conjunctiva/sclera: Conjunctivae normal.     Pupils: Pupils are equal, round, and reactive to light.  Cardiovascular:     Rate and Rhythm: Normal rate and regular rhythm.     Pulses: Normal pulses.     Heart sounds: Normal heart sounds. No murmur heard.    No friction rub. No gallop.  Pulmonary:     Effort: Pulmonary effort is normal. No respiratory distress.     Breath sounds: Normal breath sounds. No stridor. No wheezing, rhonchi or rales.  Chest:     Chest wall: No tenderness.  Musculoskeletal:        General: Normal range of motion.     Cervical back: Normal range of motion and neck supple.  Skin:    General: Skin is warm and dry.     Capillary Refill: Capillary refill takes less than 2 seconds.     Coloration: Skin is not jaundiced or pale.     Findings: No bruising, erythema, lesion or rash.  Neurological:     General: No focal deficit present.     Mental Status: She is alert and oriented to person, place, and time. Mental status is at baseline.  Psychiatric:        Mood and Affect: Mood normal.        Behavior: Behavior normal.        Thought Content: Thought content normal.        Judgment: Judgment normal.     Results for orders placed or performed in visit on 07/15/22  Rapid Strep  screen([facility])   Specimen: Other   Other  Result Value Ref Range   Strep Gp A Ag, IA W/Reflex Negative Negative  Culture,  Group A Strep   Other  Result Value Ref Range   Strep A Culture WILL FOLLOW   Influenza A & B (STAT)  Result Value Ref Range   Influenza A Negative Negative   Influenza B Negative Negative      Assessment & Plan:   Problem List Items Addressed This Visit       Digestive   Chronic constipation    Did well with miralax. Will see if linzess is covered, otherwise continue miralax 3x a week. Call with any concerns.         Other   Anxiety    Under good control on current regimen. Continue current regimen. Continue to monitor. Call with any concerns. Refills given.        Relevant Medications   escitalopram (LEXAPRO) 20 MG tablet   Other Visit Diagnoses     Acute cough    -  Primary   Concern for COVID. Flu and strep negative. Await results. Continue symptomatic care. Call with any concerns.    Relevant Orders   Novel Coronavirus, NAA ([facility])   Influenza A & B (STAT) (Completed)   Rapid Strep screen([facility]) (Completed)        Follow up plan: Return in about 6 months (around 01/13/2023).

## 2022-07-15 NOTE — Assessment & Plan Note (Signed)
Under good control on current regimen. Continue current regimen. Continue to monitor. Call with any concerns. Refills given.   

## 2022-07-17 ENCOUNTER — Telehealth: Payer: Self-pay

## 2022-07-17 ENCOUNTER — Ambulatory Visit: Payer: 59 | Admitting: Family Medicine

## 2022-07-17 LAB — NOVEL CORONAVIRUS, NAA: SARS-CoV-2, NAA: NOT DETECTED

## 2022-07-17 NOTE — Telephone Encounter (Signed)
PA initaited via CoverMyMeds for Linzess 72 MCG capsules Key: [id number] Waiting on determination

## 2022-07-18 LAB — CULTURE, GROUP A STREP: Strep A Culture: NEGATIVE

## 2022-07-18 LAB — RAPID STREP SCREEN (MED CTR MEBANE ONLY): Strep Gp A Ag, IA W/Reflex: NEGATIVE

## 2022-07-18 LAB — VERITOR FLU A/B WAIVED
Influenza A: NEGATIVE
Influenza B: NEGATIVE

## 2022-07-23 ENCOUNTER — Encounter: Payer: Self-pay | Admitting: Nurse Practitioner

## 2022-07-23 ENCOUNTER — Ambulatory Visit: Payer: 59 | Admitting: Nurse Practitioner

## 2022-07-23 VITALS — BP 120/80 | HR 60 | Temp 98.3°F | Ht 65.0 in | Wt 258.6 lb

## 2022-07-23 DIAGNOSIS — Z3201 Encounter for pregnancy test, result positive: Secondary | ICD-10-CM | POA: Diagnosis not present

## 2022-07-23 LAB — PREGNANCY, URINE: Preg Test, Ur: POSITIVE — AB

## 2022-07-23 NOTE — Assessment & Plan Note (Signed)
Positive at home and on urine pregnancy test in office today.  LMP approx 04/24/22 -- may place her around 13 week.  Will obtain blood work.  Recommend she stop Klonopin, may continue Lexapro for now as with SSRI benefit outweighs risk on review -- discussed with her OB may change to alternate SSRI in future.  Take Zofran as needed for nausea.  Referral to GYN placed.  Start prenatal vitamin.

## 2022-07-23 NOTE — Telephone Encounter (Signed)
PA has been approved. Will notify patient.

## 2022-07-23 NOTE — Patient Instructions (Signed)
Exercise During Pregnancy Exercise is an important part of being healthy for people of all ages. Exercise improves the function of your heart and lungs and helps you maintain strength, flexibility, and a healthy body weight. Exercise also boosts energy levels and elevates mood. Most women should exercise regularly during pregnancy. Exercise routines may need to change as your pregnancy progresses. In rare cases, women with certain medical conditions or complications may be asked to limit or avoid exercise during pregnancy. Your health care provider will give you information on what will work for you. How does this affect me? Along with maintaining general strength and flexibility, exercising during pregnancy can help: Keep strength in muscles that are used during labor and childbirth. Decrease low back pain or symptoms of depression. Control weight gain during pregnancy. Reduce the risk of needing insulin if you develop diabetes during pregnancy. Decrease the risk of cesarean delivery. Speed up your recovery after giving birth. Relieve constipation. How does this affect my baby? Exercise can help you have a healthy pregnancy. Exercise does not cause early (premature) birth. It will not cause your baby to weigh less at birth. What exercises can I do? Many exercises are safe for you to do during pregnancy. Do a variety of exercises that safely increase your heart and breathing rates and help you build and maintain muscle strength. Do exercises exactly as told by your health care provider. You may do these exercises: Walking. Swimming. Water aerobics. Riding a stationary bike. Modified yoga or Pilates. Tell your instructor that you are pregnant. Avoid overstretching, and avoid lying on your back for long periods of time. Running or jogging. Choose this type of exercise only if: You ran or jogged regularly before your pregnancy. You can run or jog and still talk in complete sentences. What  exercises should I avoid? You may be told to limit high-intensity exercise depending on your level of fitness and whether you exercised regularly before you were pregnant. You can tell that you are exercising at a high intensity if you are breathing much harder and faster and cannot hold a conversation while exercising. You must avoid: Contact sports. Activities that put you at risk for falling on or being hit in the belly, such as downhill skiing, waterskiing, surfing, rock climbing, cycling, gymnastics, and horseback riding. Scuba diving. Skydiving. Hot yoga or hot Pilates. These activities take place in a room that is heated to high temperatures. Jogging or running, unless you jogged or ran regularly before you were pregnant. While jogging or running, you should always be able to talk in full sentences. Do not run or jog so fast that you are unable to have a conversation. Do not exercise at more than 6,000 feet above sea level (high elevation) if you are not used to exercising at high elevation. How do I exercise in a safe way?  Avoid overheating. Do not exercise in very high temperatures. Wear loose-fitting, breathable clothes. Avoid dehydration. Drink enough fluid before, during, and after exercise to keep your urine pale yellow. Avoid overstretching. Because of hormone changes during pregnancy, it is easy to overstretch muscles, tendons, and ligaments. Start slowly and ask your health care provider to recommend the types of exercise that are safe for you. Do not exercise to lose weight. Wear a sports bra to support your breasts. Avoid standing still or lying flat on your back as much as you can. Follow these instructions at home: Exercise on most days or all days of the week. Try to   exercise for 30 minutes a day, 5 days a week, unless your health care provider tells you not to. If you actively exercised before your pregnancy and you are healthy, your health care provider may tell you to  continue to do moderate-intensity to high-intensity exercise. If you are just starting to exercise or did not exercise much before your pregnancy, your health care provider may tell you to do low-intensity to moderate-intensity exercise. Questions to ask your health care provider Is exercise safe for me? What are signs that I should stop exercising? Does my health condition mean that I should not exercise during pregnancy? When should I avoid exercising during pregnancy? Stop exercising and contact a health care provider if: You have any unusual symptoms such as: Mild contractions of the uterus or cramps in the abdomen. A dizzy feeling that does not go away when you rest. Stop exercising and get help right away if: You have any unusual symptoms such as: Sudden, severe pain in your low back or your belly. Regular, painful contractions of your uterus. Chest pain. Bleeding or fluid leaking from your vagina. Shortness of breath. Headache. Pain and swelling of your calves. Summary Most women should exercise regularly throughout pregnancy. In rare cases, women with certain medical conditions or complications may be asked to limit or avoid exercise during pregnancy. Do not exercise to lose weight during pregnancy. Your health care provider will tell you what level of physical activity is right for you. Stop exercising and contact a health care provider if you have unusual symptoms, such as mild contractions or dizziness. This information is not intended to replace advice given to you by your health care provider. Make sure you discuss any questions you have with your health care provider. Document Revised: 05/30/2020 Document Reviewed: 05/30/2020 [employer] Patient Education  2023 [employer]  

## 2022-07-23 NOTE — Progress Notes (Signed)
BP 120/80   Pulse 60   Temp 98.3 F (36.8 C) (Oral)   Ht 5\' 5"  (1.651 m)   Wt 258 lb 9.6 oz (117.3 kg)   SpO2 95%   BMI 43.03 kg/m    Subjective:    Patient ID: , female    DOB: 12-18-1990, 31 y.o.   MRN: [id number]  HPI: Alexa Diaz is a 31 y.o. female  Chief Complaint  Patient presents with   Medication Management    Patient says she wanting to make sure it is currently taking Klonopin and Escitalopram and wants to make sure she is OK to take the medication if she is expecting.    Possible Pregnancy    Patient says she took an at home pregnancy on Monday and it was positive. Patient says she was here today to confirm the pregnancy. Patient says she thinks first day of last period was at the beginning of July or end of the June.    PREGNANCY DIAGNOSIS Positive pregnancy testing at home -- nausea recently.   LMP: 04/24/22 Gravida/Para: 2/1 Home pregnancy test: positive x1 Nausea: yes  Vomiting: yes a lot Monday Breast tenderness: yes Abdominal pain:  a little cramping Vaginal bleeding: no Pregnancy or labor complications: last pregnancy during labor umbilical cord around neck  -- was induced Fetal abnormalities: no Contraception:  was on pill     07/23/2022    4:45 PM 07/15/2022    2:58 PM 06/17/2022    4:37 PM 04/10/2022    4:25 PM 02/18/2022    4:27 PM  Depression screen PHQ 2/9  Decreased Interest 1 1 1 1 3   Down, Depressed, Hopeless 1 1 1 1 1   PHQ - 2 Score 2 2 2 2 4   Altered sleeping 1 0 0 1 0  Tired, decreased energy 2 1 2 2 3   Change in appetite 0 0 1 0 1  Feeling bad or failure about yourself  1 0 1 1 1   Trouble concentrating 1 0 0 0 3  Moving slowly or fidgety/restless 0 0 1 0 2  Suicidal thoughts 0 0 0 0 0  PHQ-9 Score 7 3 7 6 14   Difficult doing work/chores Somewhat difficult  Somewhat difficult Somewhat difficult        07/23/2022    4:45 PM 07/15/2022    2:58 PM 06/17/2022    4:37 PM 04/10/2022    4:26 PM  GAD 7 : Generalized  Anxiety Score  Nervous, Anxious, on Edge 2 1 1 2   Control/stop worrying 1 1 0 1  Worry too much - different things 1 1 1 2   Trouble relaxing 0 0 1 1  Restless 0 0 1 1  Easily annoyed or irritable 1 1 0 1  Afraid - awful might happen 0 0 0 0  Total GAD 7 Score 5 4 4 8   Anxiety Difficulty Somewhat difficult  Somewhat difficult Not difficult at all     Relevant past medical, surgical, family and social history reviewed and updated as indicated. Interim medical history since our last visit reviewed. Allergies and medications reviewed and updated.  Review of Systems  Constitutional:  Negative for activity change, appetite change, diaphoresis, fatigue and fever.  Respiratory:  Negative for cough, chest tightness and shortness of breath.   Cardiovascular:  Negative for chest pain, palpitations and leg swelling.  Gastrointestinal:  Positive for abdominal pain (cramping occasional only) and nausea.  Neurological: Negative.   Psychiatric/Behavioral: Negative.  Per HPI unless specifically indicated above     Objective:    BP 120/80   Pulse 60   Temp 98.3 F (36.8 C) (Oral)   Ht 5\' 5"  (1.651 m)   Wt 258 lb 9.6 oz (117.3 kg)   SpO2 95%   BMI 43.03 kg/m   Wt Readings from Last 3 Encounters:  07/23/22 258 lb 9.6 oz (117.3 kg)  07/15/22 260 lb 3.2 oz (118 kg)  06/17/22 263 lb 8 oz (119.5 kg)    Physical Exam Vitals and nursing note reviewed.  Constitutional:      General: She is awake. She is not in acute distress.    Appearance: She is well-developed and well-groomed. She is obese. She is not ill-appearing or toxic-appearing.  HENT:     Head: Normocephalic.     Right Ear: Hearing normal.     Left Ear: Hearing normal.  Eyes:     General: Lids are normal.        Right eye: No discharge.        Left eye: No discharge.     Conjunctiva/sclera: Conjunctivae normal.     Pupils: Pupils are equal, round, and reactive to light.  Cardiovascular:     Rate and Rhythm: Normal rate  and regular rhythm.     Heart sounds: Normal heart sounds. No murmur heard.    No gallop.  Pulmonary:     Effort: Pulmonary effort is normal. No accessory muscle usage or respiratory distress.     Breath sounds: Normal breath sounds.  Abdominal:     General: Bowel sounds are normal.     Palpations: Abdomen is soft.  Musculoskeletal:     Cervical back: Normal range of motion and neck supple.     Right lower leg: No edema.     Left lower leg: No edema.  Skin:    General: Skin is warm and dry.  Neurological:     Mental Status: She is alert and oriented to person, place, and time.  Psychiatric:        Attention and Perception: Attention normal.        Mood and Affect: Mood normal.        Speech: Speech normal.        Behavior: Behavior normal. Behavior is cooperative.        Thought Content: Thought content normal.    Results for orders placed or performed in visit on 07/23/22  Pregnancy, urine  Result Value Ref Range   Preg Test, Ur Positive (A) Negative      Assessment & Plan:   Problem List Items Addressed This Visit       Other   Positive pregnancy test - Primary    Positive at home and on urine pregnancy test in office today.  LMP approx 04/24/22 -- may place her around 13 week.  Will obtain blood work.  Recommend she stop Klonopin, may continue Lexapro for now as with SSRI benefit outweighs risk on review -- discussed with her OB may change to alternate SSRI in future.  Take Zofran as needed for nausea.  Referral to GYN placed.  Start prenatal vitamin.      Relevant Orders   Pregnancy, urine (Completed)   Beta hCG quant (ref lab)   Ambulatory referral to Obstetrics / Gynecology     Follow up plan: Return if symptoms worsen or fail to improve.

## 2022-07-24 LAB — BETA HCG QUANT (REF LAB): hCG Quant: 31518 m[IU]/mL

## 2022-07-24 NOTE — Progress Notes (Signed)
Contacted via [place] afternoon Anaira, based on labs I am going to guess you are around 12 weeks as this goes with your last cycle too.  However, ultrasound with OB provider will tell us more.  Again major congratulations to you!!!!! Keep being amazing!!  Thank you for allowing me to participate in your care.  I appreciate you. Kindest regards, Adhya Cocco

## 2022-07-31 ENCOUNTER — Encounter: Payer: Self-pay | Admitting: Nurse Practitioner

## 2022-08-01 ENCOUNTER — Telehealth: Payer: Self-pay | Admitting: *Deleted

## 2022-08-01 ENCOUNTER — Encounter: Payer: Self-pay | Admitting: Physician Assistant

## 2022-08-01 ENCOUNTER — Telehealth (INDEPENDENT_AMBULATORY_CARE_PROVIDER_SITE_OTHER): Payer: 59 | Admitting: Physician Assistant

## 2022-08-01 VITALS — Temp 98.5°F

## 2022-08-01 DIAGNOSIS — J069 Acute upper respiratory infection, unspecified: Secondary | ICD-10-CM

## 2022-08-01 LAB — VERITOR FLU A/B WAIVED
Influenza A: NEGATIVE
Influenza B: NEGATIVE

## 2022-08-01 NOTE — Patient Instructions (Addendum)
You can use Mucinex (guaifenesin) and the recommended medications that Jolene mentioned to help with your symptoms   You can use Dextromethorphan (Robitussin) as needed to help with cough

## 2022-08-01 NOTE — Progress Notes (Signed)
Virtual Visit via Video Note  I connected with Alexa Diaz on 08/01/22 at 10:40 AM EDT by a video enabled telemedicine application and verified that I am speaking with the correct person using two identifiers.  Today's Provider: Talitha Diaz, MHS, PA-C Introduced myself to the patient as a PA-C and provided education on APPs in clinical practice.    Location: Patient: Alexa Diaz, Alexa Diaz  Provider: [facility], Alexa Diaz, Alexa Diaz    I discussed the limitations of evaluation and management by telemedicine and the availability of in person appointments. The patient expressed understanding and agreed to proceed.   Chief Complaint  Patient presents with   Cough    Fever, body aches, sore throat. Started on Tuesday. Patient is currently pregnant is only taking Tylenol  for fever. Daughter had bronchitis last week.       History of Present Illness:   States this started Tues with dry cough She noticed it started getting worse Wed  She is having cold chills, fever, cough, body aches Denies nasal congestion  She is pregnant - reports some SOB from anxiety as she stopped taking her Lexapro last week due to pregnancy Reports fever this AM was 99  She has been taking Tylenol to help with fever  She is concerned for bronchitis as her daughter was diagnosed with this last week    Review of Systems  Constitutional:  Positive for fever.  HENT:  Positive for sore throat.   Respiratory:  Positive for cough. Negative for shortness of breath and wheezing.   Gastrointestinal:  Positive for nausea and vomiting. Negative for diarrhea.  Musculoskeletal:  Positive for myalgias.  Neurological:  Negative for dizziness and headaches.      Observations/Objective: Due to the nature of the virtual visit, physical exam and observations are limited. Able to obtain the following observations:   Alert, oriented, Appears comfortable, in no acute distress.  No scleral injection, no appreciated  hoarseness, tachypnea, wheeze or strider. Able to maintain conversation without visible strain.   cough appreciated during visit.    Assessment and Plan:   Problem List Items Addressed This Visit   None Visit Diagnoses     Upper respiratory tract infection, unspecified type    -  Primary Acute, new concern Patient reports coughing, low fevers, body aches, since Tuesday She has been taking Tylenol only due to pregnancy  Will test for COVID and flu- Flu negative today Recommend symptomatic management - reviewed Up to Date medication information for pregnant individuals and she is able to take Guaifenesin and Dextromethorphan as needed for cough and mucus. She should continue tylenol and use Antihistamine per recommendations from Alexa Guarneri, NP Follow up as needed for persistent or progressing symptoms.     Relevant Orders   Influenza a and b   Novel Coronavirus, NAA ([facility])      Follow Up Instructions:    I discussed the assessment and treatment plan with the patient. The patient was provided an opportunity to ask questions and all were answered. The patient agreed with the plan and demonstrated an understanding of the instructions.   The patient was advised to call back or seek an in-person evaluation if the symptoms worsen or if the condition fails to improve as anticipated.  I provided 17 minutes of non-face-to-face time during this encounter.  No follow-ups on file.   I, Alexa Spooner E Remington Skalsky, PA-C, have reviewed all documentation for this visit. The documentation on 08/01/22 for the exam, diagnosis, procedures, and  orders are all accurate and complete.   Alexa Diaz, MHS, PA-C [facility]

## 2022-08-01 NOTE — Telephone Encounter (Signed)
Patient calling for test results and recommendations. Advised - Negative for flu. We will keep you updated with the results of the COVID testing as they are available.   As far as OTC medication- less is better- cough drops, honey at night, increased fluids,Tylenol-limited- always check with OB for safe OTC medication list- and can also check with pharmacy to see waht safest choice is for chest congestion.

## 2022-08-02 ENCOUNTER — Encounter: Payer: Self-pay | Admitting: Physician Assistant

## 2022-08-02 LAB — NOVEL CORONAVIRUS, NAA: SARS-CoV-2, NAA: NOT DETECTED

## 2022-08-15 ENCOUNTER — Ambulatory Visit: Payer: 59 | Admitting: Physician Assistant

## 2022-08-15 ENCOUNTER — Ambulatory Visit: Payer: Self-pay | Admitting: *Deleted

## 2022-08-15 NOTE — Telephone Encounter (Signed)
  Chief Complaint: spotting Symptoms: first trimester Frequency: spotted one time this morning Pertinent Negatives: Patient denies cramps, abd hardening, feeling bad Disposition: [] ED /[] Urgent Care (no appt availability in office) / [x] Appointment(In office/virtual)/ []  [place] [facility]/ [] Home Care/ [] Refused Recommended Disposition /[] [facility]/ []  Follow-up with PCP Additional Notes: Appt in office today. Pt encouraged to leave work and lie down and rest until appt. Unclear if she will follow advice.  Reason for Disposition  MILD vaginal bleeding (i.e., less than 1 pad / hour; less than patient's usual menstrual bleeding; not just spotting)  Answer Assessment - Initial Assessment Questions 1. ONSET: "When did this bleeding start?"       This morning 2. DESCRIPTION: "Describe the bleeding that you are having." "How much bleeding is there?"    - SPOTTING: spotting, or pinkish / brownish mucous discharge; does not fill panty liner or pad    - MILD:  less than 1 pad / hour; less than patient's usual menstrual bleeding   - MODERATE: 1-2 pads / hour; 1 menstrual cup every 6 hours; small-medium blood clots (e.g., pea, grape, small coin)   - SEVERE: soaking 2 or more pads/hour for 2 or more hours; 1 menstrual cup every 2 hours; bleeding not contained by pads or continuous red blood from vagina; large blood clots (e.g., golf ball, large coin)      Pinkish brownish, minimal 3. ABDOMINAL PAIN SEVERITY: If present, ask: "How bad is it?"  (e.g., Scale 1-10; mild, moderate, or severe)   - MILD (1-3): doesn't interfere with normal activities, abdomen soft and not tender to touch    - MODERATE (4-7): interferes with normal activities or awakens from sleep, abdomen tender to touch    - SEVERE (8-10): excruciating pain, doubled over, unable to do any normal activities     Soft, not tender 4. PREGNANCY: "Do you know how many weeks or months pregnant you are?" "When was the first day of  your last normal menstrual period?"     12-16 not sure 5. HEMODYNAMIC STATUS: "Are you weak or feeling lightheaded?" If Yes, ask: "Can you stand and walk normally?"      no 6. OTHER SYMPTOMS: "What other symptoms are you having with the bleeding?" (e.g., passed tissue, vaginal discharge, fever, menstrual-type cramps)     no  Protocols used: Pregnancy - Vaginal Bleeding Less Than [redacted] Weeks EGA-A-AH

## 2022-08-18 ENCOUNTER — Telehealth: Payer: Self-pay

## 2022-08-18 NOTE — Telephone Encounter (Signed)
Transition Care Management Follow-up Telephone Call Date of discharge and from where: 08/15/22, [facility]) How have you been since you were released from the hospital? "Ok, just confused about what is going on." Any questions or concerns? No  Items Reviewed: Did the pt receive and understand the discharge instructions provided? Yes  Medications obtained and verified? Yes  Other? No  Any new allergies since your discharge? No  Dietary orders reviewed? Yes Do you have support at home? Yes   Home Care and Equipment/Supplies: Were home health services ordered? not applicable If so, what is the name of the agency? N/A  Has the agency set up a time to come to the patient's home? not applicable Were any new equipment or medical supplies ordered?  No What is the name of the medical supply agency? N/A Were you able to get the supplies/equipment? not applicable Do you have any questions related to the use of the equipment or supplies? No  Functional Questionnaire: (I = Independent and D = Dependent) ADLs: I  Bathing/Dressing- I  Meal Prep- I  Eating- I  Maintaining continence- I  Transferring/Ambulation- I  Managing Meds- I  Follow up appointments reviewed:  PCP Hospital f/u appt confirmed? No .  [facility] f/u appt confirmed? Yes  Scheduled to see OBGYN on 08/22/22 @ 3:00 PM. Are transportation arrangements needed? No  If their condition worsens, is the pt aware to call PCP or go to the Emergency Dept.? Yes Was the patient provided with contact information for the PCP's office or ED? Yes Was to pt encouraged to call back with questions or concerns? Yes

## 2022-08-21 ENCOUNTER — Other Ambulatory Visit
Admission: RE | Admit: 2022-08-21 | Discharge: 2022-08-21 | Disposition: A | Discharge status: Home / Self Care | Payer: 59 | Source: Ambulatory Visit | Attending: Obstetrics | Admitting: Obstetrics

## 2022-08-21 ENCOUNTER — Other Ambulatory Visit: Payer: Self-pay | Admitting: Obstetrics and Gynecology

## 2022-08-21 DIAGNOSIS — O3680X0 Pregnancy with inconclusive fetal viability, not applicable or unspecified: Secondary | ICD-10-CM | POA: Diagnosis present

## 2022-08-21 DIAGNOSIS — O039 Complete or unspecified spontaneous abortion without complication: Secondary | ICD-10-CM

## 2022-08-21 LAB — HCG, QUANTITATIVE, PREGNANCY: hCG, Beta Chain, Quant, S: 12761 m[IU]/mL — ABNORMAL HIGH (ref <5)

## 2022-09-16 NOTE — H&P (Signed)
Alexa Diaz is a 31 y.o. female here for No chief complaint on file. Marland Kitchen Unsure LMP  Pt here for follow up for a missed ab managed  by Wendall Papa . She is 7+5 weeks by dates and u/s on 11/10 showed 6 week demise . Repeat u/s post home cytotec 4 days ago still shows sac and fetal pole . Bleeding small now . She desires a suction D+C  MBT by history O+ Past Medical History:  has a past medical history of Anxiety, BMI 40.0-44.9, adult (CMS-HCC), HSV-1 infection, HSV-2 infection, PTSD (post-traumatic stress disorder), Reduction defect of left lower extremity, and Scoliosis.  Past Surgical History:  has a past surgical history that includes wisdom teeth. Family History: family history includes Alcohol abuse in her father; Arthritis in her father; Cancer in her father; High blood pressure (Hypertension) in her father; Thyroid disease in her brother. Social History:  reports that she has quit smoking. Her smoking use included cigarettes. She has never used smokeless tobacco. She reports that she does not currently use alcohol. She reports that she does not use drugs. OB/GYN History:  OB History       Gravida  2   Para  1   Term  1   Preterm      AB      Living  1        SAB      IAB      Ectopic      Molar      Multiple      Live Births  1             Allergies: is allergic to penicillins, sertraline hcl, and bupropion. Medications:   Current Outpatient Medications:    escitalopram oxalate (LEXAPRO) 20 MG tablet, Take 20 mg by mouth once daily (Patient not taking: Reported on 08/21/2022), Disp: , Rfl:    oxyCODONE-acetaminophen (PERCOCET) 5-325 mg tablet, Take 1 tablet by mouth every 6 (six) hours as needed for Pain for up to 4 doses, Disp: 4 tablet, Rfl: 0   prenatal vitamin with iron-folic acid (PRENATAL TABLETS) tablet, Take 1 tablet by mouth once daily, Disp: , Rfl:    Review of Systems: General:                      No fatigue or weight loss Eyes:                            No vision changes Ears:                            No hearing difficulty Respiratory:                No cough or shortness of breath Pulmonary:                  No asthma or shortness of breath Cardiovascular:           No chest pain, palpitations, dyspnea on exertion Gastrointestinal:          No abdominal bloating, chronic diarrhea, constipations, masses, pain or hematochezia Genitourinary:             No hematuria, dysuria, abnormal vaginal discharge, pelvic pain, Menometrorrhagia, + vaginal bleeding  Lymphatic:                   No swollen  lymph nodes Musculoskeletal:         No muscle weakness Neurologic:                  No extremity weakness, syncope, seizure disorder Psychiatric:                  No history of depression, delusions or suicidal/homicidal ideation      Exam:    There were no vitals filed for this visit.   There is no height or weight on file to calculate BMI. BP 128/83 p= 73 WDWN white/  female in NAD   Lungs: CTA  CV : RRR without murmur   Neck:  no thyromegaly Abdomen: soft , no mass, normal active bowel sounds,  non-tender, no rebound tenderness Pelvic: pelvic deferred    Impression:    The encounter diagnosis was Missed ab.       Plan:  Spoke to her about management of Missed Ab . She wishes to proceed  Benefits and risks to surgery:suction dilation and curettage  The proposed benefit of the surgery has been discussed with the patient. The possible risks include, but are not limited to: organ injury to the bowel , bladder, ureters, and major blood vessels and nerves. There is a possibility of additional surgeries resulting from these injuries. There is also the risk of blood transfusion and the need to receive blood products during or after the procedure which may rarely lead to HIV or Hepatitis C infection. There is a risk of developing a deep venous thrombosis or a pulmonary embolism . There is the possibility of wound infection and also  anesthetic complications, even the rare possibility of death. The patient understands these risks and wishes to proceed. All questions have been answered and the consent has been signed.  50 minutes in patient care        No follow-ups on file.   Vilma Prader, MD            Electronically signed by Vilma Prader, MD on 09/16/2022  3:17 PM       Office Visit on 09/16/2022      Note shared with patient

## 2022-09-17 ENCOUNTER — Encounter
Admission: RE | Disposition: A | Discharge status: Home / Self Care | Payer: Self-pay | Source: Home / Self Care | Attending: Obstetrics and Gynecology

## 2022-09-17 ENCOUNTER — Ambulatory Visit
Admission: RE | Admit: 2022-09-17 | Discharge: 2022-09-17 | Disposition: A | Discharge status: Home / Self Care | Payer: 59 | Attending: Obstetrics and Gynecology | Admitting: Obstetrics and Gynecology

## 2022-09-17 ENCOUNTER — Ambulatory Visit: Payer: 59 | Admitting: Anesthesiology

## 2022-09-17 ENCOUNTER — Other Ambulatory Visit: Payer: Self-pay

## 2022-09-17 ENCOUNTER — Encounter: Payer: Self-pay | Admitting: Obstetrics and Gynecology

## 2022-09-17 DIAGNOSIS — O021 Missed abortion: Secondary | ICD-10-CM | POA: Insufficient documentation

## 2022-09-17 DIAGNOSIS — F419 Anxiety disorder, unspecified: Secondary | ICD-10-CM | POA: Diagnosis not present

## 2022-09-17 DIAGNOSIS — Z87891 Personal history of nicotine dependence: Secondary | ICD-10-CM | POA: Insufficient documentation

## 2022-09-17 DIAGNOSIS — Z01818 Encounter for other preprocedural examination: Secondary | ICD-10-CM

## 2022-09-17 DIAGNOSIS — M1990 Unspecified osteoarthritis, unspecified site: Secondary | ICD-10-CM | POA: Insufficient documentation

## 2022-09-17 DIAGNOSIS — Z79899 Other long term (current) drug therapy: Secondary | ICD-10-CM | POA: Insufficient documentation

## 2022-09-17 DIAGNOSIS — Z6841 Body Mass Index (BMI) 40.0 and over, adult: Secondary | ICD-10-CM | POA: Diagnosis not present

## 2022-09-17 DIAGNOSIS — O039 Complete or unspecified spontaneous abortion without complication: Secondary | ICD-10-CM

## 2022-09-17 HISTORY — PX: DILATION AND EVACUATION: SHX1459

## 2022-09-17 LAB — TYPE AND SCREEN
ABO/RH(D): O POS
Antibody Screen: NEGATIVE

## 2022-09-17 LAB — BASIC METABOLIC PANEL
Anion gap: 6 (ref 5–15)
BUN: 14 mg/dL (ref 6–20)
CO2: 25 mmol/L (ref 22–32)
Calcium: 8.8 mg/dL — ABNORMAL LOW (ref 8.9–10.3)
Chloride: 109 mmol/L (ref 98–111)
Creatinine, Ser: 0.61 mg/dL (ref 0.44–1.00)
GFR, Estimated: >60 mL/min (ref >60)
Glucose, Bld: 95 mg/dL (ref 70–99)
Potassium: 4.3 mmol/L (ref 3.5–5.1)
Sodium: 140 mmol/L (ref 135–145)

## 2022-09-17 LAB — CBC
HCT: 35 % — ABNORMAL LOW (ref 36.0–46.0)
Hemoglobin: 11.3 g/dL — ABNORMAL LOW (ref 12.0–15.0)
MCH: 28.1 pg (ref 26.0–34.0)
MCHC: 32.3 g/dL (ref 30.0–36.0)
MCV: 87.1 fL (ref 80.0–100.0)
Platelets: 197 10*3/uL (ref 150–400)
RBC: 4.02 MIL/uL (ref 3.87–5.11)
RDW: 13.4 % (ref 11.5–15.5)
WBC: 6.3 10*3/uL (ref 4.0–10.5)
nRBC: 0 % (ref 0.0–0.2)

## 2022-09-17 LAB — HCG, QUANTITATIVE, PREGNANCY: hCG, Beta Chain, Quant, S: 223 m[IU]/mL — ABNORMAL HIGH (ref <5)

## 2022-09-17 SURGERY — DILATION AND EVACUATION, UTERUS
Anesthesia: General

## 2022-09-17 MED ORDER — METHYLERGONOVINE MALEATE 0.2 MG/ML IJ SOLN
INTRAMUSCULAR | Status: DC | PRN
  Administered 2022-09-17: .2 mg via INTRAMUSCULAR

## 2022-09-17 MED ORDER — METHYLERGONOVINE MALEATE 0.2 MG/ML IJ SOLN
INTRAMUSCULAR | Status: AC
  Filled 2022-09-17: qty 1

## 2022-09-17 MED ORDER — SILVER NITRATE-POT NITRATE 75-25 % EX MISC
CUTANEOUS | Status: AC
  Filled 2022-09-17: qty 10

## 2022-09-17 MED ORDER — ACETAMINOPHEN 500 MG PO TABS: 1000.0000 mg | ORAL_TABLET | ORAL | Status: AC

## 2022-09-17 MED ORDER — FENTANYL CITRATE (PF) 100 MCG/2ML IJ SOLN
INTRAMUSCULAR | Status: AC
  Filled 2022-09-17: qty 2

## 2022-09-17 MED ORDER — HYDROMORPHONE HCL 1 MG/ML IJ SOLN: 0.2500 mg | INTRAMUSCULAR | Status: DC | PRN

## 2022-09-17 MED ORDER — CHLORHEXIDINE GLUCONATE 0.12 % MT SOLN
OROMUCOSAL | Status: AC
  Administered 2022-09-17: 15 mL via OROMUCOSAL
  Filled 2022-09-17: qty 15

## 2022-09-17 MED ORDER — EPHEDRINE 5 MG/ML INJ
INTRAVENOUS | Status: AC
  Filled 2022-09-17: qty 5

## 2022-09-17 MED ORDER — PROPOFOL 10 MG/ML IV BOLUS
INTRAVENOUS | Status: DC | PRN
  Administered 2022-09-17: 200 mg via INTRAVENOUS

## 2022-09-17 MED ORDER — GABAPENTIN 300 MG PO CAPS: 300.0000 mg | ORAL_CAPSULE | ORAL | Status: AC

## 2022-09-17 MED ORDER — LIDOCAINE HCL (PF) 2 % IJ SOLN
INTRAMUSCULAR | Status: AC
  Filled 2022-09-17: qty 5

## 2022-09-17 MED ORDER — FENTANYL CITRATE (PF) 100 MCG/2ML IJ SOLN
INTRAMUSCULAR | Status: DC | PRN
  Administered 2022-09-17 (×4): 25 ug via INTRAVENOUS

## 2022-09-17 MED ORDER — LACTATED RINGERS IV SOLN: INTRAVENOUS | Status: DC

## 2022-09-17 MED ORDER — DEXAMETHASONE SODIUM PHOSPHATE 10 MG/ML IJ SOLN
INTRAMUSCULAR | Status: DC | PRN
  Administered 2022-09-17: 10 mg via INTRAVENOUS

## 2022-09-17 MED ORDER — CHLORHEXIDINE GLUCONATE 0.12 % MT SOLN: 15.0000 mL | Freq: Once | OROMUCOSAL | Status: AC

## 2022-09-17 MED ORDER — EPHEDRINE SULFATE (PRESSORS) 50 MG/ML IJ SOLN
INTRAMUSCULAR | Status: DC | PRN
  Administered 2022-09-17: 5 mg via INTRAVENOUS

## 2022-09-17 MED ORDER — KETOROLAC TROMETHAMINE 30 MG/ML IJ SOLN
INTRAMUSCULAR | Status: DC | PRN
  Administered 2022-09-17: 30 mg via INTRAVENOUS

## 2022-09-17 MED ORDER — PROPOFOL 10 MG/ML IV BOLUS
INTRAVENOUS | Status: AC
  Filled 2022-09-17: qty 20

## 2022-09-17 MED ORDER — OXYCODONE HCL 5 MG PO TABS
ORAL_TABLET | ORAL | Status: AC
  Filled 2022-09-17: qty 1

## 2022-09-17 MED ORDER — OXYCODONE HCL 5 MG PO TABS
5.0000 mg | ORAL_TABLET | Freq: Once | ORAL | Status: AC | PRN
  Administered 2022-09-17: 5 mg via ORAL

## 2022-09-17 MED ORDER — 0.9 % SODIUM CHLORIDE (POUR BTL) OPTIME
TOPICAL | Status: DC | PRN
  Administered 2022-09-17: 500 mL

## 2022-09-17 MED ORDER — DOXYCYCLINE HYCLATE 100 MG IV SOLR
200.0000 mg | INTRAVENOUS | Status: AC
  Administered 2022-09-17: 200 mg via INTRAVENOUS
  Filled 2022-09-17: qty 200

## 2022-09-17 MED ORDER — ORAL CARE MOUTH RINSE: 15.0000 mL | Freq: Once | OROMUCOSAL | Status: AC

## 2022-09-17 MED ORDER — MIDAZOLAM HCL 2 MG/2ML IJ SOLN
INTRAMUSCULAR | Status: DC | PRN
  Administered 2022-09-17: 2 mg via INTRAVENOUS

## 2022-09-17 MED ORDER — DEXAMETHASONE SODIUM PHOSPHATE 10 MG/ML IJ SOLN
INTRAMUSCULAR | Status: AC
  Filled 2022-09-17: qty 1

## 2022-09-17 MED ORDER — ONDANSETRON HCL 4 MG/2ML IJ SOLN
INTRAMUSCULAR | Status: DC | PRN
  Administered 2022-09-17: 4 mg via INTRAVENOUS

## 2022-09-17 MED ORDER — ACETAMINOPHEN 500 MG PO TABS
ORAL_TABLET | ORAL | Status: AC
  Administered 2022-09-17: 1000 mg via ORAL
  Filled 2022-09-17: qty 2

## 2022-09-17 MED ORDER — LIDOCAINE HCL (CARDIAC) PF 100 MG/5ML IV SOSY
PREFILLED_SYRINGE | INTRAVENOUS | Status: DC | PRN
  Administered 2022-09-17: 80 mg via INTRAVENOUS

## 2022-09-17 MED ORDER — KETOROLAC TROMETHAMINE 30 MG/ML IJ SOLN
INTRAMUSCULAR | Status: AC
  Filled 2022-09-17: qty 1

## 2022-09-17 MED ORDER — MIDAZOLAM HCL 2 MG/2ML IJ SOLN
INTRAMUSCULAR | Status: AC
  Filled 2022-09-17: qty 2

## 2022-09-17 MED ORDER — GABAPENTIN 300 MG PO CAPS
ORAL_CAPSULE | ORAL | Status: AC
  Administered 2022-09-17: 300 mg via ORAL
  Filled 2022-09-17: qty 1

## 2022-09-17 MED ORDER — OXYCODONE HCL 5 MG/5ML PO SOLN: 5.0000 mg | Freq: Once | ORAL | Status: AC | PRN

## 2022-09-17 MED ORDER — ONDANSETRON HCL 4 MG/2ML IJ SOLN
INTRAMUSCULAR | Status: AC
  Filled 2022-09-17: qty 2

## 2022-09-17 MED ORDER — POVIDONE-IODINE 10 % EX SWAB
2.0000 | Freq: Once | CUTANEOUS | Status: AC
  Administered 2022-09-17: 2 via TOPICAL

## 2022-09-17 SURGICAL SUPPLY — 28 items
DRSG TELFA 3X8 NADH STRL (GAUZE/BANDAGES/DRESSINGS) IMPLANT
FILTER UTR ASPR ASSEMBLY (MISCELLANEOUS) ×1 IMPLANT
FILTER UTR ASPR SPEC (MISCELLANEOUS) ×1 IMPLANT
FLTR UTR ASPR SPEC (MISCELLANEOUS) ×1
GLOVE SURG SYN 8.0 (GLOVE) ×3 IMPLANT
GLOVE SURG SYN 8.0 PF PI (GLOVE) ×1 IMPLANT
GOWN STRL REUS W/ TWL LRG LVL3 (GOWN DISPOSABLE) ×1 IMPLANT
GOWN STRL REUS W/ TWL XL LVL3 (GOWN DISPOSABLE) ×1 IMPLANT
GOWN STRL REUS W/TWL LRG LVL3 (GOWN DISPOSABLE) ×1
GOWN STRL REUS W/TWL XL LVL3 (GOWN DISPOSABLE) ×1
KIT BERKELEY 1ST TRIMESTER 3/8 (MISCELLANEOUS) ×1 IMPLANT
KIT TURNOVER CYSTO (KITS) ×1 IMPLANT
MANIFOLD NEPTUNE II (INSTRUMENTS) ×1 IMPLANT
PACK DNC HYST (MISCELLANEOUS) ×1 IMPLANT
PAD OB MATERNITY 4.3X12.25 (PERSONAL CARE ITEMS) ×1 IMPLANT
PAD PREP 24X41 OB/GYN DISP (PERSONAL CARE ITEMS) ×1 IMPLANT
SCRUB CHG 4% DYNA-HEX 4OZ (MISCELLANEOUS) ×1 IMPLANT
SET BERKELEY SUCTION TUBING (SUCTIONS) ×1 IMPLANT
SET CYSTO W/LG BORE CLAMP LF (SET/KITS/TRAYS/PACK) IMPLANT
TOWEL OR 17X26 4PK STRL BLUE (TOWEL DISPOSABLE) ×1 IMPLANT
TRAP FLUID SMOKE EVACUATOR (MISCELLANEOUS) ×1 IMPLANT
TRAP TISSUE FILTER (MISCELLANEOUS) ×1 IMPLANT
VACURETTE 10 RIGID CVD (CANNULA) ×1 IMPLANT
VACURETTE 6 ASPIR F TIP BERK (CANNULA) ×1 IMPLANT
VACURETTE 7MM F TIP STRL (CANNULA) ×1 IMPLANT
VACURETTE 8 RIGID CVD (CANNULA) ×1 IMPLANT
VACURETTE 8MM F TIP (MISCELLANEOUS) ×1 IMPLANT
WATER STERILE IRR 500ML POUR (IV SOLUTION) ×1 IMPLANT

## 2022-09-17 NOTE — Anesthesia Procedure Notes (Signed)
Procedure Name: LMA Insertion Date/Time: 09/17/2022 12:25 PM  Performed by: Karoline Caldwell, CRNAPre-anesthesia Checklist: Patient identified, Patient being monitored, Timeout performed, Emergency Drugs available and Suction available Patient Re-evaluated:Patient Re-evaluated prior to induction Oxygen Delivery Method: Circle system utilized Preoxygenation: Pre-oxygenation with 100% oxygen Induction Type: IV induction Ventilation: Mask ventilation without difficulty LMA: LMA inserted LMA Size: 4.0 Tube type: Oral Number of attempts: 1 Placement Confirmation: positive ETCO2 and breath sounds checked- equal and bilateral Tube secured with: Tape Dental Injury: Teeth and Oropharynx as per pre-operative assessment

## 2022-09-17 NOTE — Transfer of Care (Signed)
Immediate Anesthesia Transfer of Care Note  Patient: Alexa Diaz  Procedure(s) Performed: SUCTION DILATATION AND CURETTAGE  Patient Location: PACU  Anesthesia Type:General  Level of Consciousness: awake, alert , and oriented  Airway & Oxygen Therapy: Patient Spontanous Breathing  Post-op Assessment: Report given to RN and Post -op Vital signs reviewed and stable  Post vital signs: reviewed and stable Last Vitals:  Vitals Value Taken Time  BP 132/78 09/17/22 1300  Temp 36.2 C 09/17/22 1300  Pulse 54 09/17/22 1304  Resp 13 09/17/22 1304  SpO2 99 % 09/17/22 1304  Vitals shown include unvalidated device data.  Last Pain:  Vitals:   09/17/22 1300  TempSrc:   PainSc: 0-No pain         Complications: No notable events documented.

## 2022-09-17 NOTE — Discharge Instructions (Signed)

## 2022-09-17 NOTE — Op Note (Signed)
Alexa, Diaz MEDICAL RECORD NO: [id number] ACCOUNT NO: [id number] DATE OF BIRTH: 10/25/1991 FACILITY: [facility] LOCATION: [facility] PHYSICIAN: Suzy Bouchard, MD  Operative Report   DATE OF PROCEDURE: 09/17/2022  PREOPERATIVE DIAGNOSIS:  Missed abortion.  POSTOPERATIVE DIAGNOSIS:  Missed abortion.  PROCEDURE:  Suction dilation and curettage.  SURGEON:  Suzy Bouchard, MD  ANESTHESIA:  General endotracheal anesthesia.  INDICATIONS:  A 31 year old gravida 2, para 1, patient at 7+6 weeks estimated gestational age with a documented intrauterine fetal demise at 6 weeks documented on 2 separate ultrasounds.  The patient has elected for surgical intervention after failing  home treatment with Cytotec.  DESCRIPTION OF PROCEDURE:  After adequate general endotracheal anesthesia, the patient was placed in dorsal supine position. The patient's legs were placed in the candy cane stirrups.  The patient's abdomen, perineum and vagina were prepped and draped in  normal sterile fashion.  The patient did receive 200 mg intravenous doxycycline for surgical prophylaxis.  Timeout was performed.  Straight catheterization of the bladder yielded 50 mL clear urine.  Weighted speculum was placed in the posterior vaginal  vault and the anterior cervix was grasped with single tooth tenaculum.  Cervix was dilated to #20 Hanks dilator without difficulty.  A flexible 8 suction curette was then placed in the endometrial cavity and products of conception were removed.  There  was some brisk bleeding that ultimately was controlled with bimanual compression.  Sharp curettage was performed with no additional tissue and a repeat suction curettage was performed with no additional tissue and bimanual compression was again performed  to control bleeding.  The patient did receive 0.2 mg intramuscular Methergine during the procedure.  Good hemostasis at the end of the case.  There were no  complications.  ESTIMATED BLOOD LOSS:  400 mL.  INTRAOPERATIVE FLUIDS:  900 mL.  URINE OUTPUT:  50 mL.  The patient was taken to recovery room in good condition.   PUS D: 09/17/2022 12:58:01 pm T: 09/17/2022 2:02:00 pm  JOB: [id number]/ [id number]

## 2022-09-17 NOTE — Progress Notes (Signed)
Pt here for suction D+C . All questions answered . Labs reviewed . Proceed

## 2022-09-17 NOTE — Brief Op Note (Signed)
09/17/2022  12:49 PM  PATIENT:  Alexa Diaz  31 y.o. female  PRE-OPERATIVE DIAGNOSIS:  missed abortion  POST-OPERATIVE DIAGNOSIS:  missed abortion  PROCEDURE:  Procedure(s): SUCTION DILATATION AND CURETTAGE (N/A)  SURGEON:  Surgeon(s) and Role:    * Velita Quirk, Ihor Austin, MD - Primary  PHYSICIAN ASSISTANT:   ASSISTANTS: none   ANESTHESIA:   general  EBL:  400 mL IOF 900 uo 50 cc  BLOOD ADMINISTERED:none  DRAINS: none   LOCAL MEDICATIONS USED:  OTHER 0.2 mg Methergine IM  SPECIMEN:  Source of Specimen:  POC  DISPOSITION OF SPECIMEN:  PATHOLOGY  COUNTS:  YES  TOURNIQUET:  * No tourniquets in log *  DICTATION: .Other Dictation: Dictation Number verbal  PLAN OF CARE: Discharge to home after PACU  PATIENT DISPOSITION:  PACU - hemodynamically stable.   Delay start of Pharmacological VTE agent (>24hrs) due to surgical blood loss or risk of bleeding: not applicable

## 2022-09-17 NOTE — Anesthesia Preprocedure Evaluation (Addendum)
Anesthesia Evaluation  Patient identified by MRN, date of birth, ID band Patient awake    Reviewed: Allergy & Precautions, NPO status , Patient's Chart, lab work & pertinent test results  History of Anesthesia Complications Negative for: history of anesthetic complications  Airway Mallampati: II  TM Distance: >3 FB Neck ROM: full    Dental  (+) Teeth Intact   Pulmonary Patient abstained from smoking., former smoker   Pulmonary exam normal        Cardiovascular negative cardio ROS Normal cardiovascular exam     Neuro/Psych  PSYCHIATRIC DISORDERS Anxiety     negative neurological ROS     GI/Hepatic negative GI ROS, Neg liver ROS,,,  Endo/Other    Morbid obesity  Renal/GU      Musculoskeletal   Abdominal   Peds  Hematology negative hematology ROS (+)   Anesthesia Other Findings Past Medical History: No date: Allergic rhinitis No date: Back pain     Comment:  3 bulging discs, arthritis and scoliosis 9/15 No date: Chronic post-traumatic stress disorder (PTSD) No date: Herpes simplex virus infection     Comment:  1 and 2 No date: Reduction defect of left lower extremity     Comment:  Short L leg- wears lift  Past Surgical History: No date: NO PAST SURGERIES  BMI    Body Mass Index: 43.27 kg/m      Reproductive/Obstetrics negative OB ROS                             Anesthesia Physical Anesthesia Plan  ASA: 3  Anesthesia Plan: General LMA   Post-op Pain Management: Minimal or no pain anticipated   Induction: Intravenous  PONV Risk Score and Plan: 3 and Dexamethasone, Ondansetron, Midazolam and Treatment may vary due to age or medical condition  Airway Management Planned: LMA  Additional Equipment:   Intra-op Plan:   Post-operative Plan: Extubation in OR  Informed Consent: I have reviewed the patients History and Physical, chart, labs and discussed the procedure  including the risks, benefits and alternatives for the proposed anesthesia with the patient or authorized representative who has indicated his/her understanding and acceptance.     Dental Advisory Given  Plan Discussed with: Anesthesiologist, CRNA and Surgeon  Anesthesia Plan Comments: (Patient consented for risks of anesthesia including but not limited to:  - adverse reactions to medications - damage to eyes, teeth, lips or other oral mucosa - nerve damage due to positioning  - sore throat or hoarseness - Damage to heart, brain, nerves, lungs, other parts of body or loss of life  Patient voiced understanding.)       Anesthesia Quick Evaluation

## 2022-09-18 NOTE — Anesthesia Postprocedure Evaluation (Signed)
Anesthesia Post Note  Patient: Alexa Diaz  Procedure(s) Performed: SUCTION DILATATION AND CURETTAGE  Patient location during evaluation: PACU Anesthesia Type: General Level of consciousness: awake and alert Pain management: pain level controlled Vital Signs Assessment: post-procedure vital signs reviewed and stable Respiratory status: spontaneous breathing, nonlabored ventilation, respiratory function stable and patient connected to nasal cannula oxygen Cardiovascular status: blood pressure returned to baseline and stable Postop Assessment: no apparent nausea or vomiting Anesthetic complications: no   No notable events documented.   Last Vitals:  Vitals:   09/17/22 1345 09/17/22 1405  BP: 107/67 132/86  Pulse: (!) 48 (!) 49  Resp: 17 16  Temp: (!) 36.4 C (!) 36.1 C  SpO2: 98%     Last Pain:  Vitals:   09/17/22 1405  TempSrc: Temporal  PainSc: 0-No pain                 Louie Boston

## 2022-09-19 ENCOUNTER — Encounter: Payer: Self-pay | Admitting: Obstetrics and Gynecology

## 2022-09-19 LAB — SURGICAL PATHOLOGY

## 2022-10-13 ENCOUNTER — Other Ambulatory Visit: Payer: Self-pay | Admitting: Obstetrics and Gynecology

## 2022-10-13 ENCOUNTER — Encounter: Payer: Self-pay | Admitting: Obstetrics and Gynecology

## 2022-10-13 DIAGNOSIS — O034 Incomplete spontaneous abortion without complication: Secondary | ICD-10-CM

## 2022-10-13 DIAGNOSIS — R102 Pelvic and perineal pain: Secondary | ICD-10-CM

## 2022-10-21 ENCOUNTER — Ambulatory Visit: Payer: 59

## 2022-10-24 ENCOUNTER — Ambulatory Visit: Payer: Self-pay

## 2022-11-09 ENCOUNTER — Encounter: Payer: Self-pay | Admitting: Family Medicine

## 2022-11-10 ENCOUNTER — Encounter: Payer: Self-pay | Admitting: Nurse Practitioner

## 2022-11-10 ENCOUNTER — Telehealth (INDEPENDENT_AMBULATORY_CARE_PROVIDER_SITE_OTHER): Payer: 59 | Admitting: Nurse Practitioner

## 2022-11-10 DIAGNOSIS — J029 Acute pharyngitis, unspecified: Secondary | ICD-10-CM | POA: Diagnosis not present

## 2022-11-10 DIAGNOSIS — J069 Acute upper respiratory infection, unspecified: Secondary | ICD-10-CM

## 2022-11-10 DIAGNOSIS — R0981 Nasal congestion: Secondary | ICD-10-CM

## 2022-11-10 NOTE — Progress Notes (Signed)
LMP 11/07/2022   Breastfeeding Unknown    Subjective:    Patient ID: Alexa Diaz, female    DOB: [birth year], 32 y.o.   MRN: [id number]  HPI: Alexa Diaz is a 32 y.o. female  Chief Complaint  Patient presents with   URI    Pt states that she woke up with some congestion and sore throat yesterday morning. States as the day went on, she started having L ear pain. States she can feel fluid in her ear.    UPPER RESPIRATORY TRACT INFECTION Worst symptom:symptoms started yesterday Fever: no Cough:  sometimes due to phlegm Shortness of breath: no Wheezing: no Chest pain: no Chest tightness: no Chest congestion: a little bit Nasal congestion: yes Runny nose: yes Post nasal drip: yes Sneezing:  a little Sore throat: yes Swollen glands: yes Sinus pressure: yes Headache: yes Face pain: no Toothache: no Ear pain: yes left Ear pressure: yes bilateral Eyes red/itching:no Eye drainage/crusting: no  Vomiting: no Rash: no Fatigue: yes Sick contacts: no Strep contacts: no  Context: better Recurrent sinusitis: no Relief with OTC cold/cough medications: no  Treatments attempted: none   Relevant past medical, surgical, family and social history reviewed and updated as indicated. Interim medical history since our last visit reviewed. Allergies and medications reviewed and updated.  Review of Systems  Constitutional:  Positive for fatigue. Negative for fever.  HENT:  Positive for congestion, ear pain, postnasal drip, rhinorrhea, sinus pressure, sinus pain, sneezing and sore throat. Negative for dental problem.   Respiratory:  Negative for cough, shortness of breath and wheezing.   Cardiovascular:  Negative for chest pain.  Gastrointestinal:  Negative for vomiting.  Skin:  Negative for rash.  Neurological:  Positive for headaches.    Per HPI unless specifically indicated above     Objective:    LMP 11/07/2022   Breastfeeding Unknown   Wt Readings from Last 3  Encounters:  09/17/22 260 lb (117.9 kg)  07/23/22 258 lb 9.6 oz (117.3 kg)  07/15/22 260 lb 3.2 oz (118 kg)    Physical Exam Vitals and nursing note reviewed.  HENT:     Head: Normocephalic.     Right Ear: Hearing normal.     Left Ear: Hearing normal.     Nose: Nose normal.  Eyes:     Pupils: Pupils are equal, round, and reactive to light.  Pulmonary:     Effort: Pulmonary effort is normal. No respiratory distress.  Neurological:     Mental Status: She is alert.  Psychiatric:        Mood and Affect: Mood normal.        Behavior: Behavior normal.        Thought Content: Thought content normal.        Judgment: Judgment normal.     Results for orders placed or performed during the hospital encounter of 09/17/22  CBC  Result Value Ref Range   WBC 6.3 4.0 - 10.5 K/uL   RBC 4.02 3.87 - 5.11 MIL/uL   Hemoglobin 11.3 (L) 12.0 - 15.0 g/dL   HCT 29.5 (L) 28.4 - 13.2 %   MCV 87.1 80.0 - 100.0 fL   MCH 28.1 26.0 - 34.0 pg   MCHC 32.3 30.0 - 36.0 g/dL   RDW 44.0 10.2 - 72.5 %   Platelets 197 150 - 400 K/uL   nRBC 0.0 0.0 - 0.2 %  Basic metabolic panel  Result Value Ref Range   Sodium 140 135 - 145 mmol/L  Potassium 4.3 3.5 - 5.1 mmol/L   Chloride 109 98 - 111 mmol/L   CO2 25 22 - 32 mmol/L   Glucose, Bld 95 70 - 99 mg/dL   BUN 14 6 - 20 mg/dL   Creatinine, Ser 0.61 0.44 - 1.00 mg/dL   Calcium 8.8 (L) 8.9 - 10.3 mg/dL   GFR, Estimated >60 >60 mL/min   Anion gap 6 5 - 15  hCG, quantitative, pregnancy  Result Value Ref Range   hCG, Beta Chain, Quant, S 223 (H) <5 mIU/mL  Type and screen [place]  Result Value Ref Range   ABO/RH(D) O POS    Antibody Screen NEG    Sample Expiration      09/20/2022,2359 Performed at [facility], [address] [place], [place] [address]   Surgical pathology  Result Value Ref Range   SURGICAL PATHOLOGY      SURGICAL PATHOLOGY CASE: [id number] PATIENT: Alexa Diaz Surgical Pathology  Report     Specimen Submitted: A. Products of conception  Clinical History: Missed abortion    DIAGNOSIS: A. UTERUS; DILATATION AND CURETTAGE: - CHORIONIC VILLI AND DECIDUA, COMPATIBLE WITH PRODUCTS OF CONCEPTION. - FOCAL ENDOMETRIAL TISSUE WITH GESTATIONAL CHANGES. - NO SOMATIC FETAL PARTS IDENTIFIED. - NO EVIDENCE OF GESTATIONAL TROPHOBLASTIC DISEASE.  GROSS DESCRIPTION: A. Labeled: Products of conception Received: Fresh Collection time: 12:45 PM on 09/17/2022 Placed into formalin time: 1:24 PM on 09/17/2022 Tissue fragment(s): Multiple Size: Aggregate, 7.7 x 7.4 x 2.9 cm Villous tissue: There is a scant amount of potential villous tissue. Fetal tissue: None grossly appreciated. Comment: Received in a clear plastic mesh collection device are fragments of tan-pink membranous tissue, tan-pink soft tissue, pink potential villous tissue, and an abundant amount of blood clot .  Block summary: 1 - 2 - potential villous tissue, submitted entirely 3 - 5 - additional representative sections of membranous tissue, soft tissue, and blood clot  RB 09/17/2022  Final Diagnosis performed by Allena Napoleon, MD.   Electronically signed 09/19/2022 8:54:44AM The electronic signature indicates that the named Attending Pathologist has evaluated the specimen Technical component performed at [facility], [address], [place], [place] [address] Lab: [phone] Dir: Rush Farmer, MD, MMM  Professional component performed at [facility], [facility], [place], [place], [place] [address] Lab: [phone] Dir: Kathi Simpers, MD       Assessment & Plan:   Problem List Items Addressed This Visit   None Visit Diagnoses     Viral upper respiratory infection    -  Primary   Likely viral in nature. Will swab for flu, COVID and strep. Recommend OTC symptom management. Will treat if needed based on lab results. FU if not improved.   Congestion of nasal sinus        Relevant Orders   Veritor Flu A/B Waived   Novel Coronavirus, NAA ([facility])   Sore throat       Relevant Orders   Rapid Strep screen([facility])        Follow up plan: Return if symptoms worsen or fail to improve.   This visit was completed via MyChart due to the restrictions of the COVID-19 pandemic. All issues as above were discussed and addressed. Physical exam was done as above through visual confirmation on MyChart. If it was felt that the patient should be evaluated in the office, they were directed there. The patient verbally consented to this visit. Location of the patient: Home Location of the provider: Office Those involved  with this call:  Provider: Jon Billings, NP CMA: Yvonna Alanis, CMA Front Desk/Registration: Lynnell Catalan This encounter was conducted via video.  I spent 20 dedicated to the care of this patient on the date of this encounter to include previsit review of symptoms, plan of care, and follow up, face to face time with the patient, and post visit ordering of testing.

## 2022-11-10 NOTE — Telephone Encounter (Signed)
Pt scheduled  

## 2022-11-11 LAB — NOVEL CORONAVIRUS, NAA: SARS-CoV-2, NAA: NOT DETECTED

## 2022-11-11 NOTE — Progress Notes (Signed)
Hi Alexa Diaz.  Your Flu and Strep were negative.  I recommend over the counter symptom management with mucinex, tylenol and ibuprofen.  Follow up if symptoms not improved.

## 2022-11-12 NOTE — Progress Notes (Signed)
Hi Shalanda.  Your COVID test was negative.

## 2022-11-13 ENCOUNTER — Encounter: Payer: Self-pay | Admitting: Family Medicine

## 2022-11-13 ENCOUNTER — Ambulatory Visit
Admission: EM | Admit: 2022-11-13 | Discharge: 2022-11-13 | Disposition: A | Discharge status: Home / Self Care | Payer: 59 | Attending: Internal Medicine | Admitting: Internal Medicine

## 2022-11-13 ENCOUNTER — Encounter: Payer: Self-pay | Admitting: Emergency Medicine

## 2022-11-13 DIAGNOSIS — H6502 Acute serous otitis media, left ear: Secondary | ICD-10-CM | POA: Diagnosis not present

## 2022-11-13 LAB — RAPID STREP SCREEN (MED CTR MEBANE ONLY): Strep Gp A Ag, IA W/Reflex: NEGATIVE

## 2022-11-13 LAB — CULTURE, GROUP A STREP

## 2022-11-13 LAB — VERITOR FLU A/B WAIVED
Influenza A: NEGATIVE
Influenza B: NEGATIVE

## 2022-11-13 MED ORDER — FLUTICASONE PROPIONATE 50 MCG/ACT NA SUSP: 1.0000 | Freq: Every day | NASAL | 0 refills | Status: DC

## 2022-11-13 MED ORDER — GUAIFENESIN ER 600 MG PO TB12: 600.0000 mg | ORAL_TABLET | Freq: Two times a day (BID) | ORAL | 0 refills | Status: DC

## 2022-11-13 MED ORDER — AMOXICILLIN-POT CLAVULANATE 875-125 MG PO TABS: 1.0000 | ORAL_TABLET | Freq: Two times a day (BID) | ORAL | 0 refills | Status: DC

## 2022-11-13 NOTE — Discharge Instructions (Addendum)
Humidifier and VapoRub use will help with nasal congestion and cough Please take antibiotics as prescribed You have a left ear infection Please take/Motrin as needed for pain and/or fever Return to urgent care if you have any further concerns.

## 2022-11-13 NOTE — ED Triage Notes (Signed)
Pt presents with left ear pain x 4 days and sinus pressure x 3 days. Pt was tested for covid, flu and strep 3 days ago and those were negative.

## 2022-11-13 NOTE — Progress Notes (Signed)
HI Alexa Diaz. Your strep culture grew a normal type of strep often found in a our bodies.  No need for further treatment at this time.

## 2022-11-15 NOTE — ED Provider Notes (Signed)
[name]    CSN: [id number] Arrival date & time: 11/13/22  0907      History   Chief Complaint Chief Complaint  Patient presents with   Ear Drainage    Entered by patient    HPI Alexa Diaz is a 32 y.o. female comes to the urgent care with 4-day history of severe left ear pain, sinus pressure and nasal congestion.  Patient was evaluated in the primary care physician's office.  Her COVID 19, flu and strep tests were negative.  At the time of her evaluation the ear pain was mild to moderate in severity.  She comes to the urgent care complaining of worsening ear pain especially over the past couple of days.  The pain is sharp, throbbing and persistent.  No ear discharge.  Patient endorses muffling in the left ear.  No ringing sensation.  No dizziness.   HPI  Past Medical History:  Diagnosis Date   Allergic rhinitis    Back pain    3 bulging discs, arthritis and scoliosis 9/15   Chronic post-traumatic stress disorder (PTSD)    Herpes simplex virus infection    1 and 2   Reduction defect of left lower extremity    Short L leg- wears lift    Patient Active Problem List   Diagnosis Date Noted   Positive pregnancy test 07/23/2022   Chronic constipation 07/15/2022   Anxiety 02/18/2022   Cold sore 01/31/2021   Class 3 obesity due to excess calories without serious comorbidity with body mass index (BMI) of 40.0 to 44.9 in adult 11/17/2016   Urticaria 07/10/2015   Leg length discrepancy 06/11/2015   Herpes simplex virus infection    Allergic rhinitis    Chronic post-traumatic stress disorder (PTSD)     Past Surgical History:  Procedure Laterality Date   DILATION AND EVACUATION N/A 09/17/2022   Procedure: SUCTION DILATATION AND CURETTAGE;  Surgeon: Boykin Nearing, MD;  Location: [facility] ORS;  Service: Gynecology;  Laterality: N/A;   NO PAST SURGERIES      OB History     Gravida  3   Para  2   Term  2   Preterm  0   AB  0   Living  1       SAB      IAB      Ectopic      Multiple      Live Births  1            Home Medications    Prior to Admission medications   Medication Sig Start Date End Date Taking? Authorizing Provider  amoxicillin-clavulanate (AUGMENTIN) 875-125 MG tablet Take 1 tablet by mouth every 12 (twelve) hours. 11/13/22  Yes Arnika Larzelere, Myrene Galas, MD  fluticasone (FLONASE) 50 MCG/ACT nasal spray Place 1 spray into both nostrils daily. 11/13/22  Yes Lin Hackmann, Myrene Galas, MD  guaiFENesin (MUCINEX) 600 MG 12 hr tablet Take 1 tablet (600 mg total) by mouth 2 (two) times daily. 11/13/22  Yes Laymond Postle, Myrene Galas, MD  albuterol (VENTOLIN HFA) 108 (90 Base) MCG/ACT inhaler Inhale 2 puffs into the lungs every 6 (six) hours as needed for wheezing or shortness of breath. 01/13/22   Vigg, Avanti, MD  escitalopram (LEXAPRO) 20 MG tablet Take 20 mg by mouth daily. 10/10/22   [provider]  fexofenadine (ALLEGRA ALLERGY) 180 MG tablet Take 1 tablet (180 mg total) by mouth daily. 01/13/22   Vigg, Avanti, MD  ondansetron (ZOFRAN) 4 MG  tablet Take 1 tablet (4 mg total) by mouth every 8 (eight) hours as needed for nausea or vomiting. 02/21/22   Park Liter P, DO  polyethylene glycol powder (GLYCOLAX/MIRALAX) 17 GM/SCOOP powder Take 17 g by mouth 2 (two) times daily as needed. 06/17/22   Valerie Roys, DO    Family History Family History  Problem Relation Age of Onset   Prostate cancer Father        dx late 64s   Alcohol abuse Father    Arthritis Father    Hyperlipidemia Father    Hypertension Father    Thyroid disease Brother    Ovarian cancer Maternal Grandmother    Kidney cancer Maternal Grandmother    Lung cancer Maternal Grandmother    Ovarian cancer Other    Melanoma Other     Social History Social History   Tobacco Use   Smoking status: Former    Packs/day: 0.33    Types: Cigarettes    Quit date: 04/15/2016    Years since quitting: 6.5   Smokeless tobacco: Never  Vaping Use   Vaping Use: Every  day  Substance Use Topics   Alcohol use: Yes    Comment: rare   Drug use: Not Currently     Allergies   Sertraline hcl and Wellbutrin [bupropion]   Review of Systems Review of Systems  Constitutional: Negative.   HENT:  Positive for congestion, ear pain, sinus pressure and sinus pain. Negative for ear discharge and facial swelling.   Eyes: Negative.   Respiratory: Negative.    Cardiovascular: Negative.   Gastrointestinal: Negative.      Physical Exam Triage Vital Signs ED Triage Vitals  Enc Vitals Group     BP 11/13/22 1034 113/77     Pulse Rate 11/13/22 1034 (!) 59     Resp 11/13/22 1034 16     Temp 11/13/22 1034 98.1 F (36.7 C)     Temp Source 11/13/22 1034 Oral     SpO2 11/13/22 1034 97 %     Weight --      Height --      Head Circumference --      Peak Flow --      Pain Score 11/13/22 1033 7     Pain Loc --      Pain Edu? --      Excl. in [place]? --    No data found.  Updated Vital Signs BP 113/77 (BP Location: Right Arm)   Pulse (!) 59   Temp 98.1 F (36.7 C) (Oral)   Resp 16   LMP 11/07/2022   SpO2 97%   Visual Acuity Right Eye Distance:   Left Eye Distance:   Bilateral Distance:    Right Eye Near:   Left Eye Near:    Bilateral Near:     Physical Exam Vitals and nursing note reviewed.  Constitutional:      General: She is in acute distress.     Appearance: She is not ill-appearing.  HENT:     Right Ear: Tympanic membrane normal.     Left Ear: Ear canal normal.     Ears:     Comments: Erythematous right tympanic membrane with middle ear effusion Cardiovascular:     Rate and Rhythm: Normal rate and regular rhythm.     Pulses: Normal pulses.     Heart sounds: Normal heart sounds.  Pulmonary:     Effort: Pulmonary effort is normal.     Breath sounds: Normal  breath sounds.  Abdominal:     General: Bowel sounds are normal.     Palpations: Abdomen is soft.  Neurological:     Mental Status: She is alert.      UC Treatments / Results   Labs (all labs ordered are listed, but only abnormal results are displayed) Labs Reviewed - No data to display  EKG   Radiology No results found.  Procedures Procedures (including critical care time)  Medications Ordered in UC Medications - No data to display  Initial Impression / Assessment and Plan / UC Course  I have reviewed the triage vital signs and the nursing notes.  Pertinent labs & imaging results that were available during my care of the patient were reviewed by me and considered in my medical decision making (see chart for details).     1.  Left otitis media: Augmentin 875-125 mg twice daily for 7 days Fluticasone nasal spray Mucinex twice daily Maintain adequate hydration Return precautions given. Final Clinical Impressions(s) / UC Diagnoses   Final diagnoses:  Acute serous otitis media of left ear, recurrence not specified     Discharge Instructions      Humidifier and VapoRub use will help with nasal congestion and cough Please take antibiotics as prescribed You have a left ear infection Please take/Motrin as needed for pain and/or fever Return to urgent care if you have any further concerns.   ED Prescriptions     Medication Sig Dispense Auth. Provider   amoxicillin-clavulanate (AUGMENTIN) 875-125 MG tablet Take 1 tablet by mouth every 12 (twelve) hours. 14 tablet Arwen Haseley, Britta Mccreedy, MD   fluticasone (FLONASE) 50 MCG/ACT nasal spray Place 1 spray into both nostrils daily. 16 g Merrilee Jansky, MD   guaiFENesin (MUCINEX) 600 MG 12 hr tablet Take 1 tablet (600 mg total) by mouth 2 (two) times daily. 20 tablet Faustino Luecke, Britta Mccreedy, MD      PDMP not reviewed this encounter.   Merrilee Jansky, MD 11/15/22 [phone]

## 2023-02-23 ENCOUNTER — Ambulatory Visit
Admission: RE | Admit: 2023-02-23 | Discharge: 2023-02-23 | Disposition: A | Discharge status: Home / Self Care | Payer: 59 | Source: Ambulatory Visit | Attending: Family Medicine | Admitting: Family Medicine

## 2023-02-23 VITALS — BP 111/80 | HR 72 | Temp 98.4°F | Ht 65.0 in | Wt 270.0 lb

## 2023-02-23 DIAGNOSIS — J069 Acute upper respiratory infection, unspecified: Secondary | ICD-10-CM

## 2023-02-23 DIAGNOSIS — J301 Allergic rhinitis due to pollen: Secondary | ICD-10-CM | POA: Diagnosis not present

## 2023-02-23 MED ORDER — PROMETHAZINE-DM 6.25-15 MG/5ML PO SYRP: 2.5000 mL | ORAL_SOLUTION | Freq: Four times a day (QID) | ORAL | 0 refills | Status: DC | PRN

## 2023-02-23 MED ORDER — CETIRIZINE HCL 10 MG PO TABS: 10.0000 mg | ORAL_TABLET | Freq: Every day | ORAL | 1 refills | Status: DC | PRN

## 2023-02-23 MED ORDER — IPRATROPIUM BROMIDE 0.06 % NA SOLN: 2.0000 | Freq: Four times a day (QID) | NASAL | 1 refills | Status: DC

## 2023-02-23 NOTE — ED Triage Notes (Signed)
Pt c/o sinus pressure,bilateral ear fullness. Pt states lots of head congestion, sneezing since Friday. Pt has been taking mucinex for sxs and does help but is taking it every 4 hours with only a bit of relief.

## 2023-02-23 NOTE — ED Provider Notes (Signed)
[facility]    CSN: [id number] Arrival date & time: 02/23/23  0957      History   Chief Complaint Chief Complaint  Patient presents with   Nasal Congestion   Otalgia   Cough    HPI Alexa Diaz is a 32 y.o. female.   HPI   [name] presents for nasal congestion, sinus pressure and ear pain that started on Friday. She has been sneezing a lot. She has lots of green mucus and headache. Taking Mucinex sinus max without relief.  She has pain behind her eyes. Last night, the cough kept her up all night. It hurts to cough.  She was seen for sinus infection and ear infection.  Face feels swollen. Has not had a fever.       Past Medical History:  Diagnosis Date   Allergic rhinitis    Back pain    3 bulging discs, arthritis and scoliosis 9/15   Chronic post-traumatic stress disorder (PTSD)    Herpes simplex virus infection    1 and 2   Reduction defect of left lower extremity    Short L leg- wears lift    Patient Active Problem List   Diagnosis Date Noted   Positive pregnancy test 07/23/2022   Chronic constipation 07/15/2022   Anxiety 02/18/2022   Cold sore 01/31/2021   Class 3 obesity due to excess calories without serious comorbidity with body mass index (BMI) of 40.0 to 44.9 in adult 11/17/2016   Urticaria 07/10/2015   Leg length discrepancy 06/11/2015   Herpes simplex virus infection    Allergic rhinitis    Chronic post-traumatic stress disorder (PTSD)     Past Surgical History:  Procedure Laterality Date   DILATION AND EVACUATION N/A 09/17/2022   Procedure: SUCTION DILATATION AND CURETTAGE;  Surgeon: Suzy Bouchard, MD;  Location: [facility] ORS;  Service: Gynecology;  Laterality: N/A;   NO PAST SURGERIES      OB History     Gravida  3   Para  2   Term  2   Preterm  0   AB  0   Living  1      SAB      IAB      Ectopic      Multiple      Live Births  1            Home Medications    Prior to Admission  medications   Medication Sig Start Date End Date Taking? Authorizing Provider  cetirizine (ZYRTEC) 10 MG tablet Take 1 tablet (10 mg total) by mouth daily as needed for allergies. 02/23/23  Yes Keavon Sensing, DO  ipratropium (ATROVENT) 0.06 % nasal spray Place 2 sprays into both nostrils 4 (four) times daily. 02/23/23  Yes Aleria Maheu, DO  promethazine-dextromethorphan (PROMETHAZINE-DM) 6.25-15 MG/5ML syrup Take 2.5 mLs by mouth 4 (four) times daily as needed. 02/23/23  Yes Taelar Gronewold, DO  albuterol (VENTOLIN HFA) 108 (90 Base) MCG/ACT inhaler Inhale 2 puffs into the lungs every 6 (six) hours as needed for wheezing or shortness of breath. 01/13/22   Vigg, Avanti, MD  escitalopram (LEXAPRO) 20 MG tablet Take 20 mg by mouth daily. 10/10/22   [provider]  fexofenadine (ALLEGRA ALLERGY) 180 MG tablet Take 1 tablet (180 mg total) by mouth daily. 01/13/22   Vigg, Avanti, MD  fluticasone (FLONASE) 50 MCG/ACT nasal spray Place 1 spray into both nostrils daily. 11/13/22   Lamptey, Britta Mccreedy, MD  ondansetron [facility])  4 MG tablet Take 1 tablet (4 mg total) by mouth every 8 (eight) hours as needed for nausea or vomiting. 02/21/22   Olevia Perches P, DO  polyethylene glycol powder (GLYCOLAX/MIRALAX) 17 GM/SCOOP powder Take 17 g by mouth 2 (two) times daily as needed. 06/17/22   Dorcas Carrow, DO    Family History Family History  Problem Relation Age of Onset   Prostate cancer Father        dx late 5s   Alcohol abuse Father    Arthritis Father    Hyperlipidemia Father    Hypertension Father    Thyroid disease Brother    Ovarian cancer Maternal Grandmother    Kidney cancer Maternal Grandmother    Lung cancer Maternal Grandmother    Ovarian cancer Other    Melanoma Other     Social History Social History   Tobacco Use   Smoking status: Former    Packs/day: .33    Types: Cigarettes    Quit date: 04/15/2016    Years since quitting: 6.8   Smokeless tobacco: Never  Vaping Use    Vaping Use: Every day  Substance Use Topics   Alcohol use: Yes    Comment: rare   Drug use: Not Currently     Allergies   Sertraline hcl and Wellbutrin [bupropion]   Review of Systems Review of Systems: negative unless otherwise stated in HPI.      Physical Exam Triage Vital Signs ED Triage Vitals  Enc Vitals Group     BP 02/23/23 1018 111/80     Pulse Rate 02/23/23 1018 72     Resp --      Temp 02/23/23 1018 98.4 F (36.9 C)     Temp Source 02/23/23 1018 Oral     SpO2 02/23/23 1018 99 %     Weight 02/23/23 1018 270 lb (122.5 kg)     Height 02/23/23 1018 5\' 5"  (1.651 m)     Head Circumference --      Peak Flow --      Pain Score 02/23/23 1017 8     Pain Loc --      Pain Edu? --      Excl. in GC? --    No data found.  Updated Vital Signs BP 111/80 (BP Location: Left Arm)   Pulse 72   Temp 98.4 F (36.9 C) (Oral)   Ht 5\' 5"  (1.651 m)   Wt 122.5 kg   LMP 02/20/2023 (Exact Date)   SpO2 99%   BMI 44.93 kg/m   Visual Acuity Right Eye Distance:   Left Eye Distance:   Bilateral Distance:    Right Eye Near:   Left Eye Near:    Bilateral Near:     Physical Exam GEN:     alert, non-toxic appearing female in no distress    HENT:  mucus membranes moist, oropharyngeal without lesions or exudate, no tonsillar hypertrophy,  mild oropharyngeal erythema,  moderate erythematous edematous turbinates, clear nasal discharge, bilateral TM erythema without bulging or purulence  EYES:   pupils equal and reactive, no scleral injection or discharge NECK:  normal ROM, no meningismus   RESP:  no increased work of breathing, clear to auscultation bilaterally CVS:   regular rate and rhythm Skin:   warm and dry    UC Treatments / Results  Labs (all labs ordered are listed, but only abnormal results are displayed) Labs Reviewed - No data to display  EKG   Radiology No  results found.  Procedures Procedures (including critical care time)  Medications Ordered in  UC Medications - No data to display  Initial Impression / Assessment and Plan / UC Course  I have reviewed the triage vital signs and the nursing notes.  Pertinent labs & imaging results that were available during my care of the patient were reviewed by me and considered in my medical decision making (see chart for details).       Pt is a 32 y.o. female who presents for 3 days of respiratory symptoms. Conner is afebrile here. Satting well on room air. Overall pt is non-toxic appearing, well hydrated, without respiratory distress. Pulmonary exam is unremarkable.  History consistent with seasonal allergic vs viral respiratory illness. Discussed symptomatic treatment.  Explained lack of efficacy of antibiotics in viral disease.  Typical duration of symptoms discussed.  Promethazine DM for cough.  Atrovent nasal spray for nasal congestion and Zyrtec.  Patient with history of allergic rhinitis.  Denies history of asthma.  Return and ED precautions given and voiced understanding. Discussed MDM, treatment plan and plan for follow-up with patient who agrees with plan.     Final Clinical Impressions(s) / UC Diagnoses   Final diagnoses:  Viral URI with cough  Seasonal allergic rhinitis due to pollen     Discharge Instructions      Stop by the pharmacy to pick up your prescriptions.  Follow up with your primary care provider as needed.  You can take Tylenol and/or Ibuprofen as needed for fever reduction and pain relief.    For cough: honey 1/2 to 1 teaspoon (you can dilute the honey in water or another fluid).  You can also use guaifenesin and dextromethorphan for cough. You can use a humidifier for chest congestion and cough.  If you don't have a humidifier, you can sit in the bathroom with the hot shower running.      For sore throat: try warm salt water gargles, Mucinex sore throat cough drops or cepacol lozenges, throat spray, warm tea or water with lemon/honey, popsicles or ice, or OTC  cold relief medicine for throat discomfort. You can also purchase chloraseptic spray at the pharmacy or dollar store.   For congestion: take a daily anti-histamine like Zyrtec, Claritin, and a oral decongestant, such as pseudoephedrine.  You can also use Flonase 1-2 sprays in each nostril daily. Afrin is also a good option, if you do not have high blood pressure.    It is important to stay hydrated: drink plenty of fluids (water, gatorade/powerade/pedialyte, juices, or teas) to keep your throat moisturized and help further relieve irritation/discomfort.    Return or go to the Emergency Department if symptoms worsen or do not improve in the next few days      ED Prescriptions     Medication Sig Dispense Auth. Provider   promethazine-dextromethorphan (PROMETHAZINE-DM) 6.25-15 MG/5ML syrup Take 2.5 mLs by mouth 4 (four) times daily as needed. 118 mL Kahliya Fraleigh, DO   cetirizine (ZYRTEC) 10 MG tablet Take 1 tablet (10 mg total) by mouth daily as needed for allergies. 30 tablet Halvor Behrend, DO   ipratropium (ATROVENT) 0.06 % nasal spray Place 2 sprays into both nostrils 4 (four) times daily. 15 mL Katha Cabal, DO      PDMP not reviewed this encounter.   Katha Cabal, DO 02/23/23 1055

## 2023-02-23 NOTE — Discharge Instructions (Addendum)
Stop by the pharmacy to pick up your prescriptions.  Follow up with your primary care provider as needed.  You can take Tylenol and/or Ibuprofen as needed for fever reduction and pain relief.    For cough: honey 1/2 to 1 teaspoon (you can dilute the honey in water or another fluid).  You can also use guaifenesin and dextromethorphan for cough. You can use a humidifier for chest congestion and cough.  If you don't have a humidifier, you can sit in the bathroom with the hot shower running.      For sore throat: try warm salt water gargles, Mucinex sore throat cough drops or cepacol lozenges, throat spray, warm tea or water with lemon/honey, popsicles or ice, or OTC cold relief medicine for throat discomfort. You can also purchase chloraseptic spray at the pharmacy or dollar store.   For congestion: take a daily anti-histamine like Zyrtec, Claritin, and a oral decongestant, such as pseudoephedrine.  You can also use Flonase 1-2 sprays in each nostril daily. Afrin is also a good option, if you do not have high blood pressure.    It is important to stay hydrated: drink plenty of fluids (water, gatorade/powerade/pedialyte, juices, or teas) to keep your throat moisturized and help further relieve irritation/discomfort.    Return or go to the Emergency Department if symptoms worsen or do not improve in the next few days   

## 2023-02-26 ENCOUNTER — Ambulatory Visit: Payer: 59 | Admitting: Physician Assistant

## 2023-03-05 ENCOUNTER — Encounter: Payer: Self-pay | Admitting: Family Medicine

## 2023-03-05 ENCOUNTER — Ambulatory Visit (INDEPENDENT_AMBULATORY_CARE_PROVIDER_SITE_OTHER): Payer: 59 | Admitting: Family Medicine

## 2023-03-05 VITALS — BP 138/88 | HR 69 | Temp 98.3°F | Ht 65.0 in | Wt 273.2 lb

## 2023-03-05 DIAGNOSIS — H66002 Acute suppurative otitis media without spontaneous rupture of ear drum, left ear: Secondary | ICD-10-CM | POA: Diagnosis not present

## 2023-03-05 MED ORDER — AMOXICILLIN-POT CLAVULANATE 875-125 MG PO TABS: 1.0000 | ORAL_TABLET | Freq: Two times a day (BID) | ORAL | 0 refills | Status: DC

## 2023-03-05 NOTE — Progress Notes (Signed)
BP 138/88   Pulse 69   Temp 98.3 F (36.8 C) (Oral)   Ht 5\' 5"  (1.651 m)   Wt 273 lb 3.2 oz (123.9 kg)   LMP 02/20/2023 (Exact Date)   SpO2 100%   BMI 45.46 kg/m    Subjective:    Patient ID: Alexa Diaz, female    DOB: 03/25/1991, 32 y.o.   MRN: [id number]  HPI: Alexa Diaz is a 32 y.o. female  Chief Complaint  Patient presents with   Cough    Patient says about two weeks ago, she woke up sounding horrible and says she had congestion in her head. Patient says she thought it was just allergies and she went to Urgent Care last Monday. Patient said she had taken Mucinex DM. Patient says she was prescribed Zyrtec, Promethazine and Nasal Spray and told her it was allergies. Patient says she thought she was getting, but the cough hasn't gotten any better.    Sinus Problem   Ear Pain   UPPER RESPIRATORY TRACT INFECTION Duration: 2 weeks Worst symptom: cough and congestion Fever: no Cough: yes Shortness of breath: no Wheezing: no Chest pain: yes, with cough Chest tightness: yes Chest congestion: yes Nasal congestion: yes Runny nose: yes Post nasal drip: yes Sneezing: no Sore throat: no Swollen glands: no Sinus pressure: yes Headache: yes Face pain: no Toothache: no Ear pain: yes  Ear pressure: yes  Eyes red/itching:no Eye drainage/crusting: no  Vomiting: no Rash: no Fatigue: yes Sick contacts: no Strep contacts: no  Context: better Recurrent sinusitis: no Relief with OTC cold/cough medications: no  Treatments attempted: mucinex DM and zyrtec   Relevant past medical, surgical, family and social history reviewed and updated as indicated. Interim medical history since our last visit reviewed. Allergies and medications reviewed and updated.  Review of Systems  Constitutional: Negative.   HENT:  Positive for congestion, ear pain, postnasal drip, rhinorrhea, sinus pressure and sinus pain. Negative for dental problem, drooling, ear discharge, facial  swelling, hearing loss, mouth sores, nosebleeds, sneezing, sore throat, tinnitus, trouble swallowing and voice change.   Respiratory:  Positive for cough, shortness of breath and wheezing. Negative for apnea, choking, chest tightness and stridor.   Cardiovascular: Negative.   Neurological: Negative.   Psychiatric/Behavioral: Negative.      Per HPI unless specifically indicated above     Objective:    BP 138/88   Pulse 69   Temp 98.3 F (36.8 C) (Oral)   Ht 5\' 5"  (1.651 m)   Wt 273 lb 3.2 oz (123.9 kg)   LMP 02/20/2023 (Exact Date)   SpO2 100%   BMI 45.46 kg/m   Wt Readings from Last 3 Encounters:  03/05/23 273 lb 3.2 oz (123.9 kg)  02/23/23 270 lb (122.5 kg)  09/17/22 260 lb (117.9 kg)    Physical Exam Vitals and nursing note reviewed.  Constitutional:      General: She is not in acute distress.    Appearance: Normal appearance. She is not ill-appearing, toxic-appearing or diaphoretic.  HENT:     Head: Normocephalic and atraumatic.     Right Ear: Ear canal and external ear normal.     Left Ear: External ear normal. Tympanic membrane is erythematous and bulging.     Nose: Congestion and rhinorrhea present.     Mouth/Throat:     Mouth: Mucous membranes are moist.     Pharynx: Oropharynx is clear. Posterior oropharyngeal erythema present. No oropharyngeal exudate.  Eyes:     General:  No scleral icterus.       Right eye: No discharge.        Left eye: No discharge.     Extraocular Movements: Extraocular movements intact.     Conjunctiva/sclera: Conjunctivae normal.     Pupils: Pupils are equal, round, and reactive to light.  Cardiovascular:     Rate and Rhythm: Normal rate and regular rhythm.     Pulses: Normal pulses.     Heart sounds: Normal heart sounds. No murmur heard.    No friction rub. No gallop.  Pulmonary:     Effort: Pulmonary effort is normal. No respiratory distress.     Breath sounds: Normal breath sounds. No stridor. No wheezing, rhonchi or rales.   Chest:     Chest wall: No tenderness.  Musculoskeletal:        General: Normal range of motion.     Cervical back: Normal range of motion and neck supple.  Skin:    General: Skin is warm and dry.     Capillary Refill: Capillary refill takes less than 2 seconds.     Coloration: Skin is not jaundiced or pale.     Findings: No bruising, erythema, lesion or rash.  Neurological:     General: No focal deficit present.     Mental Status: She is alert and oriented to person, place, and time. Mental status is at baseline.  Psychiatric:        Mood and Affect: Mood normal.        Behavior: Behavior normal.        Thought Content: Thought content normal.        Judgment: Judgment normal.     Results for orders placed or performed in visit on 11/10/22  Rapid Strep screen([facility])   Specimen: Other   Other  Result Value Ref Range   Strep Gp A Ag, IA W/Reflex Negative Negative  Novel Coronavirus, NAA ([facility])   Specimen: Nasopharyngeal(NP) swabs in vial transport medium  Result Value Ref Range   SARS-CoV-2, NAA Not Detected Not Detected  Culture, Group A Strep   Other  Result Value Ref Range   Strep A Culture Comment (A)   Veritor Flu A/B Waived  Result Value Ref Range   Influenza A Negative Negative   Influenza B Negative Negative      Assessment & Plan:   Problem List Items Addressed This Visit   None Visit Diagnoses     Non-recurrent acute suppurative otitis media of left ear without spontaneous rupture of tympanic membrane    -  Primary   Will treat with augmentin. Call with any concerns or if not getting better.   Relevant Medications   amoxicillin-clavulanate (AUGMENTIN) 875-125 MG tablet        Follow up plan: Return if symptoms worsen or fail to improve.

## 2023-03-16 ENCOUNTER — Encounter: Payer: Self-pay | Admitting: Family Medicine

## 2023-03-17 MED ORDER — FLUCONAZOLE 150 MG PO TABS: 150.0000 mg | ORAL_TABLET | Freq: Once | ORAL | 0 refills | Status: AC

## 2023-05-06 ENCOUNTER — Encounter: Payer: Self-pay | Admitting: Family Medicine

## 2023-05-07 NOTE — Telephone Encounter (Signed)
Appt if available

## 2023-05-07 NOTE — Telephone Encounter (Signed)
Called to schedule the patient but she stated that she was feeling better today.  So she stated, "I am going to pass on the appointment, but if I need her I will call you back."

## 2023-06-12 ENCOUNTER — Encounter: Payer: Self-pay | Admitting: Orthopedic Surgery

## 2023-06-12 ENCOUNTER — Other Ambulatory Visit: Payer: Self-pay | Admitting: Orthopedic Surgery

## 2023-06-12 DIAGNOSIS — M5459 Other low back pain: Secondary | ICD-10-CM

## 2023-06-12 DIAGNOSIS — M4125 Other idiopathic scoliosis, thoracolumbar region: Secondary | ICD-10-CM

## 2023-06-16 ENCOUNTER — Ambulatory Visit
Admission: RE | Admit: 2023-06-16 | Discharge: 2023-06-16 | Disposition: A | Discharge status: Home / Self Care | Payer: 59 | Source: Ambulatory Visit | Attending: Orthopedic Surgery | Admitting: Orthopedic Surgery

## 2023-06-16 DIAGNOSIS — M5459 Other low back pain: Secondary | ICD-10-CM

## 2023-06-16 DIAGNOSIS — M4125 Other idiopathic scoliosis, thoracolumbar region: Secondary | ICD-10-CM

## 2023-06-24 ENCOUNTER — Encounter: Payer: Self-pay | Admitting: Family Medicine

## 2023-06-24 NOTE — Telephone Encounter (Signed)
Appt, virtual provider OK

## 2023-06-25 NOTE — Telephone Encounter (Signed)
Called and scheduled patient on 06/26/2023 @ 10:40 am.

## 2023-06-26 ENCOUNTER — Telehealth (INDEPENDENT_AMBULATORY_CARE_PROVIDER_SITE_OTHER): Payer: 59 | Admitting: Physician Assistant

## 2023-06-26 ENCOUNTER — Encounter: Payer: Self-pay | Admitting: Physician Assistant

## 2023-06-26 DIAGNOSIS — T3695XA Adverse effect of unspecified systemic antibiotic, initial encounter: Secondary | ICD-10-CM

## 2023-06-26 DIAGNOSIS — L03211 Cellulitis of face: Secondary | ICD-10-CM

## 2023-06-26 DIAGNOSIS — B379 Candidiasis, unspecified: Secondary | ICD-10-CM

## 2023-06-26 MED ORDER — MUPIROCIN 2 % EX OINT: 1.0000 | TOPICAL_OINTMENT | Freq: Two times a day (BID) | CUTANEOUS | 0 refills | Status: DC

## 2023-06-26 MED ORDER — CEPHALEXIN 500 MG PO CAPS: 500.0000 mg | ORAL_CAPSULE | Freq: Four times a day (QID) | ORAL | 0 refills | Status: AC

## 2023-06-26 MED ORDER — FLUCONAZOLE 150 MG PO TABS: 150.0000 mg | ORAL_TABLET | ORAL | 0 refills | Status: DC | PRN

## 2023-06-26 NOTE — Progress Notes (Unsigned)
Virtual Visit via Video Note  I connected with Alexa Diaz on 07/01/23 at 10:40 AM EDT by a video enabled telemedicine application and verified that I am speaking with the correct person using two identifiers.  Today's Provider: Jacquelin Hawking, MHS, PA-C Introduced myself to the patient as a PA-C and provided education on APPs in clinical practice.   Location: Patient: at home  Provider: [facility], [place], [place]   I discussed the limitations of evaluation and management by telemedicine and the availability of in person appointments. The patient expressed understanding and agreed to proceed.  Chief Complaint  Patient presents with   Eye Problem    Patient says she went two weeks ago to have her Eyebrow Microblade. Patient says she has done this before the last time, but she noticed an infection. Patient says she has been trying Allergy medication to help with the symptoms, but she said it did not help. Patent says her R eyelid is swollen. Patient says she reached out to the [facility] and says she was informed to apply Neosporin to the area and it isn't helping. Patient says she is concerned about an eye infection and says it is red.     History of Present Illness:  She is concerned for    Has been ongoing since the beginning of this week She states about 2 weeks ago she had Microblading done for her eyebrows  She states she noticed increased swelling, yellowish drainage, itching, soreness along her right eyebrow  She states she has noticed some eye/ eyelid swelling and soreness She denies vision changes, fever, difficulty with EOM  She reports some mild pain with looking to the left with EOM testing      Review of Systems  Skin:  Positive for rash.    Observations/Objective:  Due to the nature of the virtual visit, physical exam and observations are limited. Able to obtain the following observations:   Alert, oriented, Appears comfortable, in no acute  distress.  No scleral injection, no appreciated hoarseness, tachypnea, wheeze or strider. Able to maintain conversation without visible strain.  EOM appears intact bilaterally Scabbing and redness noted over right eyebrow- difficult to discern if there is drainage or edema to surrounding area No cough appreciated during visit.   Assessment and Plan:  Problem List Items Addressed This Visit   None Visit Diagnoses     Cellulitis of face    -  Primary Acute, new concern Patient reports rash with swelling, yellowish drainage and itching along her right eyebrow after microblading procedure Physical exam limited due to virtual visit but there appears to be a erythematous crusted rash over patient's right eyebrow consistent with likely cellulitis Will send in prescription for Keflex 500 mg p.o. 4 times daily x 7 days along with mupirocin ointment to be used topically.  Twice daily x 7 days Recommend that she stop using any other topical applications and keep the area clean and dry with warm water and mild soap Patient reports that she also gets yeast infections when taking antibiotics so will send in Diflucan to assist with this Reviewed ED and return precautions Follow-up as needed for progressing or persistent symptoms   Relevant Medications   cephALEXin (KEFLEX) 500 MG capsule   mupirocin ointment (BACTROBAN) 2 %   Antibiotic-induced yeast infection       Relevant Medications   cephALEXin (KEFLEX) 500 MG capsule   mupirocin ointment (BACTROBAN) 2 %   fluconazole (DIFLUCAN) 150 MG tablet  Follow Up Instructions:    I discussed the assessment and treatment plan with the patient. The patient was provided an opportunity to ask questions and all were answered. The patient agreed with the plan and demonstrated an understanding of the instructions.   The patient was advised to call back or seek an in-person evaluation if the symptoms worsen or if the condition fails to improve as  anticipated.  I provided 11 minutes of non-face-to-face time during this encounter.  No follow-ups on file.   I, Myan Locatelli E Dayanne Yiu, PA-C, have reviewed all documentation for this visit. The documentation on 07/01/23 for the exam, diagnosis, procedures, and orders are all accurate and complete.   Jacquelin Hawking, MHS, PA-C [facility]

## 2023-07-08 ENCOUNTER — Telehealth: Payer: 59 | Admitting: Physician Assistant

## 2023-07-08 ENCOUNTER — Encounter: Payer: Self-pay | Admitting: Physician Assistant

## 2023-07-08 DIAGNOSIS — L03211 Cellulitis of face: Secondary | ICD-10-CM

## 2023-07-08 MED ORDER — SULFAMETHOXAZOLE-TRIMETHOPRIM 800-160 MG PO TABS: 1.0000 | ORAL_TABLET | Freq: Two times a day (BID) | ORAL | 0 refills | Status: AC

## 2023-07-08 NOTE — Progress Notes (Signed)
Virtual Visit via Video Note  I connected with Alexa Diaz on 07/08/23 at  8:20 AM EDT by a video enabled telemedicine application and verified that I am speaking with the correct person using two identifiers.  Location: Patient: at home  Provider: [facility] [place], [place], [place]    I discussed the limitations of evaluation and management by telemedicine and the availability of in person appointments. The patient expressed understanding and agreed to proceed.  Chief Complaint  Patient presents with   Eye Problem    Patient says she has finished the course of treatment and she has still been using the antibiotic ointment and cleaning the area. Patient says it has gone down and says it is not completely healing. Patient says she feels the antibiotic did not completely heal the area or weren't strong enough. Patient says she is still having itchiness and redness. Patient says she is still noticing infection in the area as she noticed some pus like drainage over the weekend.     History of Present Illness:     She reports she is still concerned for infection  She states she is still noticing redness and over the weekend she had some pus come from the area  She states she has not had any more pus come out of the area but it remains red  She states there is still tenderness to touch but swelling is minimal She does note itchiness   She is cleaning with Micellar water prior to applying Mupirocin ointment  She reports she is applying the mupirocin ointment all throughout the day and states this sometimes helps with the itching    Observations/Objective:  Due to the nature of the virtual visit, physical exam and observations are limited. Able to obtain the following observations:   Alert, oriented, Appears comfortable, in no acute distress.  No scleral injection, no appreciated hoarseness, tachypnea, wheeze or strider. Able to maintain conversation without visible  strain.  No cough appreciated during visit.  Mild redness noted along middle of right eyebrow - no apparent drainage and swelling appears minimal   Assessment and Plan:  Problem List Items Addressed This Visit   None Visit Diagnoses     Cellulitis of face    -  Primary Acute, ongoing Appears to be improving but patient is still having redness and itching along right eyebrow despite finishing Keflex and using Mupirocin ointment Will try 5 day course of bactrim PO BID  Recommend she stops using cleansers and only uses warm water and gentle soap for cleaning until healed. Recommend using Aquaphor or Vaseline for moisturizing  Follow up as needed for persistent or progressing symptoms     Relevant Medications   sulfamethoxazole-trimethoprim (BACTRIM DS) 800-160 MG tablet      Follow Up Instructions:    I discussed the assessment and treatment plan with the patient. The patient was provided an opportunity to ask questions and all were answered. The patient agreed with the plan and demonstrated an understanding of the instructions.   The patient was advised to call back or seek an in-person evaluation if the symptoms worsen or if the condition fails to improve as anticipated.  I provided 9 minutes of non-face-to-face time during this encounter.   No follow-ups on file.   I, Quintrell Baze E Octa Uplinger, PA-C, have reviewed all documentation for this visit. The documentation on 07/08/23 for the exam, diagnosis, procedures, and orders are all accurate and complete.   Khadijatou Borak, MHS, PA-C  [facility]

## 2023-08-17 ENCOUNTER — Ambulatory Visit (INDEPENDENT_AMBULATORY_CARE_PROVIDER_SITE_OTHER): Payer: 59 | Admitting: Family Medicine

## 2023-08-17 ENCOUNTER — Encounter: Payer: Self-pay | Admitting: Family Medicine

## 2023-08-17 VITALS — BP 113/82 | HR 80 | Temp 98.2°F | Ht 64.0 in | Wt 266.0 lb

## 2023-08-17 DIAGNOSIS — Z0000 Encounter for general adult medical examination without abnormal findings: Secondary | ICD-10-CM | POA: Diagnosis not present

## 2023-08-17 DIAGNOSIS — Z9889 Other specified postprocedural states: Secondary | ICD-10-CM | POA: Diagnosis not present

## 2023-08-17 NOTE — Assessment & Plan Note (Signed)
Needs to drop additional weight before scoliosis surgery. Would benefit from wegovy or zepbound. She will check on coverage and let us know. If not covered, consider naltrexone to help with cravings. Call with any concerns. Recheck in about 8 weeks.

## 2023-08-17 NOTE — Patient Instructions (Signed)
Wegovy and Zepbound 

## 2023-08-17 NOTE — Progress Notes (Signed)
BP 113/82 (BP Location: Right Arm, Patient Position: Sitting, Cuff Size: Large)   Pulse 80   Temp 98.2 F (36.8 C) (Oral)   Ht 5\' 4"  (1.626 m)   Wt 266 lb (120.7 kg)   SpO2 98%   BMI 45.66 kg/m    Subjective:    Patient ID: Alexa Diaz, female    DOB: [birth year], 32 y.o.   MRN: [id number]  HPI: Alexa Diaz is a 32 y.o. female presenting on 08/17/2023 for comprehensive medical examination. Current medical complaints include:  Going to have to have back surgery. Has been following with back surgeon in [place] about her scoliosis. They are very concerned about her weight. She notes that she continues to eat whatever she eats and has been able to lose weight in the past by cutting back on her portions. However, she feels like she has plateaued.   OBESITY Duration: chronic Previous attempts at weight loss: yes Complications of obesity: anxiety, back pain Peak weight: 305lbs Weight loss goal: 160-175 (final goal) 200 for surgery Weight loss to date: 39lbs Requesting obesity pharmacotherapy: yes Current weight loss supplements/medications: no Previous weight loss supplements/meds: no  Would like to see a different OB GYN. Had a bad experience with [facility] and would like to get in with someone else.   She currently lives with: husband and daughter Menopausal Symptoms: no  Depression Screen done today and results listed below:     08/17/2023    2:51 PM 03/05/2023   10:24 AM 08/01/2022   10:25 AM 07/23/2022    4:45 PM 07/15/2022    2:58 PM  Depression screen PHQ 2/9  Decreased Interest 1 1 1 1 1   Down, Depressed, Hopeless 0 0 0 1 1  PHQ - 2 Score 1 1 1 2 2   Altered sleeping 0 0 0 1 0  Tired, decreased energy 1 1 3 2 1   Change in appetite 0 1 1 0 0  Feeling bad or failure about yourself  0 0 0 1 0  Trouble concentrating 1 0 1 1 0  Moving slowly or fidgety/restless 0 0 0 0 0  Suicidal thoughts 0 0 0 0 0  PHQ-9 Score 3 3 6 7 3   Difficult doing work/chores  Not difficult  at all Somewhat difficult Somewhat difficult     Past Medical History:  Past Medical History:  Diagnosis Date   Allergic rhinitis    Back pain    3 bulging discs, arthritis and scoliosis 9/15   Chronic post-traumatic stress disorder (PTSD)    Herpes simplex virus infection    1 and 2   Reduction defect of left lower extremity    Short L leg- wears lift    Surgical History:  Past Surgical History:  Procedure Laterality Date   DILATION AND EVACUATION N/A 09/17/2022   Procedure: SUCTION DILATATION AND CURETTAGE;  Surgeon: Suzy Bouchard, MD;  Location: [facility] ORS;  Service: Gynecology;  Laterality: N/A;   NO PAST SURGERIES      Medications:  Current Outpatient Medications on File Prior to Visit  Medication Sig   albuterol (VENTOLIN HFA) 108 (90 Base) MCG/ACT inhaler Inhale 2 puffs into the lungs every 6 (six) hours as needed for wheezing or shortness of breath.   cetirizine (ZYRTEC) 10 MG tablet Take 1 tablet (10 mg total) by mouth daily as needed for allergies.   mupirocin ointment (BACTROBAN) 2 % Apply 1 Application topically 2 (two) times daily.   naproxen (NAPROSYN) 500 MG tablet  Take 500 mg by mouth 2 (two) times daily.   No current facility-administered medications on file prior to visit.    Allergies:  Allergies  Allergen Reactions   Sertraline Hcl Other (See Comments)    Not effective   Wellbutrin [Bupropion] Rash    Social History:  Social History   Socioeconomic History   Marital status: Divorced    Spouse name: Not on file   Number of children: Not on file   Years of education: Not on file   Highest education level: Not on file  Occupational History   Not on file  Tobacco Use   Smoking status: Former    Current packs/day: 0.00    Types: Cigarettes    Quit date: 04/15/2016    Years since quitting: 7.3   Smokeless tobacco: Never  Vaping Use   Vaping status: Every Day  Substance and Sexual Activity   Alcohol use: Yes    Comment: rare   Drug  use: Not Currently   Sexual activity: Yes  Other Topics Concern   Not on file  Social History Narrative   Not on file   Social Determinants of Health   Financial Resource Strain: Not on file  Food Insecurity: Not on file  Transportation Needs: Not on file  Physical Activity: Not on file  Stress: Not on file  Social Connections: Not on file  Intimate Partner Violence: Not on file   Social History   Tobacco Use  Smoking Status Former   Current packs/day: 0.00   Types: Cigarettes   Quit date: 04/15/2016   Years since quitting: 7.3  Smokeless Tobacco Never   Social History   Substance and Sexual Activity  Alcohol Use Yes   Comment: rare    Family History:  Family History  Problem Relation Age of Onset   Prostate cancer Father        dx late 73s   Alcohol abuse Father    Arthritis Father    Hyperlipidemia Father    Hypertension Father    Thyroid disease Brother    Ovarian cancer Maternal Grandmother    Kidney cancer Maternal Grandmother    Lung cancer Maternal Grandmother    Ovarian cancer Other    Melanoma Other     Past medical history, surgical history, medications, allergies, family history and social history reviewed with patient today and changes made to appropriate areas of the chart.   Review of Systems  Constitutional: Negative.   HENT: Negative.    Eyes: Negative.   Respiratory:  Positive for shortness of breath (due to the scoliosis). Negative for cough, hemoptysis, sputum production and wheezing.   Cardiovascular: Negative.   Gastrointestinal: Negative.   Genitourinary: Negative.   Musculoskeletal:  Positive for back pain. Negative for falls, joint pain, myalgias and neck pain.  Skin: Negative.   Neurological: Negative.   Endo/Heme/Allergies:  Negative for environmental allergies and polydipsia. Bruises/bleeds easily.  Psychiatric/Behavioral: Negative.     All other ROS negative except what is listed above and in the HPI.      Objective:     BP 113/82 (BP Location: Right Arm, Patient Position: Sitting, Cuff Size: Large)   Pulse 80   Temp 98.2 F (36.8 C) (Oral)   Ht 5\' 4"  (1.626 m)   Wt 266 lb (120.7 kg)   SpO2 98%   BMI 45.66 kg/m   Wt Readings from Last 3 Encounters:  08/17/23 266 lb (120.7 kg)  03/05/23 273 lb 3.2 oz (123.9 kg)  02/23/23 270 lb (122.5 kg)    Physical Exam Vitals and nursing note reviewed.  Constitutional:      General: She is not in acute distress.    Appearance: Normal appearance. She is obese. She is not ill-appearing, toxic-appearing or diaphoretic.  HENT:     Head: Normocephalic and atraumatic.     Right Ear: Tympanic membrane, ear canal and external ear normal. There is no impacted cerumen.     Left Ear: Tympanic membrane, ear canal and external ear normal. There is no impacted cerumen.     Nose: Nose normal. No congestion or rhinorrhea.     Mouth/Throat:     Mouth: Mucous membranes are moist.     Pharynx: Oropharynx is clear. No oropharyngeal exudate or posterior oropharyngeal erythema.  Eyes:     General: No scleral icterus.       Right eye: No discharge.        Left eye: No discharge.     Extraocular Movements: Extraocular movements intact.     Conjunctiva/sclera: Conjunctivae normal.     Pupils: Pupils are equal, round, and reactive to light.  Neck:     Vascular: No carotid bruit.  Cardiovascular:     Rate and Rhythm: Normal rate and regular rhythm.     Pulses: Normal pulses.     Heart sounds: No murmur heard.    No friction rub. No gallop.  Pulmonary:     Effort: Pulmonary effort is normal. No respiratory distress.     Breath sounds: Normal breath sounds. No stridor. No wheezing, rhonchi or rales.  Chest:     Chest wall: No tenderness.  Abdominal:     General: Abdomen is flat. Bowel sounds are normal. There is no distension.     Palpations: Abdomen is soft. There is no mass.     Tenderness: There is no abdominal tenderness. There is no right CVA tenderness, left CVA  tenderness, guarding or rebound.     Hernia: No hernia is present.  Genitourinary:    Comments: Breast and pelvic exams deferred with shared decision making Musculoskeletal:        General: No swelling, tenderness, deformity or signs of injury.     Cervical back: Normal range of motion and neck supple. No rigidity. No muscular tenderness.     Right lower leg: No edema.     Left lower leg: No edema.  Lymphadenopathy:     Cervical: No cervical adenopathy.  Skin:    General: Skin is warm and dry.     Capillary Refill: Capillary refill takes less than 2 seconds.     Coloration: Skin is not jaundiced or pale.     Findings: No bruising, erythema, lesion or rash.  Neurological:     General: No focal deficit present.     Mental Status: She is alert and oriented to person, place, and time. Mental status is at baseline.     Cranial Nerves: No cranial nerve deficit.     Sensory: No sensory deficit.     Motor: No weakness.     Coordination: Coordination normal.     Gait: Gait normal.     Deep Tendon Reflexes: Reflexes normal.  Psychiatric:        Mood and Affect: Mood normal.        Behavior: Behavior normal.        Thought Content: Thought content normal.        Judgment: Judgment normal.     Results for orders  placed or performed in visit on 11/10/22  Rapid Strep screen([facility])   Specimen: Other   Other  Result Value Ref Range   Strep Gp A Ag, IA W/Reflex Negative Negative  Novel Coronavirus, NAA ([facility])   Specimen: Nasopharyngeal(NP) swabs in vial transport medium  Result Value Ref Range   SARS-CoV-2, NAA Not Detected Not Detected  Culture, Group A Strep   Other  Result Value Ref Range   Strep A Culture Comment (A)   Veritor Flu A/B Waived  Result Value Ref Range   Influenza A Negative Negative   Influenza B Negative Negative      Assessment & Plan:   Problem List Items Addressed This Visit       Other   Morbid obesity (HCC)    Needs to drop additional  weight before scoliosis surgery. Would benefit from wegovy or zepbound. She will check on coverage and let us know. If not covered, consider naltrexone to help with cravings. Call with any concerns. Recheck in about 8 weeks.       Other Visit Diagnoses     Routine general medical examination at a health care facility    -  Primary   Vaccines up to date/declined. Screening labs checked today. Pap up to date. Continue diet and exercise. Call with any concerns.   Relevant Orders   CBC with Differential/Platelet   Comprehensive metabolic panel   Lipid Panel w/o Chol/HDL Ratio   TSH   Hepatitis C Antibody   History of dilatation and curettage       Bad experience with last OBGYN- would like new one. Referral placed today.   Relevant Orders   Ambulatory referral to Obstetrics / Gynecology        Follow up plan: Return in about 2 months (around 10/17/2023).   LABORATORY TESTING:  - Pap smear: not applicable  IMMUNIZATIONS:   - Tdap: Tetanus vaccination status reviewed: last tetanus booster within 10 years. - Influenza: Refused - Pneumovax: Not applicable - Prevnar: Not applicable - COVID: Refused - HPV: Not applicable  PATIENT COUNSELING:   Advised to take 1 mg of folate supplement per day if capable of pregnancy.   Sexuality: Discussed sexually transmitted diseases, partner selection, use of condoms, avoidance of unintended pregnancy  and contraceptive alternatives.   Advised to avoid cigarette smoking.  I discussed with the patient that most people either abstain from alcohol or drink within safe limits (<=14/week and <=4 drinks/occasion for males, <=7/weeks and <= 3 drinks/occasion for females) and that the risk for alcohol disorders and other health effects rises proportionally with the number of drinks per week and how often a drinker exceeds daily limits.  Discussed cessation/primary prevention of drug use and availability of treatment for abuse.   Diet: Encouraged to  adjust caloric intake to maintain  or achieve ideal body weight, to reduce intake of dietary saturated fat and total fat, to limit sodium intake by avoiding high sodium foods and not adding table salt, and to maintain adequate dietary potassium and calcium preferably from fresh fruits, vegetables, and low-fat dairy products.    stressed the importance of regular exercise  Injury prevention: Discussed safety belts, safety helmets, smoke detector, smoking near bedding or upholstery.   Dental health: Discussed importance of regular tooth brushing, flossing, and dental visits.    NEXT PREVENTATIVE PHYSICAL DUE IN 1 YEAR. Return in about 2 months (around 10/17/2023).

## 2023-08-18 LAB — COMPREHENSIVE METABOLIC PANEL
ALT: 9 [IU]/L (ref 0–32)
AST: 13 [IU]/L (ref 0–40)
Albumin: 4.2 g/dL (ref 3.9–4.9)
Alkaline Phosphatase: 59 [IU]/L (ref 44–121)
BUN/Creatinine Ratio: 15 (ref 9–23)
BUN: 11 mg/dL (ref 6–20)
Bilirubin Total: 0.2 mg/dL (ref 0.0–1.2)
CO2: 23 mmol/L (ref 20–29)
Calcium: 9.2 mg/dL (ref 8.7–10.2)
Chloride: 108 mmol/L — ABNORMAL HIGH (ref 96–106)
Creatinine, Ser: 0.74 mg/dL (ref 0.57–1.00)
Globulin, Total: 2.1 g/dL (ref 1.5–4.5)
Glucose: 81 mg/dL (ref 70–99)
Potassium: 3.9 mmol/L (ref 3.5–5.2)
Sodium: 144 mmol/L (ref 134–144)
Total Protein: 6.3 g/dL (ref 6.0–8.5)
eGFR: 110 mL/min/{1.73_m2} (ref >59)

## 2023-08-18 LAB — LIPID PANEL W/O CHOL/HDL RATIO
Cholesterol, Total: 185 mg/dL (ref 100–199)
HDL: 39 mg/dL — ABNORMAL LOW (ref >39)
LDL Chol Calc (NIH): 120 mg/dL — ABNORMAL HIGH (ref 0–99)
Triglycerides: 143 mg/dL (ref 0–149)
VLDL Cholesterol Cal: 26 mg/dL (ref 5–40)

## 2023-08-18 LAB — TSH: TSH: 0.528 u[IU]/mL (ref 0.450–4.500)

## 2023-08-18 LAB — CBC WITH DIFFERENTIAL/PLATELET
Basophils Absolute: 0 10*3/uL (ref 0.0–0.2)
Basos: 0 %
EOS (ABSOLUTE): 0.1 10*3/uL (ref 0.0–0.4)
Eos: 1 %
Hematocrit: 36.5 % (ref 34.0–46.6)
Hemoglobin: 11.7 g/dL (ref 11.1–15.9)
Immature Grans (Abs): 0 10*3/uL (ref 0.0–0.1)
Immature Granulocytes: 0 %
Lymphocytes Absolute: 2 10*3/uL (ref 0.7–3.1)
Lymphs: 30 %
MCH: 27.3 pg (ref 26.6–33.0)
MCHC: 32.1 g/dL (ref 31.5–35.7)
MCV: 85 fL (ref 79–97)
Monocytes Absolute: 0.4 10*3/uL (ref 0.1–0.9)
Monocytes: 6 %
Neutrophils Absolute: 4.1 10*3/uL (ref 1.4–7.0)
Neutrophils: 63 %
Platelets: 205 10*3/uL (ref 150–450)
RBC: 4.28 x10E6/uL (ref 3.77–5.28)
RDW: 14.2 % (ref 11.7–15.4)
WBC: 6.5 10*3/uL (ref 3.4–10.8)

## 2023-08-18 LAB — HEPATITIS C ANTIBODY: Hep C Virus Ab: NONREACTIVE

## 2023-08-31 ENCOUNTER — Encounter: Payer: Self-pay | Admitting: Family Medicine

## 2023-09-08 ENCOUNTER — Encounter: Payer: Self-pay | Admitting: Family Medicine

## 2023-09-08 ENCOUNTER — Ambulatory Visit: Payer: Self-pay

## 2023-09-08 MED ORDER — VALACYCLOVIR HCL 1 G PO TABS: 1000.0000 mg | ORAL_TABLET | Freq: Two times a day (BID) | ORAL | 0 refills | Status: AC

## 2023-09-08 NOTE — Telephone Encounter (Signed)
  Chief Complaint: cold sore Symptoms: cold sore on top lip in middle, pea sized, painful and red Frequency: yesterday Pertinent Negatives: NA Disposition: [] ED /[] Urgent Care (no appt availability in office) / [] Appointment(In office/virtual)/ []  [place] [facility]/ [] Home Care/ [] Refused Recommended Disposition /[] [place] [facility]/ [x]  Follow-up with PCP Additional Notes: pt states she got cold sore on top lip, has been using Abreva and Lysine but not helping. Last episode was 04/07/22, Valtrex was sent to pharmacy for pt. Pt is asking if that can be sent in again since it does help with sx. Advised pt I would send message back to provider for review and her Mychart message has been sent to provider as well. Care advice given and pt verbalized understanding.   ----- Message from Phill Myron sent at 09/08/2023  9:44 AM EST -----  Pt is having a cold sore outbreak and would like to take a medication that was given to her last year.  Patient can not remember the name of the med   Reason for Disposition  [1] Herpes sores are a recurrent problem AND [2] caller wants a prescription medicine to take the next time they occur  Answer Assessment - Initial Assessment Questions 1. APPEARANCE of BLISTERS: "Describe the sores."     Red and painful  2. SIZE: "How large an area is involved with the cold sores?" (e.g., inches, cm or compare to coins)     Pea sized  3. LOCATION: "Which part of the lip is involved?"     Top lip in middle  4. ONSET: "When did the fever blisters begin?"     Yesterday  5. RECURRENT BLISTERS: "Have you had fever blisters before?" If Yes, ask: "When was the last time?" "How many times a year?"     Yes 04/07/22  6. OTHER SYMPTOMS: "Do you have any other symptoms?" (e.g., fever, sores inside mouth)     no  Protocols used: Cold Sores (Fever Blisters)-A-AH

## 2023-09-08 NOTE — Addendum Note (Signed)
Addended by: Dorcas Carrow on: 09/08/2023 11:59 AM   Modules accepted: Orders

## 2023-09-08 NOTE — Telephone Encounter (Signed)
Valtrex sent to her pharmacy

## 2023-09-14 ENCOUNTER — Encounter: Payer: Self-pay | Admitting: Family Medicine

## 2023-09-15 ENCOUNTER — Ambulatory Visit: Payer: 59 | Admitting: Obstetrics and Gynecology

## 2023-09-15 ENCOUNTER — Encounter: Payer: Self-pay | Admitting: Obstetrics and Gynecology

## 2023-09-15 VITALS — BP 118/77 | HR 75 | Ht 65.0 in | Wt 270.0 lb

## 2023-09-15 DIAGNOSIS — Z01419 Encounter for gynecological examination (general) (routine) without abnormal findings: Secondary | ICD-10-CM

## 2023-09-15 DIAGNOSIS — Z7689 Persons encountering health services in other specified circumstances: Secondary | ICD-10-CM

## 2023-09-15 DIAGNOSIS — Z3169 Encounter for other general counseling and advice on procreation: Secondary | ICD-10-CM

## 2023-09-15 DIAGNOSIS — Z8041 Family history of malignant neoplasm of ovary: Secondary | ICD-10-CM | POA: Insufficient documentation

## 2023-09-15 NOTE — Progress Notes (Signed)
Dorcas Carrow, DO   Chief Complaint  Patient presents with   Establish Care    No concerns    HPI:      Ms. Alexa Diaz is a 32 y.o. [id number] whose LMP was Patient's last menstrual period was 08/22/2023 (exact date)., presents today for NP establish care. Had annual physical with PCP but no breast/Gyn exam done per pt. Menses are monthly, lasting 5 days, mod flow, no BTB, mild dysmen and Mittleschmerz pain, improved with NSAIDs prn.  She is sexually active, no pain/bleeding. Using withdrawal. Had done OCPs for many yrs. Conceived on them last yr with [facility]. Pt doesn't want BC anymore, conception ok. Not taking folic acid supp. Neg pap/neg HPV DNA 4/22 FH ovarian cancer in her MGM and breast cancer in her mat grt aunt. 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   Morbid obesity (HCC) 11/17/2016   Urticaria 07/10/2015   Leg length discrepancy 06/11/2015   Herpes simplex virus infection    Allergic rhinitis    Chronic post-traumatic stress disorder (PTSD)     Past Surgical History:  Procedure Laterality Date   DILATION AND EVACUATION N/A 09/17/2022   Procedure: SUCTION DILATATION AND CURETTAGE;  Surgeon: Suzy Bouchard, MD;  Location: [facility] ORS;  Service: Gynecology;  Laterality: N/A;   WISDOM TOOTH EXTRACTION      Family History  Problem Relation Age of Onset   Prostate cancer Father        dx late 43s   Alcohol abuse Father    Arthritis Father    Hyperlipidemia Father    Hypertension Father    Thyroid disease Brother    Ovarian cancer Maternal Grandmother        late 60s   Kidney cancer Maternal Grandmother    Lung cancer Maternal Grandmother    Breast cancer Other        unknown age   Ovarian cancer Other        unknown age   Melanoma Other     Social History    Socioeconomic History   Marital status: Divorced    Spouse name: Not on file   Number of children: Not on file   Years of education: Not on file   Highest education level: Not on file  Occupational History   Not on file  Tobacco Use   Smoking status: Former    Current packs/day: 0.00    Types: Cigarettes    Quit date: 04/15/2016    Years since quitting: 7.4   Smokeless tobacco: Never  Vaping Use   Vaping status: Every Day  Substance and Sexual Activity   Alcohol use: Yes    Comment: rare   Drug use: Not Currently   Sexual activity: Yes    Birth control/protection: None  Other Topics Concern   Not on file  Social History Narrative   Not on file   Social Determinants of Health   Financial Resource Strain: Not on file  Food Insecurity: Not on file  Transportation Needs: Not on file  Physical Activity: Not on file  Stress: Not on file  Social Connections: Not on file  Intimate Partner Violence: Not on file    Outpatient Medications Prior to Visit  Medication Sig Dispense Refill   albuterol (VENTOLIN HFA) 108 (90  Base) MCG/ACT inhaler Inhale 2 puffs into the lungs every 6 (six) hours as needed for wheezing or shortness of breath. 8 g 0   naproxen (NAPROSYN) 500 MG tablet Take 500 mg by mouth as needed.     valACYclovir (VALTREX) 1000 MG tablet Take 1 tablet (1,000 mg total) by mouth 2 (two) times daily for 10 days. 20 tablet 0   cetirizine (ZYRTEC) 10 MG tablet Take 1 tablet (10 mg total) by mouth daily as needed for allergies. 30 tablet 1   mupirocin ointment (BACTROBAN) 2 % Apply 1 Application topically 2 (two) times daily. 22 g 0   No facility-administered medications prior to visit.      ROS:  Review of Systems  Constitutional:  Negative for fever.  Gastrointestinal:  Negative for blood in stool, constipation, diarrhea, nausea and vomiting.  Genitourinary:  Negative for dyspareunia, dysuria, flank pain, frequency, hematuria, urgency, vaginal bleeding,  vaginal discharge and vaginal pain.  Musculoskeletal:  Negative for back pain.  Skin:  Negative for rash.   BREAST: No symptoms   OBJECTIVE:   Vitals:  BP 118/77   Pulse 75   Ht 5\' 5"  (1.651 m)   Wt 270 lb (122.5 kg)   LMP 08/22/2023 (Exact Date)   Breastfeeding No   BMI 44.93 kg/m   Physical Exam Vitals reviewed.  Constitutional:      Appearance: She is well-developed.  Pulmonary:     Effort: Pulmonary effort is normal.  Chest:  Breasts:    Breasts are symmetrical.     Right: No inverted nipple, mass, nipple discharge, skin change or tenderness.     Left: No inverted nipple, mass, nipple discharge, skin change or tenderness.  Genitourinary:    General: Normal vulva.     Pubic Area: No rash.      Labia:        Right: No rash, tenderness or lesion.        Left: No rash, tenderness or lesion.      Vagina: Normal. No vaginal discharge, erythema or tenderness.     Cervix: Normal.     Uterus: Normal. Not enlarged and not tender.      Adnexa: Right adnexa normal and left adnexa normal.       Right: No mass or tenderness.         Left: No mass or tenderness.    Musculoskeletal:        General: Normal range of motion.     Cervical back: Normal range of motion.  Skin:    General: Skin is warm and dry.  Neurological:     General: No focal deficit present.     Mental Status: She is alert and oriented to person, place, and time.     Cranial Nerves: No cranial nerve deficit.  Psychiatric:        Mood and Affect: Mood normal.        Behavior: Behavior normal.        Thought Content: Thought content normal.        Judgment: Judgment normal.     Assessment/Plan: Encounter to establish care  Family history of ovarian cancer--pt is Invitae neg. No further screening recommendations at this time.   Normal gynecologic examination  Pre-conception counseling--start folic acid supp or PNVs. Add Vit D supp. F/u prn.     Return if symptoms worsen or fail to  improve.  Kamry Faraci B. [employer], PA-C 09/15/2023 4:12 PM

## 2023-09-15 NOTE — Patient Instructions (Signed)
I value your feedback and you entrusting us with your care. If you get a [facility] patient survey, I would appreciate you taking the time to let us know about your experience today. Thank you! ? ? ?

## 2023-10-19 ENCOUNTER — Ambulatory Visit: Payer: 59 | Admitting: Family Medicine

## 2023-10-22 ENCOUNTER — Ambulatory Visit: Payer: 59 | Admitting: Family Medicine

## 2023-11-10 ENCOUNTER — Encounter: Payer: Self-pay | Admitting: Family Medicine

## 2023-11-10 ENCOUNTER — Ambulatory Visit: Payer: 59 | Admitting: Family Medicine

## 2023-11-10 MED ORDER — METFORMIN HCL ER 500 MG PO TB24: ORAL_TABLET | ORAL | 2 refills | Status: DC

## 2023-11-10 NOTE — Progress Notes (Signed)
 BP 114/78 (BP Location: Right Arm, Patient Position: Sitting, Cuff Size: Large)   Pulse 81   Temp 98.1 F (36.7 C) (Oral)   Ht 5' 5 (1.651 m)   Wt 264 lb 9.6 oz (120 kg)   SpO2 99%   BMI 44.03 kg/m    Subjective:    Patient ID: Alexa Diaz, female    DOB: 19-Apr-1991, 33 y.o.   MRN: [id number]  HPI: Alexa Diaz is a 33 y.o. female  Chief Complaint  Patient presents with   Obesity   OBESITY Duration: chronic Previous attempts at weight loss: yes Complications of obesity: low back pain Peak weight: 270lbs Weight loss goal: needs to be down 220-230 to have surgery Weight loss to date: 6lbs Requesting obesity pharmacotherapy: yes Current weight loss supplements/medications: no Previous weight loss supplements/meds: no  Relevant past medical, surgical, family and social history reviewed and updated as indicated. Interim medical history since our last visit reviewed. Allergies and medications reviewed and updated.  Review of Systems  Constitutional: Negative.   Respiratory: Negative.    Cardiovascular: Negative.   Musculoskeletal: Negative.   Neurological: Negative.   Psychiatric/Behavioral: Negative.      Per HPI unless specifically indicated above     Objective:    BP 114/78 (BP Location: Right Arm, Patient Position: Sitting, Cuff Size: Large)   Pulse 81   Temp 98.1 F (36.7 C) (Oral)   Ht 5' 5 (1.651 m)   Wt 264 lb 9.6 oz (120 kg)   SpO2 99%   BMI 44.03 kg/m   Wt Readings from Last 3 Encounters:  11/10/23 264 lb 9.6 oz (120 kg)  09/15/23 270 lb (122.5 kg)  08/17/23 266 lb (120.7 kg)    Physical Exam Vitals and nursing note reviewed.  Constitutional:      General: She is not in acute distress.    Appearance: Normal appearance. She is obese. She is not ill-appearing, toxic-appearing or diaphoretic.  HENT:     Head: Normocephalic and atraumatic.     Right Ear: External ear normal.     Left Ear: External ear normal.     Nose: Nose normal.      Mouth/Throat:     Mouth: Mucous membranes are moist.     Pharynx: Oropharynx is clear.  Eyes:     General: No scleral icterus.       Right eye: No discharge.        Left eye: No discharge.     Extraocular Movements: Extraocular movements intact.     Conjunctiva/sclera: Conjunctivae normal.     Pupils: Pupils are equal, round, and reactive to light.  Cardiovascular:     Rate and Rhythm: Normal rate and regular rhythm.     Pulses: Normal pulses.     Heart sounds: Normal heart sounds. No murmur heard.    No friction rub. No gallop.  Pulmonary:     Effort: Pulmonary effort is normal. No respiratory distress.     Breath sounds: Normal breath sounds. No stridor. No wheezing, rhonchi or rales.  Chest:     Chest wall: No tenderness.  Musculoskeletal:        General: Normal range of motion.     Cervical back: Normal range of motion and neck supple.  Skin:    General: Skin is warm and dry.     Capillary Refill: Capillary refill takes less than 2 seconds.     Coloration: Skin is not jaundiced or pale.     Findings:  No bruising, erythema, lesion or rash.  Neurological:     General: No focal deficit present.     Mental Status: She is alert and oriented to person, place, and time. Mental status is at baseline.  Psychiatric:        Mood and Affect: Mood normal.        Behavior: Behavior normal.        Thought Content: Thought content normal.        Judgment: Judgment normal.     Results for orders placed or performed in visit on 08/17/23  CBC with Differential/Platelet   Collection Time: 08/17/23  3:02 PM  Result Value Ref Range   WBC 6.5 3.4 - 10.8 x10E3/uL   RBC 4.28 3.77 - 5.28 x10E6/uL   Hemoglobin 11.7 11.1 - 15.9 g/dL   Hematocrit 63.4 65.9 - 46.6 %   MCV 85 79 - 97 fL   MCH 27.3 26.6 - 33.0 pg   MCHC 32.1 31.5 - 35.7 g/dL   RDW 85.7 88.2 - 84.5 %   Platelets 205 150 - 450 x10E3/uL   Neutrophils 63 Not Estab. %   Lymphs 30 Not Estab. %   Monocytes 6 Not Estab. %   Eos 1  Not Estab. %   Basos 0 Not Estab. %   Neutrophils Absolute 4.1 1.4 - 7.0 x10E3/uL   Lymphocytes Absolute 2.0 0.7 - 3.1 x10E3/uL   Monocytes Absolute 0.4 0.1 - 0.9 x10E3/uL   EOS (ABSOLUTE) 0.1 0.0 - 0.4 x10E3/uL   Basophils Absolute 0.0 0.0 - 0.2 x10E3/uL   Immature Granulocytes 0 Not Estab. %   Immature Grans (Abs) 0.0 0.0 - 0.1 x10E3/uL  Comprehensive metabolic panel   Collection Time: 08/17/23  3:02 PM  Result Value Ref Range   Glucose 81 70 - 99 mg/dL   BUN 11 6 - 20 mg/dL   Creatinine, Ser 9.25 0.57 - 1.00 mg/dL   eGFR 889 >40 fO/fpw/8.26   BUN/Creatinine Ratio 15 9 - 23   Sodium 144 134 - 144 mmol/L   Potassium 3.9 3.5 - 5.2 mmol/L   Chloride 108 (H) 96 - 106 mmol/L   CO2 23 20 - 29 mmol/L   Calcium 9.2 8.7 - 10.2 mg/dL   Total Protein 6.3 6.0 - 8.5 g/dL   Albumin 4.2 3.9 - 4.9 g/dL   Globulin, Total 2.1 1.5 - 4.5 g/dL   Bilirubin Total 0.2 0.0 - 1.2 mg/dL   Alkaline Phosphatase 59 44 - 121 IU/L   AST 13 0 - 40 IU/L   ALT 9 0 - 32 IU/L  Lipid Panel w/o Chol/HDL Ratio   Collection Time: 08/17/23  3:02 PM  Result Value Ref Range   Cholesterol, Total 185 100 - 199 mg/dL   Triglycerides 856 0 - 149 mg/dL   HDL 39 (L) >60 mg/dL   VLDL Cholesterol Cal 26 5 - 40 mg/dL   LDL Chol Calc (NIH) 879 (H) 0 - 99 mg/dL  TSH   Collection Time: 08/17/23  3:02 PM  Result Value Ref Range   TSH 0.528 0.450 - 4.500 uIU/mL  Hepatitis C Antibody   Collection Time: 08/17/23  3:02 PM  Result Value Ref Range   Hep C Virus Ab Non Reactive Non Reactive      Assessment & Plan:   Problem List Items Addressed This Visit       Other   Morbid obesity (HCC) - Primary   Congratulated patient on 6lb weight loss. We will start metformin   and recheck in 1 month. Goal of losing 1 lb a week. Call with any concerns.       Relevant Medications   metFORMIN  (GLUCOPHAGE -XR) 500 MG 24 hr tablet     Follow up plan: Return in about 4 weeks (around 12/08/2023).

## 2023-11-10 NOTE — Assessment & Plan Note (Signed)
 Congratulated patient on 6lb weight loss. We will start metformin and recheck in 1 month. Goal of losing 1 lb a week. Call with any concerns.

## 2023-11-18 ENCOUNTER — Ambulatory Visit: Payer: Self-pay
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
## 2023-11-24 ENCOUNTER — Encounter: Payer: Self-pay | Admitting: Family Medicine

## 2023-11-24 ENCOUNTER — Ambulatory Visit: Payer: 59 | Admitting: Family Medicine

## 2023-11-24 ENCOUNTER — Ambulatory Visit: Payer: 59 | Attending: Family Medicine

## 2023-11-24 VITALS — BP 126/84 | HR 68 | Temp 98.3°F | Wt 263.4 lb

## 2023-11-24 DIAGNOSIS — R55 Syncope and collapse: Secondary | ICD-10-CM | POA: Diagnosis not present

## 2023-11-24 DIAGNOSIS — S060X1D Concussion with loss of consciousness of 30 minutes or less, subsequent encounter: Secondary | ICD-10-CM | POA: Diagnosis not present

## 2023-11-24 DIAGNOSIS — F419 Anxiety disorder, unspecified: Secondary | ICD-10-CM | POA: Diagnosis not present

## 2023-11-24 LAB — URINALYSIS, ROUTINE W REFLEX MICROSCOPIC
Bilirubin, UA: NEGATIVE
Glucose, UA: NEGATIVE
Ketones, UA: NEGATIVE
Leukocytes,UA: NEGATIVE
Nitrite, UA: NEGATIVE
Protein,UA: NEGATIVE
RBC, UA: NEGATIVE
Specific Gravity, UA: >1.03 — ABNORMAL HIGH (ref 1.005–1.030)
Urobilinogen, Ur: 0.2 mg/dL (ref 0.2–1.0)
pH, UA: 5.5 (ref 5.0–7.5)

## 2023-11-24 MED ORDER — CLONAZEPAM 0.5 MG PO TABS: 0.5000 mg | ORAL_TABLET | Freq: Two times a day (BID) | ORAL | 1 refills | Status: DC | PRN

## 2023-11-24 MED ORDER — ESCITALOPRAM OXALATE 5 MG PO TABS: 5.0000 mg | ORAL_TABLET | Freq: Every day | ORAL | 2 refills | Status: DC

## 2023-11-24 NOTE — Assessment & Plan Note (Signed)
In exacerbation. Will get her restarted on lexapro. Rx for PRN klonopin given today. Recheck in about 4-6 weeks. Call with any concerns.
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
## 2023-11-26 DIAGNOSIS — R55 Syncope and collapse: Secondary | ICD-10-CM

## 2023-12-14 ENCOUNTER — Ambulatory Visit: Payer: 59 | Admitting: Family Medicine

## 2023-12-14 ENCOUNTER — Encounter: Payer: Self-pay | Admitting: Family Medicine

## 2023-12-14 DIAGNOSIS — F419 Anxiety disorder, unspecified: Secondary | ICD-10-CM | POA: Diagnosis not present

## 2023-12-14 NOTE — Assessment & Plan Note (Signed)
Starting to notice a difference. Tolerating her medicine well. No concerns. Recheck 3 weeks as scheduled.

## 2023-12-14 NOTE — Progress Notes (Signed)
BP 112/70 (BP Location: Right Arm, Patient Position: Sitting, Cuff Size: Normal)   Pulse 89   Temp 97.9 F (36.6 C) (Oral)   Ht 5\' 5"  (1.651 m)   Wt 261 lb 9.6 oz (118.7 kg)   LMP 12/04/2023 (Exact Date)   SpO2 98%   BMI 43.53 kg/m    Subjective:    Patient ID: Alexa Diaz, female    DOB: 1991-05-11, 33 y.o.   MRN: [id number]  HPI: Alexa Diaz is a 33 y.o. female  Chief Complaint  Patient presents with   Anxiety    Patient states last week starting Sunday upset stomach and nauseous  She think it maybe the med   OBESITY Duration: Chronic Previous attempts at weight loss: yes Peak weight: 273 Weight loss goal: needs to be down 220-230 to have surgery  Weight loss to date: 12lbs! Requesting obesity pharmacotherapy: yes Current weight loss supplements/medications: yes Previous weight loss supplements/meds: no  ANXIETY/STRESS Duration: chronic Status:very slightly better Anxious mood: yes  Excessive worrying: yes Irritability: yes  Sweating: no Nausea: no Palpitations:no Hyperventilation: no Panic attacks: no Agoraphobia: no  Obscessions/compulsions: no Depressed mood: no    12/14/2023    4:27 PM 11/24/2023    8:47 AM 11/10/2023    4:32 PM 08/17/2023    2:51 PM 03/05/2023   10:24 AM  Depression screen PHQ 2/9  Decreased Interest 0 0 1 1 1   Down, Depressed, Hopeless 0 0 0 0 0  PHQ - 2 Score 0 0 1 1 1   Altered sleeping 1 0 0 0 0  Tired, decreased energy 1 1 1 1 1   Change in appetite 0 1 0 0 1  Feeling bad or failure about yourself  0 0 0 0 0  Trouble concentrating 0 0 1 1 0  Moving slowly or fidgety/restless 0 0 0 0 0  Suicidal thoughts 0 0 0 0 0  PHQ-9 Score 2 2 3 3 3   Difficult doing work/chores Not difficult at all Not difficult at all   Not difficult at all   Anhedonia: no Weight changes: yes Insomnia: yes   Hypersomnia: no Fatigue/loss of energy: no Feelings of worthlessness: no Feelings of guilt: no Impaired concentration/indecisiveness:  no Suicidal ideations: no  Crying spells: no Recent Stressors/Life Changes: no   Relationship problems: no   Family stress: no     Financial stress: no    Job stress: no    Recent death/loss: no  Relevant past medical, surgical, family and social history reviewed and updated as indicated. Interim medical history since our last visit reviewed. Allergies and medications reviewed and updated.  Review of Systems  Constitutional: Negative.   Respiratory: Negative.    Cardiovascular: Negative.   Musculoskeletal: Negative.   Psychiatric/Behavioral: Negative.      Per HPI unless specifically indicated above     Objective:    BP 112/70 (BP Location: Right Arm, Patient Position: Sitting, Cuff Size: Normal)   Pulse 89   Temp 97.9 F (36.6 C) (Oral)   Ht 5\' 5"  (1.651 m)   Wt 261 lb 9.6 oz (118.7 kg)   LMP 12/04/2023 (Exact Date)   SpO2 98%   BMI 43.53 kg/m   Wt Readings from Last 3 Encounters:  12/14/23 261 lb 9.6 oz (118.7 kg)  11/24/23 263 lb 6.4 oz (119.5 kg)  11/10/23 264 lb 9.6 oz (120 kg)    Physical Exam Vitals and nursing note reviewed.  Constitutional:      General: She is  not in acute distress.    Appearance: Normal appearance. She is obese. She is not ill-appearing, toxic-appearing or diaphoretic.  HENT:     Head: Normocephalic and atraumatic.     Right Ear: External ear normal.     Left Ear: External ear normal.     Nose: Nose normal.     Mouth/Throat:     Mouth: Mucous membranes are moist.     Pharynx: Oropharynx is clear.  Eyes:     General: No scleral icterus.       Right eye: No discharge.        Left eye: No discharge.     Extraocular Movements: Extraocular movements intact.     Conjunctiva/sclera: Conjunctivae normal.     Pupils: Pupils are equal, round, and reactive to light.  Cardiovascular:     Rate and Rhythm: Normal rate and regular rhythm.     Pulses: Normal pulses.     Heart sounds: Normal heart sounds. No murmur heard.    No friction rub.  No gallop.  Pulmonary:     Effort: Pulmonary effort is normal. No respiratory distress.     Breath sounds: Normal breath sounds. No stridor. No wheezing, rhonchi or rales.  Chest:     Chest wall: No tenderness.  Musculoskeletal:        General: Normal range of motion.     Cervical back: Normal range of motion and neck supple.  Skin:    General: Skin is warm and dry.     Capillary Refill: Capillary refill takes less than 2 seconds.     Coloration: Skin is not jaundiced or pale.     Findings: No bruising, erythema, lesion or rash.  Neurological:     General: No focal deficit present.     Mental Status: She is alert and oriented to person, place, and time. Mental status is at baseline.  Psychiatric:        Mood and Affect: Mood normal.        Behavior: Behavior normal.        Thought Content: Thought content normal.        Judgment: Judgment normal.     Results for orders placed or performed in visit on 11/24/23  Urinalysis, Routine w reflex microscopic   Collection Time: 11/24/23  9:09 AM  Result Value Ref Range   Specific Gravity, UA >1.030 (H) 1.005 - 1.030   pH, UA 5.5 5.0 - 7.5   Color, UA Yellow Yellow   Appearance Ur Hazy (A) Clear   Leukocytes,UA Negative Negative   Protein,UA Negative Negative/Trace   Glucose, UA Negative Negative   Ketones, UA Negative Negative   RBC, UA Negative Negative   Bilirubin, UA Negative Negative   Urobilinogen, Ur 0.2 0.2 - 1.0 mg/dL   Nitrite, UA Negative Negative   Microscopic Examination Comment       Assessment & Plan:   Problem List Items Addressed This Visit       Other   Morbid obesity (HCC) - Primary   Congratulated patient on 12lb weight loss! Has been a little nauseous, cut back to 3 pills of metformin a day for 1-3 weeks and then try to go back to the 4 pills a day.       Anxiety   Starting to notice a difference. Tolerating her medicine well. No concerns. Recheck 3 weeks as scheduled.         Follow up  plan: Return for As scheduled.

## 2023-12-14 NOTE — Assessment & Plan Note (Signed)
Congratulated patient on 12lb weight loss! Has been a little nauseous, cut back to 3 pills of metformin a day for 1-3 weeks and then try to go back to the 4 pills a day.

## 2024-01-05 ENCOUNTER — Encounter: Payer: Self-pay | Admitting: Family Medicine

## 2024-01-05 ENCOUNTER — Ambulatory Visit: Payer: 59 | Admitting: Family Medicine

## 2024-01-05 VITALS — BP 112/73 | HR 56 | Temp 98.2°F | Resp 17 | Ht 65.0 in | Wt 259.2 lb

## 2024-01-05 DIAGNOSIS — F4312 Post-traumatic stress disorder, chronic: Secondary | ICD-10-CM | POA: Diagnosis not present

## 2024-01-05 MED ORDER — ESCITALOPRAM OXALATE 5 MG PO TABS: 5.0000 mg | ORAL_TABLET | Freq: Every day | ORAL | 1 refills | Status: DC

## 2024-01-05 MED ORDER — METFORMIN HCL ER 500 MG PO TB24: ORAL_TABLET | ORAL | 1 refills | Status: DC

## 2024-01-05 NOTE — Progress Notes (Signed)
 BP 112/73 (BP Location: Left Arm, Patient Position: Sitting, Cuff Size: Large)   Pulse (!) 56   Temp 98.2 F (36.8 C) (Oral)   Resp 17   Ht 5\' 5"  (1.651 m)   Wt 259 lb 3.2 oz (117.6 kg)   LMP 12/04/2023 (Exact Date)   SpO2 99%   BMI 43.13 kg/m    Subjective:    Patient ID: Alexa Diaz, female    DOB: 30-Apr-1991, 33 y.o.   MRN: [id number]  HPI: Alexa Diaz is a 33 y.o. female  Chief Complaint  Patient presents with   Heart Problem    Follow up on holter monitor results. NO changes    ANXIETY/DEPRESSION Duration: chronic Status:better Anxious mood: yes  Excessive worrying: yes Irritability: no  Sweating: no Nausea: no Palpitations:no Hyperventilation: no Panic attacks: no Agoraphobia: no  Obscessions/compulsions: no Depressed mood: no    12/14/2023    4:27 PM 11/24/2023    8:47 AM 11/10/2023    4:32 PM 08/17/2023    2:51 PM 03/05/2023   10:24 AM  Depression screen PHQ 2/9  Decreased Interest 0 0 1 1 1   Down, Depressed, Hopeless 0 0 0 0 0  PHQ - 2 Score 0 0 1 1 1   Altered sleeping 1 0 0 0 0  Tired, decreased energy 1 1 1 1 1   Change in appetite 0 1 0 0 1  Feeling bad or failure about yourself  0 0 0 0 0  Trouble concentrating 0 0 1 1 0  Moving slowly or fidgety/restless 0 0 0 0 0  Suicidal thoughts 0 0 0 0 0  PHQ-9 Score 2 2 3 3 3   Difficult doing work/chores Not difficult at all Not difficult at all   Not difficult at all   Anhedonia: no Weight changes: yes Insomnia: yes   Hypersomnia: no Fatigue/loss of energy: no Feelings of worthlessness: no Feelings of guilt: no Impaired concentration/indecisiveness: no Suicidal ideations: no  Crying spells: no Recent Stressors/Life Changes: no   Relationship problems: no   Family stress: no     Financial stress: no    Job stress: no    Recent death/loss: no  OBESITY Duration: chronic Previous attempts at weight loss: yes Complications of obesity: yes Peak weight:  273lbs Weight loss goal: needs  to be down 220-230 to have surgery Weight loss to date: 14lbs Requesting obesity pharmacotherapy: yes Current weight loss supplements/medications: yes Previous weight loss supplements/meds: yes  Relevant past medical, surgical, family and social history reviewed and updated as indicated. Interim medical history since our last visit reviewed. Allergies and medications reviewed and updated.  Review of Systems  Constitutional: Negative.   Respiratory: Negative.    Cardiovascular: Negative.   Gastrointestinal: Negative.   Musculoskeletal: Negative.   Neurological: Negative.   Psychiatric/Behavioral: Negative.      Per HPI unless specifically indicated above     Objective:    BP 112/73 (BP Location: Left Arm, Patient Position: Sitting, Cuff Size: Large)   Pulse (!) 56   Temp 98.2 F (36.8 C) (Oral)   Resp 17   Ht 5\' 5"  (1.651 m)   Wt 259 lb 3.2 oz (117.6 kg)   LMP 12/04/2023 (Exact Date)   SpO2 99%   BMI 43.13 kg/m   Wt Readings from Last 3 Encounters:  01/05/24 259 lb 3.2 oz (117.6 kg)  12/14/23 261 lb 9.6 oz (118.7 kg)  11/24/23 263 lb 6.4 oz (119.5 kg)    Physical Exam Vitals and  nursing note reviewed.  Constitutional:      General: She is not in acute distress.    Appearance: Normal appearance. She is obese. She is not ill-appearing, toxic-appearing or diaphoretic.  HENT:     Head: Normocephalic and atraumatic.     Right Ear: External ear normal.     Left Ear: External ear normal.     Nose: Nose normal.     Mouth/Throat:     Mouth: Mucous membranes are moist.     Pharynx: Oropharynx is clear.  Eyes:     General: No scleral icterus.       Right eye: No discharge.        Left eye: No discharge.     Extraocular Movements: Extraocular movements intact.     Conjunctiva/sclera: Conjunctivae normal.     Pupils: Pupils are equal, round, and reactive to light.  Cardiovascular:     Rate and Rhythm: Normal rate and regular rhythm.     Pulses: Normal pulses.      Heart sounds: Normal heart sounds. No murmur heard.    No friction rub. No gallop.  Pulmonary:     Effort: Pulmonary effort is normal. No respiratory distress.     Breath sounds: Normal breath sounds. No stridor. No wheezing, rhonchi or rales.  Chest:     Chest wall: No tenderness.  Musculoskeletal:        General: Normal range of motion.     Cervical back: Normal range of motion and neck supple.  Skin:    General: Skin is warm and dry.     Capillary Refill: Capillary refill takes less than 2 seconds.     Coloration: Skin is not jaundiced or pale.     Findings: No bruising, erythema, lesion or rash.  Neurological:     General: No focal deficit present.     Mental Status: She is alert and oriented to person, place, and time. Mental status is at baseline.  Psychiatric:        Mood and Affect: Mood normal.        Behavior: Behavior normal.        Thought Content: Thought content normal.        Judgment: Judgment normal.     Results for orders placed or performed in visit on 11/24/23  Urinalysis, Routine w reflex microscopic   Collection Time: 11/24/23  9:09 AM  Result Value Ref Range   Specific Gravity, UA >1.030 (H) 1.005 - 1.030   pH, UA 5.5 5.0 - 7.5   Color, UA Yellow Yellow   Appearance Ur Hazy (A) Clear   Leukocytes,UA Negative Negative   Protein,UA Negative Negative/Trace   Glucose, UA Negative Negative   Ketones, UA Negative Negative   RBC, UA Negative Negative   Bilirubin, UA Negative Negative   Urobilinogen, Ur 0.2 0.2 - 1.0 mg/dL   Nitrite, UA Negative Negative   Microscopic Examination Comment       Assessment & Plan:   Problem List Items Addressed This Visit       Other   Chronic post-traumatic stress disorder (PTSD) - Primary   Under good control on current regimen. Continue current regimen. Continue to monitor. Call with any concerns. Refills given.        Relevant Medications   escitalopram (LEXAPRO) 5 MG tablet   Morbid obesity (HCC)    Congratulated patient on continued weight loss. Continue diet and exercise and metformin. Recheck 3 months. Call with any concerns.  Relevant Medications   metFORMIN (GLUCOPHAGE-XR) 500 MG 24 hr tablet     Follow up plan: Return in about 3 months (around 04/06/2024).

## 2024-01-05 NOTE — Assessment & Plan Note (Signed)
 Under good control on current regimen. Continue current regimen. Continue to monitor. Call with any concerns. Refills given.

## 2024-01-05 NOTE — Assessment & Plan Note (Signed)
 Congratulated patient on continued weight loss. Continue diet and exercise and metformin. Recheck 3 months. Call with any concerns.

## 2024-01-18 ENCOUNTER — Other Ambulatory Visit: Payer: Self-pay

## 2024-01-18 ENCOUNTER — Ambulatory Visit
Admission: EM | Admit: 2024-01-18 | Discharge: 2024-01-18 | Disposition: A | Discharge status: Home / Self Care | Attending: Emergency Medicine | Admitting: Emergency Medicine

## 2024-01-18 ENCOUNTER — Encounter: Payer: Self-pay | Admitting: Emergency Medicine

## 2024-01-18 DIAGNOSIS — J0140 Acute pansinusitis, unspecified: Secondary | ICD-10-CM | POA: Diagnosis not present

## 2024-01-18 LAB — POCT RAPID STREP A (OFFICE): Rapid Strep A Screen: NEGATIVE

## 2024-01-18 MED ORDER — PREDNISONE 10 MG (21) PO TBPK: ORAL_TABLET | Freq: Every day | ORAL | 0 refills | Status: DC

## 2024-01-18 MED ORDER — AZITHROMYCIN 250 MG PO TABS: 250.0000 mg | ORAL_TABLET | Freq: Every day | ORAL | 0 refills | Status: DC

## 2024-01-18 NOTE — ED Triage Notes (Addendum)
 Symptoms started Saturday evening.  Patient went to a funeral.  Also, pollen was prevalent in the area they went to.  Initially throat scratchy and sore.  Secretions from sinus is green and yellow.has not run a fever.  Pressure behind eyes and intermittent head aches  Has taken a "mucinex nasal spray"

## 2024-01-18 NOTE — ED Provider Notes (Signed)
 [name]    CSN: [id number] Arrival date & time: 01/18/24  1106      History   Chief Complaint Chief Complaint  Patient presents with   sinus congestion    HPI Alexa Diaz is a 33 y.o. female.   Presents for evaluation of chills, nasal congestion, rhinorrhea, sinus pressure across the entire face, a scratchy sore throat and a productive cough present for 2 days.  Associated intermittent headaches.  Symptoms began while at large gathering in a heavily pollinating environment.  Unsure of seasonal allergies or infection.  Able to tolerate food and liquids.  Has been using Mucinex nasal spray.  Denies fever.  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    Patient Active Problem List   Diagnosis Date Noted   Family history of ovarian cancer 09/15/2023   Chronic constipation 07/15/2022   Anxiety 02/18/2022   Cold sore 01/31/2021   Morbid obesity (HCC) 11/17/2016   Urticaria 07/10/2015   Leg length discrepancy 06/11/2015   Herpes simplex virus infection    Allergic rhinitis    Chronic post-traumatic stress disorder (PTSD)     Past Surgical History:  Procedure Laterality Date   DILATION AND EVACUATION N/A 09/17/2022   Procedure: SUCTION DILATATION AND CURETTAGE;  Surgeon: Suzy Bouchard, MD;  Location: [facility] ORS;  Service: Gynecology;  Laterality: N/A;   WISDOM TOOTH EXTRACTION      OB History     Gravida  2   Para  1   Term  1   Preterm  0   AB  1   Living  1      SAB  1   IAB      Ectopic      Multiple      Live Births  1            Home Medications     Prior to Admission medications   Medication Sig Start Date End Date Taking? Authorizing Provider  azithromycin (ZITHROMAX) 250 MG tablet Take 1 tablet (250 mg total) by mouth daily. Take first 2 tablets together, then 1 every day until finished. 01/24/24  Yes Latosha Gaylord R, NP  clonazePAM (KLONOPIN) 0.5 MG tablet Take 1 tablet (0.5 mg total) by mouth 2 (two) times daily as needed for anxiety. 11/24/23  Yes Johnson, Megan P, DO  predniSONE (STERAPRED UNI-PAK 21 TAB) 10 MG (21) TBPK tablet Take by mouth daily. Take 6 tabs by mouth daily  for 1 days, then 5 tabs for 1 days, then 4 tabs for 1 days, then 3 tabs for 1 days, 2 tabs for 1 days, then 1 tab by mouth daily for 1 days 01/18/24  Yes Abbas Beyene R, NP  escitalopram (LEXAPRO) 5 MG tablet Take 1 tablet (5 mg total) by mouth daily. 01/05/24   Johnson, Megan P, DO  metFORMIN (GLUCOPHAGE-XR) 500 MG 24 hr tablet 1 tab in the AM for 1 week, then 1 pill BID for 1 week, then 2 pills in the AM and 1  pill in the PM for 1 week then 2 pills BID until you see me 01/05/24   Dorcas Carrow, DO    Family History Family History  Problem Relation Age of Onset   Prostate cancer Father        dx late 69s   Alcohol abuse Father    Arthritis Father    Hyperlipidemia Father    Hypertension Father    Thyroid disease Brother    Ovarian cancer Maternal Grandmother        late 9s   Kidney cancer Maternal Grandmother    Lung cancer Maternal Grandmother    Breast cancer Other        unknown age   Ovarian cancer Other        unknown age   Melanoma Other     Social History Social History   Tobacco Use   Smoking status: Former    Current packs/day: 0.00    Types: Cigarettes    Quit date: 04/15/2016    Years since quitting: 7.7   Smokeless tobacco: Never  Vaping Use   Vaping status: Every Day  Substance Use Topics   Alcohol use: Yes    Comment: rare   Drug use: Not Currently     Allergies   Sertraline hcl and Wellbutrin  [bupropion]   Review of Systems Review of Systems   Physical Exam Triage Vital Signs ED Triage Vitals  Encounter Vitals Group     BP 01/18/24 1204 129/87     Systolic BP Percentile --      Diastolic BP Percentile --      Pulse Rate 01/18/24 1204 (!) 56     Resp 01/18/24 1204 18     Temp 01/18/24 1204 98.4 F (36.9 C)     Temp Source 01/18/24 1204 Oral     SpO2 01/18/24 1204 98 %     Weight --      Height --      Head Circumference --      Peak Flow --      Pain Score 01/18/24 1200 4     Pain Loc --      Pain Education --      Exclude from Growth Chart --    No data found.  Updated Vital Signs BP 129/87 (BP Location: Left Arm) Comment (BP Location): large cuff  Pulse (!) 56   Temp 98.4 F (36.9 C) (Oral)   Resp 18   LMP 12/30/2023   SpO2 98%   Visual Acuity Right Eye Distance:   Left Eye Distance:   Bilateral Distance:    Right Eye Near:   Left Eye Near:    Bilateral Near:     Physical Exam Constitutional:      Appearance: Normal appearance.  HENT:     Head: Normocephalic.     Right Ear: Tympanic membrane, ear canal and external ear normal.     Left Ear: Tympanic membrane, ear canal and external ear normal.     Nose: Congestion and rhinorrhea present.     Right Sinus: Maxillary sinus tenderness and frontal sinus tenderness present.     Left Sinus: Maxillary sinus tenderness and frontal sinus tenderness present.     Mouth/Throat:     Pharynx: Posterior oropharyngeal erythema present. No oropharyngeal exudate.  Cardiovascular:     Rate and Rhythm: Normal rate and regular rhythm.     Pulses: Normal pulses.     Heart sounds: Normal heart sounds.  Pulmonary:  Effort: Pulmonary effort is normal.     Breath sounds: Normal breath sounds.  Musculoskeletal:     Cervical back: Normal range of motion and neck supple.  Neurological:     Mental Status: She is alert and oriented to person, place, and time. Mental status is at baseline.      UC Treatments  / Results  Labs (all labs ordered are listed, but only abnormal results are displayed) Labs Reviewed  POCT RAPID STREP A (OFFICE) - Normal    EKG   Radiology No results found.  Procedures Procedures (including critical care time)  Medications Ordered in UC Medications - No data to display  Initial Impression / Assessment and Plan / UC Course  I have reviewed the triage vital signs and the nursing notes.  Pertinent labs & imaging results that were available during my care of the patient were reviewed by me and considered in my medical decision making (see chart for details).  ACute nonrecurrent pansinusitis  Patient is in no signs of distress nor toxic appearing.  Vital signs are stable.  Low suspicion for pneumonia, pneumothorax or bronchitis and therefore will defer imaging.  strep test negative.  Prescribed prednisone watch wait antibiotic placed at pharmacy.May use additional over-the-counter medications as needed for supportive care.  May follow-up with urgent care as needed if symptoms persist or worsen.  Note given.   Final Clinical Impressions(s) / UC Diagnoses   Final diagnoses:  Acute non-recurrent pansinusitis     Discharge Instructions      Your symptoms today are most likely being caused by a virus and should steadily improve in time it can take up to 7 to 10 days before you truly start to see a turnaround however things will get better if no improvement seen by Sunday may begin use of azithromycin which will be available at the pharmacy  In the meantime take prednisone every morning with food to help reduce sinus pressure  Strep test is negative    You can take Tylenol and/or Ibuprofen as needed for fever reduction and pain relief.   For cough: honey 1/2 to 1 teaspoon (you can dilute the honey in water or another fluid).  You can also use guaifenesin and dextromethorphan for cough. You can use a humidifier for chest congestion and cough.  If you don't have  a humidifier, you can sit in the bathroom with the hot shower running.      For sore throat: try warm salt water gargles, cepacol lozenges, throat spray, warm tea or water with lemon/honey, popsicles or ice, or OTC cold relief medicine for throat discomfort.   For congestion: take a daily anti-histamine like Zyrtec, Claritin, and a oral decongestant, such as pseudoephedrine.  You can also use Flonase 1-2 sprays in each nostril daily.   It is important to stay hydrated: drink plenty of fluids (water, gatorade/powerade/pedialyte, juices, or teas) to keep your throat moisturized and help further relieve irritation/discomfort.    ED Prescriptions     Medication Sig Dispense Auth. Provider   predniSONE (STERAPRED UNI-PAK 21 TAB) 10 MG (21) TBPK tablet Take by mouth daily. Take 6 tabs by mouth daily  for 1 days, then 5 tabs for 1 days, then 4 tabs for 1 days, then 3 tabs for 1 days, 2 tabs for 1 days, then 1 tab by mouth daily for 1 days 21 tablet Charles Andringa R, NP   azithromycin (ZITHROMAX) 250 MG tablet Take 1 tablet (250 mg total) by mouth daily.  Take first 2 tablets together, then 1 every day until finished. 6 tablet Valinda Hoar, NP      PDMP not reviewed this encounter.   Valinda Hoar, NP 01/18/24 1234

## 2024-01-18 NOTE — Discharge Instructions (Signed)
 Your symptoms today are most likely being caused by a virus and should steadily improve in time it can take up to 7 to 10 days before you truly start to see a turnaround however things will get better if no improvement seen by Sunday may begin use of azithromycin which will be available at the pharmacy  In the meantime take prednisone every morning with food to help reduce sinus pressure  Strep test is negative    You can take Tylenol and/or Ibuprofen as needed for fever reduction and pain relief.   For cough: honey 1/2 to 1 teaspoon (you can dilute the honey in water or another fluid).  You can also use guaifenesin and dextromethorphan for cough. You can use a humidifier for chest congestion and cough.  If you don't have a humidifier, you can sit in the bathroom with the hot shower running.      For sore throat: try warm salt water gargles, cepacol lozenges, throat spray, warm tea or water with lemon/honey, popsicles or ice, or OTC cold relief medicine for throat discomfort.   For congestion: take a daily anti-histamine like Zyrtec, Claritin, and a oral decongestant, such as pseudoephedrine.  You can also use Flonase 1-2 sprays in each nostril daily.   It is important to stay hydrated: drink plenty of fluids (water, gatorade/powerade/pedialyte, juices, or teas) to keep your throat moisturized and help further relieve irritation/discomfort.

## 2024-02-18 ENCOUNTER — Encounter: Payer: Self-pay | Admitting: Family Medicine

## 2024-02-18 ENCOUNTER — Ambulatory Visit: Admitting: Family Medicine

## 2024-02-18 VITALS — BP 100/69 | HR 73 | Temp 97.9°F | Resp 17 | Ht 65.0 in | Wt 252.8 lb

## 2024-02-18 DIAGNOSIS — J301 Allergic rhinitis due to pollen: Secondary | ICD-10-CM | POA: Diagnosis not present

## 2024-02-18 DIAGNOSIS — H66003 Acute suppurative otitis media without spontaneous rupture of ear drum, bilateral: Secondary | ICD-10-CM

## 2024-02-18 MED ORDER — TRIAMCINOLONE ACETONIDE 40 MG/ML IJ SUSP
40.0000 mg | Freq: Once | INTRAMUSCULAR | Status: AC
Start: 2024-02-18 — End: 2024-02-18
  Administered 2024-02-18: 40 mg via INTRAMUSCULAR

## 2024-02-18 MED ORDER — AMOXICILLIN-POT CLAVULANATE 875-125 MG PO TABS: 1.0000 | ORAL_TABLET | Freq: Two times a day (BID) | ORAL | 0 refills | Status: DC

## 2024-02-18 NOTE — Assessment & Plan Note (Signed)
 Will treat with triamcinalone shot. Call with any concerns or if not getting better. Call with any concerns.

## 2024-02-18 NOTE — Progress Notes (Signed)
 BP 100/69 (BP Location: Right Arm, Patient Position: Sitting, Cuff Size: Large)   Pulse 73   Temp 97.9 F (36.6 C) (Oral)   Resp 17   Ht 5\' 5"  (1.651 m)   Wt 252 lb 12.8 oz (114.7 kg)   LMP 02/16/2024 (Exact Date)   SpO2 98%   BMI 42.07 kg/m    Subjective:    Patient ID: Alexa Diaz, female    DOB: [birth year], 33 y.o.   MRN: [id number]  HPI: Alexa Diaz is a 33 y.o. female  Chief Complaint  Patient presents with   Allergies    Worked outside all weekend doing yard work. Saturday evening with sore throat, feels it in her chest all clear draining and clear when she coughs up. Dry cough, non productive till today.    UPPER RESPIRATORY TRACT INFECTION Duration: 5 days Worst symptom: itchy eyes, cough, sneezing, sore throat Fever: no Cough: yes Shortness of breath: no Wheezing: yes Chest pain: no Chest tightness: yes Chest congestion: yes Nasal congestion: yes Runny nose: yes Post nasal drip: yes Sneezing: yes Sore throat: yes Swollen glands: no Sinus pressure: yes Headache: no Face pain: no Toothache: no Ear pain: no Ear pressure: no  Eyes red/itching:yes Eye drainage/crusting: no  Vomiting: no Rash: no Fatigue: yes Sick contacts: no Strep contacts: no  Context: better Recurrent sinusitis: no Relief with OTC cold/cough medications: no  Treatments attempted: allegra  and aklaseltzer plus   Relevant past medical, surgical, family and social history reviewed and updated as indicated. Interim medical history since our last visit reviewed. Allergies and medications reviewed and updated.  Review of Systems  Constitutional: Negative.   HENT:  Positive for congestion, sinus pressure, sneezing and sore throat. Negative for dental problem, drooling, ear discharge, ear pain, facial swelling, hearing loss, mouth sores, nosebleeds, postnasal drip, rhinorrhea, sinus pain, tinnitus, trouble swallowing and voice change.   Eyes:  Positive for redness and itching.  Negative for photophobia, pain, discharge and visual disturbance.  Respiratory:  Positive for cough, chest tightness and wheezing. Negative for apnea, choking, shortness of breath and stridor.   Cardiovascular: Negative.   Gastrointestinal: Negative.   Musculoskeletal: Negative.   Psychiatric/Behavioral: Negative.      Per HPI unless specifically indicated above     Objective:    BP 100/69 (BP Location: Right Arm, Patient Position: Sitting, Cuff Size: Large)   Pulse 73   Temp 97.9 F (36.6 C) (Oral)   Resp 17   Ht 5\' 5"  (1.651 m)   Wt 252 lb 12.8 oz (114.7 kg)   LMP 02/16/2024 (Exact Date)   SpO2 98%   BMI 42.07 kg/m   Wt Readings from Last 3 Encounters:  02/18/24 252 lb 12.8 oz (114.7 kg)  01/05/24 259 lb 3.2 oz (117.6 kg)  12/14/23 261 lb 9.6 oz (118.7 kg)    Physical Exam Vitals and nursing note reviewed.  Constitutional:      General: She is not in acute distress.    Appearance: Normal appearance. She is obese. She is not ill-appearing, toxic-appearing or diaphoretic.  HENT:     Head: Normocephalic and atraumatic.     Right Ear: Ear canal and external ear normal. Tympanic membrane is erythematous and bulging.     Left Ear: Ear canal and external ear normal. Tympanic membrane is erythematous and bulging.     Nose: Congestion and rhinorrhea present.     Mouth/Throat:     Mouth: Mucous membranes are moist.     Pharynx: Oropharynx  is clear. No oropharyngeal exudate or posterior oropharyngeal erythema.  Eyes:     General: No scleral icterus.       Right eye: No discharge.        Left eye: No discharge.     Extraocular Movements: Extraocular movements intact.     Conjunctiva/sclera: Conjunctivae normal.     Pupils: Pupils are equal, round, and reactive to light.  Cardiovascular:     Rate and Rhythm: Normal rate and regular rhythm.     Pulses: Normal pulses.     Heart sounds: Normal heart sounds. No murmur heard.    No friction rub. No gallop.  Pulmonary:      Effort: Pulmonary effort is normal. No respiratory distress.     Breath sounds: Normal breath sounds. No stridor. No wheezing, rhonchi or rales.  Chest:     Chest wall: No tenderness.  Musculoskeletal:        General: Normal range of motion.     Cervical back: Normal range of motion and neck supple.  Skin:    General: Skin is warm and dry.     Capillary Refill: Capillary refill takes less than 2 seconds.     Coloration: Skin is not jaundiced or pale.     Findings: No bruising, erythema, lesion or rash.  Neurological:     General: No focal deficit present.     Mental Status: She is alert and oriented to person, place, and time. Mental status is at baseline.  Psychiatric:        Mood and Affect: Mood normal.        Behavior: Behavior normal.        Thought Content: Thought content normal.        Judgment: Judgment normal.     Results for orders placed or performed during the hospital encounter of 01/18/24  POCT rapid strep A   Collection Time: 01/18/24 12:24 PM  Result Value Ref Range   Rapid Strep A Screen Negative       Assessment & Plan:   Problem List Items Addressed This Visit       Respiratory   Allergic rhinitis   Will treat with triamcinalone shot. Call with any concerns or if not getting better. Call with any concerns.       Relevant Medications   triamcinolone  acetonide (KENALOG -40) injection 40 mg (Start on 02/18/2024  3:15 PM)   Other Visit Diagnoses       Non-recurrent acute suppurative otitis media of both ears without spontaneous rupture of tympanic membranes    -  Primary   Will treat with augmentin . Call with any concerns or if not improving.   Relevant Medications   amoxicillin -clavulanate (AUGMENTIN ) 875-125 MG tablet        Follow up plan: Return for As scheduled.

## 2024-02-21 ENCOUNTER — Encounter: Payer: Self-pay | Admitting: Family Medicine

## 2024-02-22 ENCOUNTER — Other Ambulatory Visit: Payer: Self-pay | Admitting: Family Medicine

## 2024-02-22 MED ORDER — FLUCONAZOLE 150 MG PO TABS
150.0000 mg | ORAL_TABLET | Freq: Once | ORAL | 0 refills | Status: AC
Start: 2024-02-22 — End: 2024-02-22

## 2024-02-23 ENCOUNTER — Telehealth: Payer: Self-pay

## 2024-02-23 NOTE — Telephone Encounter (Signed)
 Copied from CRM [id number]. Topic: Clinical - Prescription Issue >> Feb 22, 2024 10:43 AM Phil Braun wrote: Reason for CRM: pt got an antibiotic last week and is now having yeast infection symptoms. Could she please get something called in.    Ref amoxicillin -clavulanate (AUGMENTIN ) 875-125 MG tablet

## 2024-02-23 NOTE — Telephone Encounter (Signed)
 Completed already through Mychart.

## 2024-03-01 ENCOUNTER — Other Ambulatory Visit: Payer: Self-pay | Admitting: Family Medicine

## 2024-03-03 NOTE — Telephone Encounter (Signed)
 Requested by interface surescripts. Medication discontinued 06/17/22.  Requested Prescriptions  Refused Prescriptions Disp Refills   citalopram  (CELEXA ) 40 MG tablet [Pharmacy Med Name: CITALOPRAM  HBR 40 MG TABLET] 30 tablet 0    Sig: Take 1 tablet (40 mg total) by mouth daily.     Psychiatry:  Antidepressants - SSRI Passed - 03/03/2024  1:36 PM      Passed - Valid encounter within last 6 months    Recent Outpatient Visits           2 weeks ago Non-recurrent acute suppurative otitis media of both ears without spontaneous rupture of tympanic membranes   [place] [facility] Coleta, Megan P, DO   1 month ago Chronic post-traumatic stress disorder (PTSD)   [place] [facility] Barnard, Megan P, DO   2 months ago Morbid obesity [facility])   [place] [facility] [place]ier, Luis M. Cintron, DO

## 2024-04-06 ENCOUNTER — Encounter: Payer: Self-pay | Admitting: Family Medicine

## 2024-04-06 ENCOUNTER — Ambulatory Visit: Admitting: Family Medicine

## 2024-04-06 DIAGNOSIS — R197 Diarrhea, unspecified: Secondary | ICD-10-CM | POA: Diagnosis not present

## 2024-04-06 MED ORDER — CLONAZEPAM 0.5 MG PO TABS: 0.5000 mg | ORAL_TABLET | Freq: Two times a day (BID) | ORAL | 1 refills | Status: AC | PRN

## 2024-04-06 MED ORDER — ESCITALOPRAM OXALATE 5 MG PO TABS: 5.0000 mg | ORAL_TABLET | Freq: Every day | ORAL | 0 refills | Status: DC

## 2024-04-06 MED ORDER — METFORMIN HCL ER 500 MG PO TB24: 1000.0000 mg | ORAL_TABLET | Freq: Two times a day (BID) | ORAL | 0 refills | Status: DC

## 2024-04-06 NOTE — Progress Notes (Signed)
 BP 108/75 (BP Location: Left Arm, Patient Position: Sitting, Cuff Size: Large)   Pulse 65   Ht 5' 5 (1.651 m)   Wt 243 lb 9.6 oz (110.5 kg)   SpO2 98%   BMI 40.54 kg/m    Subjective:    Patient ID: Alexa Diaz, female    DOB: 03/25/1991, 33 y.o.   MRN: [id number]  HPI: Alexa Diaz is a 33 y.o. female  Chief Complaint  Patient presents with   Obesity   OBESITY Duration: chronic Previous attempts at weight loss: yes Complications of obesity: yes Peak weight:  273lbs Weight loss goal: needs to be down 220-230 to have surgery Weight loss to date: 30 lbs Requesting obesity pharmacotherapy: yes Current weight loss supplements/medications: yes Previous weight loss supplements/meds: yes  Relevant past medical, surgical, family and social history reviewed and updated as indicated. Interim medical history since our last visit reviewed. Allergies and medications reviewed and updated.  Review of Systems  Constitutional: Negative.   Respiratory: Negative.    Cardiovascular: Negative.   Gastrointestinal:  Positive for diarrhea and vomiting (rarely). Negative for abdominal distention, abdominal pain, anal bleeding, blood in stool, constipation, nausea and rectal pain.  Musculoskeletal: Negative.   Neurological: Negative.   Psychiatric/Behavioral:  Negative for agitation, behavioral problems, confusion, decreased concentration, dysphoric mood, hallucinations, self-injury, sleep disturbance and suicidal ideas. The patient is nervous/anxious. The patient is not hyperactive.     Per HPI unless specifically indicated above     Objective:     BP 108/75 (BP Location: Left Arm, Patient Position: Sitting, Cuff Size: Large)   Pulse 65   Ht 5' 5 (1.651 m)   Wt 243 lb 9.6 oz (110.5 kg)   SpO2 98%   BMI 40.54 kg/m   Wt Readings from Last 3 Encounters:  04/06/24 243 lb 9.6 oz (110.5 kg)  02/18/24 252 lb 12.8 oz (114.7 kg)  01/05/24 259 lb 3.2 oz (117.6 kg)    Physical  Exam Vitals and nursing note reviewed.  Constitutional:      General: She is not in acute distress.    Appearance: Normal appearance. She is well-developed. She is obese.  HENT:     Head: Normocephalic and atraumatic.     Right Ear: Hearing and external ear normal.     Left Ear: Hearing and external ear normal.     Nose: Nose normal.     Mouth/Throat:     Mouth: Mucous membranes are moist.     Pharynx: Oropharynx is clear.  Eyes:     General: Lids are normal. No scleral icterus.       Right eye: No discharge.        Left eye: No discharge.     Conjunctiva/sclera: Conjunctivae normal.  Pulmonary:     Effort: Pulmonary effort is normal. No respiratory distress.  Musculoskeletal:        General: Normal range of motion.  Skin:    Coloration: Skin is not jaundiced or pale.     Findings: No bruising, erythema, lesion or rash.  Neurological:     Mental Status: She is alert. Mental status is at baseline. She is disoriented.  Psychiatric:        Mood and Affect: Mood normal.        Speech: Speech normal.        Behavior: Behavior normal.        Thought Content: Thought content normal.        Judgment: Judgment normal.  Results for orders placed or performed during the hospital encounter of 01/18/24  POCT rapid strep A   Collection Time: 01/18/24 12:24 PM  Result Value Ref Range   Rapid Strep A Screen Negative       Assessment & Plan:   Problem List Items Addressed This Visit       Other   Morbid obesity (HCC) - Primary   Congratulated patient on 30lb weight loss! Continue metformin . Continue to monitor. Call with any concerns. Recheck 4 months at physical.      Relevant Medications   metFORMIN  (GLUCOPHAGE -XR) 500 MG 24 hr tablet   Other Visit Diagnoses       Diarrhea, unspecified type       Likely due to metformin . Will start fiber and probiotic. Continue to monitor. Call with any concerns.        Follow up plan: Return in about 19 weeks (around 08/17/2024)  for physical.

## 2024-04-06 NOTE — Assessment & Plan Note (Addendum)
 Congratulated patient on 30lb weight loss! Continue metformin . Continue to monitor. Call with any concerns. Recheck 4 months at physical.

## 2024-04-08 ENCOUNTER — Ambulatory Visit: Admitting: Family Medicine

## 2024-07-21 ENCOUNTER — Encounter: Payer: Self-pay | Admitting: Family Medicine

## 2024-08-12 ENCOUNTER — Encounter: Payer: Self-pay | Admitting: Family Medicine

## 2024-08-18 ENCOUNTER — Encounter: Payer: Self-pay | Admitting: Family Medicine

## 2024-08-18 ENCOUNTER — Encounter: Admitting: Family Medicine

## 2024-08-19 ENCOUNTER — Ambulatory Visit: Admitting: Family Medicine

## 2024-08-19 ENCOUNTER — Telehealth: Payer: Self-pay | Admitting: Family Medicine

## 2024-08-19 ENCOUNTER — Ambulatory Visit: Payer: Self-pay

## 2024-08-19 ENCOUNTER — Encounter: Payer: Self-pay | Admitting: Family Medicine

## 2024-08-19 ENCOUNTER — Other Ambulatory Visit: Payer: Self-pay | Admitting: Family Medicine

## 2024-08-19 VITALS — BP 108/68 | HR 56 | Temp 98.2°F | Ht 65.0 in | Wt 237.0 lb

## 2024-08-19 DIAGNOSIS — K2900 Acute gastritis without bleeding: Secondary | ICD-10-CM | POA: Diagnosis not present

## 2024-08-19 DIAGNOSIS — K648 Other hemorrhoids: Secondary | ICD-10-CM | POA: Diagnosis not present

## 2024-08-19 DIAGNOSIS — K5909 Other constipation: Secondary | ICD-10-CM

## 2024-08-19 MED ORDER — CORTIFOAM 10 % EX FOAM: 1.0000 | Freq: Two times a day (BID) | CUTANEOUS | 0 refills | Status: DC

## 2024-08-19 MED ORDER — HYDROCORTISONE (PERIANAL) 2.5 % EX CREA: 1.0000 | TOPICAL_CREAM | Freq: Two times a day (BID) | CUTANEOUS | 0 refills | Status: DC

## 2024-08-19 MED ORDER — PANTOPRAZOLE SODIUM 40 MG PO TBEC: 40.0000 mg | DELAYED_RELEASE_TABLET | Freq: Every day | ORAL | 0 refills | Status: DC

## 2024-08-19 NOTE — Assessment & Plan Note (Signed)
 Bowel habits and gastrointestinal history - History of constipation, which has remained unchanged - Metformin  use for weight loss, effective but occasionally causes upset stomach depending on diet  Constipation Constipation contributing to hemorrhoid development. - Recommended Metamucil once daily to regulate bowel movements. - Advised starting with a small amount of Metamucil to avoid gastrointestinal upset. - If no improvement with Metamucil alone, add Miralax . - Educated on the importance of hydration for bowel regularity.

## 2024-08-19 NOTE — Telephone Encounter (Signed)
 Copied from CRM [id number]. Topic: Clinical - Prescription Issue >> Aug 19, 2024  2:30 PM Delon DASEN wrote: Reason for CRM: Hydrocortisone Acetate (CORTIFOAM) 10 % FOAM- pharmacy unable to get his item anymore, need to replace with hydrocortisone 2.5% cream- Leita with [facility] [phone]

## 2024-08-19 NOTE — Patient Instructions (Signed)
 Patient Instructions  For hemorrhoids: - Use the prescribed steroid hemorrhoid foam twice daily for 5-7 days, and up to 2 weeks if needed. Stop when symptoms resolve. - Apply the foam after showering or cleaning the area. Use gloves if preferred. Make sure the foam reaches the internal hemorrhoid. - Take Sitz baths as needed for comfort.  For constipation: - Start Metamucil once daily to help regulate bowel movements. Begin with a small amount to avoid stomach upset. - If constipation does not improve with Metamucil alone, add Miralax  as directed. - Drink plenty of water every day.  For gastritis: - Take pantoprazole once daily on an empty stomach for one week, then as needed until symptoms are gone. - Avoid spicy and greasy foods. Eat lighter meals such as soups and broths. Follow a bland diet.  Monitor for the following red flags: - Large amounts of rectal bleeding, especially if you feel lightheaded or weak. - Bleeding that gets worse or does not stop. - Black or tarry stools. - Severe abdominal pain or fever. If any of these occur, seek urgent medical attention.  If you have questions or your symptoms do not improve, schedule a follow-up appointment with your PCP.

## 2024-08-19 NOTE — Telephone Encounter (Signed)
 Scheduled an acute visit in [place]

## 2024-08-19 NOTE — Progress Notes (Signed)
 Primary Care / Sports Medicine Office Visit  Patient Information:  Patient ID: Alexa Diaz, female DOB: Apr 15, 1991 Age: 33 y.o. MRN: [id number]   Alexa Diaz is a pleasant 33 y.o. female presenting with the following:  Chief Complaint  Patient presents with   Constipation    Patient bleeding after BM. Started 08/17/24 blood is bright red. Blood clots with BM.     Vitals:   08/19/24 1103  BP: 108/68  Pulse: (!) 56  Temp: 98.2 F (36.8 C)  SpO2: 99%   Vitals:   08/19/24 1103  Weight: 237 lb (107.5 kg)  Height: 5' 5 (1.651 m)   Body mass index is 39.44 kg/m.  No results found.   Discussed the use of AI scribe software for clinical note transcription with the patient, who gave verbal consent to proceed.   Independent interpretation of notes and tests performed by another provider:   None  Procedures performed:   None  Pertinent History, Exam, Impression, and Recommendations:   Problem List Items Addressed This Visit     Acute gastritis   Abdominal and pelvic cramping - Cramping described as similar to both stomach cramps and menstrual cramps - Pain located low near the pelvis and sometimes in the stomach area - Cramping present without associated fever  Gastritis Suspected gastritis due to dietary factors. - Prescribed pantoprazole once daily on an empty stomach for one week, then daily PRN until asymptomatic. - Advised dietary modifications: avoid spicy and greasy foods, consume lighter meals such as soups and broths. Target bland diet.      Relevant Medications   pantoprazole (PROTONIX) 40 MG tablet   Chronic constipation   Bowel habits and gastrointestinal history - History of constipation, which has remained unchanged - Metformin  use for weight loss, effective but occasionally causes upset stomach depending on diet  Constipation Constipation contributing to hemorrhoid development. - Recommended Metamucil once daily to regulate bowel  movements. - Advised starting with a small amount of Metamucil to avoid gastrointestinal upset. - If no improvement with Metamucil alone, add Miralax . - Educated on the importance of hydration for bowel regularity.      Hemorrhoids, internal - Primary   History of Present Illness Alexa Diaz is a 33 year old female who presents with rectal bleeding and abdominal cramping.  Rectal bleeding - Onset Wednesday morning following an episode of upset stomach after consuming greasy food on Tuesday - Bright red blood and a large blood clot observed during bowel movement at work, filling the toilet bowl and present on toilet paper - Bleeding persisted with subsequent bowel movements and occurred even when passing gas without a bowel movement - Bleeding continued into Thursday, including episodes after lunch - Blood observed even when only urinating, not always associated with bowel movements - Bleeding described as painless, with some rawness from frequent wiping - Bleeding more significant during bowel movements, less so when urinating - No black, tarry stools or coffee ground-like material in stool - Using panty liners to manage bleeding  Constitutional symptoms - Mild weakness by the end of Thursday night - No fever  Menstrual history - Menstrual period ended on Wednesday, the same day rectal bleeding began  Physical Exam ABDOMEN: Abdomen soft, diffusely tender, without rebound. Bowel sounds normoactive to hypoactive.No high pitched bowel sounds. No hepatosplenomegaly. RECTAL: Internal hemorrhoid, roughly 1-2 mm ovoid, palpable at 7 o'clock position. No external hemorrhoids noted, no fissures.  HOCT +   Assessment and Plan Hemorrhoids Internal hemorrhoids with painless bright  red bleeding, likely exacerbated by constipation. - Prescribed steroid hemorrhoid foam, apply twice daily for 5-7 days and up to 2 weeks, stop when symptoms resolve. - Advised on proper application technique:  can use gloves, apply after showering / clean area, ensure foam reaches internal hemorrhoid. - Can utilize Sitz baths - Advised monitoring for increased bleeding or lightheadedness, seek urgent care if symptoms worsen.      Relevant Medications   Hydrocortisone Acetate (CORTIFOAM) 10 % FOAM     Orders & Medications Medications:  Meds ordered this encounter  Medications   Hydrocortisone Acetate (CORTIFOAM) 10 % FOAM    Sig: Place 1 Application rectally 2 (two) times daily. X 5-7 days and up to 2 weeks. Stop when symptoms resolve    Dispense:  15 g    Refill:  0   pantoprazole (PROTONIX) 40 MG tablet    Sig: Take 1 tablet (40 mg total) by mouth daily. X 1 week then daily PRN    Dispense:  30 tablet    Refill:  0   No orders of the defined types were placed in this encounter.    No follow-ups on file.     Selinda JINNY Ku, MD, [facility]   Primary Care Sports Medicine Primary Care and Sports Medicine at [facility] [place]

## 2024-08-19 NOTE — Telephone Encounter (Signed)
 FYI Only or Action Required?: FYI only for provider.  Patient was last seen in primary care on 04/06/2024 by Vicci Duwaine SQUIBB, DO.  Called Nurse Triage reporting Rectal Bleeding.  Symptoms began several days ago.  Interventions attempted: Nothing.  Symptoms are: unchanged.  Triage Disposition: Go to ED Now (Notify PCP)  Patient/caregiver understands and will follow disposition?: Yes           Copied from CRM [id number]. Topic: Clinical - Red Word Triage >> Aug 19, 2024  8:04 AM Edsel HERO wrote: Rectal bleeding Reason for Disposition  SEVERE rectal bleeding (e.g., large blood clots; constant or on and off bleeding)    Pt reported significant bleeding/clots similar to when she had a miscarriage a few years back.  Answer Assessment - Initial Assessment Questions 1. APPEARANCE of BLOOD: What color is it? Is it passed separately, on the surface of the stool, or mixed in with the stool?      Blood clots noted. Bright red.  2. AMOUNT: How much blood was passed?      Toilet bowl water red 3. FREQUENCY: How many times has blood been passed with the stools?      Quite a bit 4. ONSET: When was the blood first seen in the stools? (Days or weeks)      X3 days 5. DIARRHEA: Is there also some diarrhea? If Yes, ask: How many diarrhea stools in the past 24 hours?      Yes, endorses loose BM 6. CONSTIPATION: Do you have constipation? If Yes, ask: How bad is it?     denies 7. RECURRENT SYMPTOMS: Have you had blood in your stools before? If Yes, ask: When was the last time? and What happened that time?      denies 8. BLOOD THINNERS: Do you take any blood thinners? (e.g., aspirin, clopidogrel / Plavix, coumadin, heparin). Notes: Other strong blood thinners include: Arixtra (fondaparinux), Eliquis (apixaban), Pradaxa (dabigatran), and Xarelto (rivaroxaban).     Denies. Endorses only taking metformin  and lexapro  9. OTHER SYMPTOMS: Do you have any other symptoms?   (e.g., abdomen pain, vomiting, dizziness, fever)     Abdominal pain, nausea, intermittent fatigue 10. PREGNANCY: Is there any chance you are pregnant? When was your last menstrual period?       *No Answer*  Protocols used: Rectal Bleeding-A-AH

## 2024-08-19 NOTE — Assessment & Plan Note (Addendum)
 History of Present Illness Alexa Diaz is a 33 year old female who presents with rectal bleeding and abdominal cramping.  Rectal bleeding - Onset Wednesday morning following an episode of upset stomach after consuming greasy food on Tuesday - Bright red blood and a large blood clot observed during bowel movement at work, filling the toilet bowl and present on toilet paper - Bleeding persisted with subsequent bowel movements and occurred even when passing gas without a bowel movement - Bleeding continued into Thursday, including episodes after lunch - Blood observed even when only urinating, not always associated with bowel movements - Bleeding described as painless, with some rawness from frequent wiping - Bleeding more significant during bowel movements, less so when urinating - No black, tarry stools or coffee ground-like material in stool - Using panty liners to manage bleeding  Constitutional symptoms - Mild weakness by the end of Thursday night - No fever  Menstrual history - Menstrual period ended on Wednesday, the same day rectal bleeding began  Physical Exam ABDOMEN: Abdomen soft, diffusely tender, without rebound. Bowel sounds normoactive to hypoactive.No high pitched bowel sounds. No hepatosplenomegaly. RECTAL: Internal hemorrhoid, roughly 1-2 mm ovoid, palpable at 7 o'clock position. No external hemorrhoids noted, no fissures.  HOCT +   Assessment and Plan Hemorrhoids Internal hemorrhoids with painless bright red bleeding, likely exacerbated by constipation. - Prescribed steroid hemorrhoid foam, apply twice daily for 5-7 days and up to 2 weeks, stop when symptoms resolve. - Advised on proper application technique: can use gloves, apply after showering / clean area, ensure foam reaches internal hemorrhoid. - Can utilize Sitz baths - Advised monitoring for increased bleeding or lightheadedness, seek urgent care if symptoms worsen.

## 2024-08-19 NOTE — Assessment & Plan Note (Signed)
 Abdominal and pelvic cramping - Cramping described as similar to both stomach cramps and menstrual cramps - Pain located low near the pelvis and sometimes in the stomach area - Cramping present without associated fever  Gastritis Suspected gastritis due to dietary factors. - Prescribed pantoprazole once daily on an empty stomach for one week, then daily PRN until asymptomatic. - Advised dietary modifications: avoid spicy and greasy foods, consume lighter meals such as soups and broths. [employer] bland diet.

## 2024-08-22 ENCOUNTER — Encounter: Admitting: Family Medicine

## 2024-09-27 ENCOUNTER — Encounter: Payer: Self-pay | Admitting: Family Medicine

## 2024-09-27 ENCOUNTER — Ambulatory Visit: Admitting: Family Medicine

## 2024-09-27 VITALS — BP 116/83 | HR 79 | Temp 98.0°F | Ht 65.0 in | Wt 238.0 lb

## 2024-09-27 DIAGNOSIS — N920 Excessive and frequent menstruation with regular cycle: Secondary | ICD-10-CM

## 2024-09-27 DIAGNOSIS — F4312 Post-traumatic stress disorder, chronic: Secondary | ICD-10-CM

## 2024-09-27 DIAGNOSIS — Z0000 Encounter for general adult medical examination without abnormal findings: Secondary | ICD-10-CM

## 2024-09-27 DIAGNOSIS — K2900 Acute gastritis without bleeding: Secondary | ICD-10-CM

## 2024-09-27 MED ORDER — PANTOPRAZOLE SODIUM 40 MG PO TBEC: 40.0000 mg | DELAYED_RELEASE_TABLET | Freq: Every day | ORAL | 0 refills | Status: AC

## 2024-09-27 MED ORDER — METFORMIN HCL ER 500 MG PO TB24: 1000.0000 mg | ORAL_TABLET | Freq: Two times a day (BID) | ORAL | 0 refills | Status: DC

## 2024-09-27 MED ORDER — ESCITALOPRAM OXALATE 10 MG PO TABS: 10.0000 mg | ORAL_TABLET | Freq: Every day | ORAL | 0 refills | Status: DC

## 2024-09-27 NOTE — Assessment & Plan Note (Signed)
 Has done great with losing 35lbs! Needs to lose at least 8 more pounds in next 6 weeks for surgery. Will continue diet and exercise and increase her metformin  to 1000mg  BID. Recheck 1 month. Call with any concerns.

## 2024-09-27 NOTE — Assessment & Plan Note (Signed)
 Mood acting up. Will increase her lexapro  to 10mg  and recheck in about 4 weeks. Call with any concerns.

## 2024-09-27 NOTE — Progress Notes (Unsigned)
 BP 116/83   Pulse 79   Temp 98 F (36.7 C) (Oral)   Ht 5' 5 (1.651 m)   Wt 238 lb (108 kg)   SpO2 98%   BMI 39.61 kg/m    Subjective:    Patient ID: Alexa Diaz, female    DOB: 08-Oct-1991, 33 y.o.   MRN: [id number]  HPI: Alexa Diaz is a 33 y.o. female presenting on 09/27/2024 for comprehensive medical examination. Current medical complaints include:  OBESITY Duration: chronic Previous attempts at weight loss: yes Complications of obesity: yes Peak weight:  273lbs Weight loss goal: needs to be down 220-230 to have surgery Weight loss to date: 35 lbs Requesting obesity pharmacotherapy: yes Current weight loss supplements/medications: yes Previous weight loss supplements/meds: yes   ANXIETY/STRESS Duration: chronic Status:exacerbated Anxious mood: yes  Excessive worrying: yes Irritability: no  Sweating: yes Nausea: no Palpitations:no Hyperventilation: no Panic attacks: no Agoraphobia: no  Obscessions/compulsions: no Depressed mood: yes    09/27/2024    4:54 PM 08/19/2024   11:08 AM 04/06/2024   10:42 AM 02/18/2024    2:45 PM 12/14/2023    4:27 PM  Depression screen PHQ 2/9  Decreased Interest 1 0 1 0 0  Down, Depressed, Hopeless 0 0 0 0 0  PHQ - 2 Score 1 0 1 0 0  Altered sleeping 1  0 0 1  Tired, decreased energy 1  1 1 1   Change in appetite 0  1 0 0  Feeling bad or failure about yourself  0  0 0 0  Trouble concentrating 0  0 0 0  Moving slowly or fidgety/restless 1  0 0 0  Suicidal thoughts 0  0 0 0  PHQ-9 Score 4  3  1  2    Difficult doing work/chores Somewhat difficult  Not difficult at all Not difficult at all Not difficult at all     Data saved with a previous flowsheet row definition   Anhedonia: no Weight changes: no Insomnia: no   Hypersomnia: no Fatigue/loss of energy: yes Feelings of worthlessness: no Feelings of guilt: no Impaired concentration/indecisiveness: no Suicidal ideations: no  Crying spells: no Recent Stressors/Life  Changes: no   Relationship problems: no   Family stress: no     Financial stress: no    Job stress: no    Recent death/loss: no  Since her miscarriage and D&C she has been having significantly heavier bleeding and cramping with her periods. She's concerned about this.    She currently lives with: husband and child Menopausal Symptoms: no  Depression Screen done today and results listed below:     09/27/2024    4:54 PM 08/19/2024   11:08 AM 04/06/2024   10:42 AM 02/18/2024    2:45 PM 12/14/2023    4:27 PM  Depression screen PHQ 2/9  Decreased Interest 1 0 1 0 0  Down, Depressed, Hopeless 0 0 0 0 0  PHQ - 2 Score 1 0 1 0 0  Altered sleeping 1  0 0 1  Tired, decreased energy 1  1 1 1   Change in appetite 0  1 0 0  Feeling bad or failure about yourself  0  0 0 0  Trouble concentrating 0  0 0 0  Moving slowly or fidgety/restless 1  0 0 0  Suicidal thoughts 0  0 0 0  PHQ-9 Score 4  3  1  2    Difficult doing work/chores Somewhat difficult  Not difficult at all Not difficult  at all Not difficult at all     Data saved with a previous flowsheet row definition     Past Medical History:  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    Surgical History:  Past Surgical History:  Procedure Laterality Date   DILATION AND EVACUATION N/A 09/17/2022   Procedure: SUCTION DILATATION AND CURETTAGE;  Surgeon: Lovetta Debby PARAS, MD;  Location: [facility] OR[facility];  Service: Gynecology;  Laterality: N/A;   WISDOM TOOTH EXTRACTION      Medications:  Current Outpatient Medications on File Prior to Visit  Medication Sig    clonazePAM  (KLONOPIN ) 0.5 MG tablet Take 1 tablet (0.5 mg total) by mouth 2 (two) times daily as needed for anxiety.   No current facility-administered medications on file prior to visit.    Allergies:  Allergies  Allergen Reactions   Sertraline Hcl Other (See Comments)    Not effective   Wellbutrin  [Bupropion ] Rash    Social History:  Social History   Socioeconomic History   Marital status: Married    Spouse name: Not on file   Number of children: Not on file   Years of education: Not on file   Highest education level: Not on file  Occupational History   Not on file  Tobacco Use   Smoking status: Former    Current packs/day: 0.00    Types: Cigarettes    Quit date: 04/15/2016    Years since quitting: 8.4   Smokeless tobacco: Never  Vaping Use   Vaping status: Every Day  Substance and Sexual Activity   Alcohol use: Yes    Comment: rare   Drug use: Not Currently   Sexual activity: Yes    Birth control/protection: None  Other Topics Concern   Not on file  Social History Narrative   Not on file   Social Drivers of Health   Financial Resource Strain: Low Risk  (09/27/2024)   Overall Financial Resource Strain (CARDIA)    Difficulty of Paying Living Expenses: Not hard at all  Food Insecurity: No Food Insecurity (09/27/2024)   Hunger Vital Sign    Worried About Running Out of Food in the Last Year: Never true    Ran Out of Food in the Last Year: Never true  Transportation Needs: No Transportation Needs (09/27/2024)   PRAPARE - Administrator, Civil Service (Medical): No    Lack of Transportation (Non-Medical): No  Physical Activity: Inactive (09/27/2024)   Exercise Vital Sign    Days of Exercise per Week: 0 days    Minutes of Exercise per Session: 0 min  Stress: No Stress Concern Present (09/27/2024)   [employer] of Occupational Health - Occupational Stress Questionnaire    Feeling of Stress: Not at all  Social Connections: Moderately Integrated  (09/27/2024)   Social Connection and Isolation Panel    Frequency of Communication with Friends and Family: More than three times a week    Frequency of Social Gatherings with Friends and Family: Once a week  Attends Religious Services: More than 4 times per year    Active Member of Clubs or Organizations: No    Attends Banker Meetings: Never    Marital Status: Married  Catering Manager Violence: Not At Risk (09/27/2024)   Humiliation, Afraid, Rape, and Kick questionnaire    Fear of Current or Ex-Partner: No    Emotionally Abused: No    Physically Abused: No    Sexually Abused: No   Social History   Tobacco Use  Smoking Status Former   Current packs/day: 0.00   Types: Cigarettes   Quit date: 04/15/2016   Years since quitting: 8.4  Smokeless Tobacco Never   Social History   Substance and Sexual Activity  Alcohol Use Yes   Comment: rare    Family History:  Family History  Problem Relation Age of Onset   Prostate cancer Father        dx late 9s   Alcohol abuse Father    Arthritis Father    Hyperlipidemia Father    Hypertension Father    Thyroid disease Brother    Ovarian cancer Maternal Grandmother        late 59s   Kidney cancer Maternal Grandmother    Lung cancer Maternal Grandmother    Breast cancer Other        unknown age   Ovarian cancer Other        unknown age   Melanoma Other     Past medical history, surgical history, medications, allergies, family history and social history reviewed with patient today and changes made to appropriate areas of the chart.   Review of Systems  Constitutional: Negative.   HENT: Negative.    Eyes: Negative.   Respiratory: Negative.    Cardiovascular: Negative.   Gastrointestinal:  Positive for diarrhea (just today). Negative for abdominal pain, blood in stool, constipation, heartburn, melena, nausea and vomiting.  Genitourinary: Negative.        Really bad cramps the week of ovulation and the week of her  period  Musculoskeletal:  Positive for back pain. Negative for falls, joint pain, myalgias and neck pain.  Skin: Negative.   Neurological:  Positive for tingling. Negative for dizziness, tremors, sensory change, speech change, focal weakness, seizures, loss of consciousness, weakness and headaches.  Endo/Heme/Allergies:  Negative for environmental allergies and polydipsia. Bruises/bleeds easily.  Psychiatric/Behavioral:  Negative for depression, hallucinations, memory loss, substance abuse and suicidal ideas. The patient is nervous/anxious. The patient does not have insomnia.    All other ROS negative except what is listed above and in the HPI.      Objective:    BP 116/83   Pulse 79   Temp 98 F (36.7 C) (Oral)   Ht 5' 5 (1.651 m)   Wt 238 lb (108 kg)   SpO2 98%   BMI 39.61 kg/m   Wt Readings from Last 3 Encounters:  09/27/24 238 lb (108 kg)  08/19/24 237 lb (107.5 kg)  04/06/24 243 lb 9.6 oz (110.5 kg)    Physical Exam Vitals and nursing note reviewed.  Constitutional:      General: She is not in acute distress.    Appearance: Normal appearance. She is obese. She is not ill-appearing, toxic-appearing or diaphoretic.  HENT:     Head: Normocephalic and atraumatic.     Right Ear: Tympanic membrane, ear canal and external ear normal. There is no impacted cerumen.     Left Ear: Tympanic membrane, ear canal and external ear normal.  There is no impacted cerumen.     Nose: Nose normal. No congestion or rhinorrhea.     Mouth/Throat:     Mouth: Mucous membranes are moist.     Pharynx: Oropharynx is clear. No oropharyngeal exudate or posterior oropharyngeal erythema.  Eyes:     General: No scleral icterus.       Right eye: No discharge.        Left eye: No discharge.     Extraocular Movements: Extraocular movements intact.     Conjunctiva/sclera: Conjunctivae normal.     Pupils: Pupils are equal, round, and reactive to light.  Neck:     Vascular: No carotid bruit.   Cardiovascular:     Rate and Rhythm: Normal rate and regular rhythm.     Pulses: Normal pulses.     Heart sounds: No murmur heard.    No friction rub. No gallop.  Pulmonary:     Effort: Pulmonary effort is normal. No respiratory distress.     Breath sounds: Normal breath sounds. No stridor. No wheezing, rhonchi or rales.  Chest:     Chest wall: No tenderness.  Abdominal:     General: Abdomen is flat. Bowel sounds are normal. There is no distension.     Palpations: Abdomen is soft. There is no mass.     Tenderness: There is no abdominal tenderness. There is no right CVA tenderness, left CVA tenderness, guarding or rebound.     Hernia: No hernia is present.  Genitourinary:    Comments: Breast and pelvic exams deferred with shared decision making Musculoskeletal:        General: No swelling, tenderness, deformity or signs of injury.     Cervical back: Normal range of motion and neck supple. No rigidity. No muscular tenderness.     Right lower leg: No edema.     Left lower leg: No edema.  Lymphadenopathy:     Cervical: No cervical adenopathy.  Skin:    General: Skin is warm and dry.     Capillary Refill: Capillary refill takes less than 2 seconds.     Coloration: Skin is not jaundiced or pale.     Findings: No bruising, erythema, lesion or rash.  Neurological:     General: No focal deficit present.     Mental Status: She is alert and oriented to person, place, and time. Mental status is at baseline.     Cranial Nerves: No cranial nerve deficit.     Sensory: No sensory deficit.     Motor: No weakness.     Coordination: Coordination normal.     Gait: Gait normal.     Deep Tendon Reflexes: Reflexes normal.  Psychiatric:        Mood and Affect: Mood normal.        Behavior: Behavior normal.        Thought Content: Thought content normal.        Judgment: Judgment normal.     Results for orders placed or performed during the hospital encounter of 01/18/24  POCT rapid strep A    Collection Time: 01/18/24 12:24 PM  Result Value Ref Range   Rapid Strep A Screen Negative       Assessment & Plan:   Problem List Items Addressed This Visit       Digestive   Acute gastritis   Relevant Medications   pantoprazole  (PROTONIX ) 40 MG tablet     Other   Chronic post-traumatic stress disorder (PTSD)   Mood  acting up. Will increase her lexapro  to 10mg  and recheck in about 4 weeks. Call with any concerns.       Relevant Medications   escitalopram  (LEXAPRO ) 10 MG tablet   Morbid obesity (HCC)   Has done great with losing 35lbs! Needs to lose at least 8 more pounds in next 6 weeks for surgery. Will continue diet and exercise and increase her metformin  to 1000mg  BID. Recheck 1 month. Call with any concerns.       Relevant Medications   metFORMIN  (GLUCOPHAGE -XR) 500 MG 24 hr tablet   Other Visit Diagnoses       Routine general medical examination at a health care facility    -  Primary   Vaccines up to date. Screening labs checked today. Pap up to date. Continue diet and exercise. Call with any concerns.   Relevant Orders   CBC with Differential/Platelet   Comprehensive metabolic panel with GFR   Lipid Panel w/o Chol/HDL Ratio   TSH     Menorrhagia with regular cycle       Will get US . Await results. Treat as needed.   Relevant Orders   US  Pelvic Complete With Transvaginal        Follow up plan: Return in about 4 weeks (around 10/25/2024).   LABORATORY TESTING:  - Pap smear: up to date  IMMUNIZATIONS:   - Tdap: Tetanus vaccination status reviewed: Td vaccination indicated and given today. - Influenza: Refused - Pneumovax: Not applicable - Prevnar: Not applicable - COVID: Refused - HPV: Refused  PATIENT COUNSELING:   Advised to take 1 mg of folate supplement per day if capable of pregnancy.   Sexuality: Discussed sexually transmitted diseases, partner selection, use of condoms, avoidance of unintended pregnancy  and contraceptive alternatives.    Advised to avoid cigarette smoking.  I discussed with the patient that most people either abstain from alcohol or drink within safe limits (<=14/week and <=4 drinks/occasion for males, <=7/weeks and <= 3 drinks/occasion for females) and that the risk for alcohol disorders and other health effects rises proportionally with the number of drinks per week and how often a drinker exceeds daily limits.  Discussed cessation/primary prevention of drug use and availability of treatment for abuse.   Diet: Encouraged to adjust caloric intake to maintain  or achieve ideal body weight, to reduce intake of dietary saturated fat and total fat, to limit sodium intake by avoiding high sodium foods and not adding table salt, and to maintain adequate dietary potassium and calcium preferably from fresh fruits, vegetables, and low-fat dairy products.    stressed the importance of regular exercise  Injury prevention: Discussed safety belts, safety helmets, smoke detector, smoking near bedding or upholstery.   Dental health: Discussed importance of regular tooth brushing, flossing, and dental visits.    NEXT PREVENTATIVE PHYSICAL DUE IN 1 YEAR. Return in about 4 weeks (around 10/25/2024).

## 2024-09-28 ENCOUNTER — Ambulatory Visit: Payer: Self-pay | Admitting: Family Medicine

## 2024-09-28 LAB — COMPREHENSIVE METABOLIC PANEL WITH GFR
ALT: 9 IU/L (ref 0–32)
AST: 12 IU/L (ref 0–40)
Albumin: 4.4 g/dL (ref 3.9–4.9)
Alkaline Phosphatase: 66 IU/L (ref 41–116)
BUN/Creatinine Ratio: 11 (ref 9–23)
BUN: 9 mg/dL (ref 6–20)
Bilirubin Total: 0.3 mg/dL (ref 0.0–1.2)
CO2: 20 mmol/L (ref 20–29)
Calcium: 9.5 mg/dL (ref 8.7–10.2)
Chloride: 104 mmol/L (ref 96–106)
Creatinine, Ser: 0.79 mg/dL (ref 0.57–1.00)
Globulin, Total: 2.4 g/dL (ref 1.5–4.5)
Glucose: 91 mg/dL (ref 70–99)
Potassium: 4.4 mmol/L (ref 3.5–5.2)
Sodium: 141 mmol/L (ref 134–144)
Total Protein: 6.8 g/dL (ref 6.0–8.5)
eGFR: 101 mL/min/1.73 (ref >59)

## 2024-09-28 LAB — LIPID PANEL W/O CHOL/HDL RATIO
Cholesterol, Total: 229 mg/dL — ABNORMAL HIGH (ref 100–199)
HDL: 50 mg/dL (ref >39)
LDL Chol Calc (NIH): 158 mg/dL — ABNORMAL HIGH (ref 0–99)
Triglycerides: 116 mg/dL (ref 0–149)
VLDL Cholesterol Cal: 21 mg/dL (ref 5–40)

## 2024-09-28 LAB — CBC WITH DIFFERENTIAL/PLATELET
Basophils Absolute: 0 x10E3/uL (ref 0.0–0.2)
Basos: 0 %
EOS (ABSOLUTE): 0.1 x10E3/uL (ref 0.0–0.4)
Eos: 1 %
Hematocrit: 39.3 % (ref 34.0–46.6)
Hemoglobin: 12.7 g/dL (ref 11.1–15.9)
Immature Grans (Abs): 0 x10E3/uL (ref 0.0–0.1)
Immature Granulocytes: 0 %
Lymphocytes Absolute: 2.1 x10E3/uL (ref 0.7–3.1)
Lymphs: 32 %
MCH: 27.1 pg (ref 26.6–33.0)
MCHC: 32.3 g/dL (ref 31.5–35.7)
MCV: 84 fL (ref 79–97)
Monocytes Absolute: 0.5 x10E3/uL (ref 0.1–0.9)
Monocytes: 7 %
Neutrophils Absolute: 4 x10E3/uL (ref 1.4–7.0)
Neutrophils: 60 %
Platelets: 272 x10E3/uL (ref 150–450)
RBC: 4.68 x10E6/uL (ref 3.77–5.28)
RDW: 13.7 % (ref 11.7–15.4)
WBC: 6.6 x10E3/uL (ref 3.4–10.8)

## 2024-09-28 LAB — TSH: TSH: 1.04 u[IU]/mL (ref 0.450–4.500)

## 2024-10-28 ENCOUNTER — Encounter: Payer: Self-pay | Admitting: Family Medicine

## 2024-10-28 ENCOUNTER — Ambulatory Visit: Admitting: Family Medicine

## 2024-10-28 VITALS — BP 134/79 | HR 64 | Temp 97.9°F | Ht 65.0 in | Wt 244.2 lb

## 2024-10-28 DIAGNOSIS — F4312 Post-traumatic stress disorder, chronic: Secondary | ICD-10-CM | POA: Diagnosis not present

## 2024-10-28 MED ORDER — METFORMIN HCL ER 500 MG PO TB24: 1000.0000 mg | ORAL_TABLET | Freq: Two times a day (BID) | ORAL | 1 refills | Status: AC

## 2024-10-28 MED ORDER — PHENTERMINE HCL 15 MG PO CAPS: 15.0000 mg | ORAL_CAPSULE | ORAL | 0 refills | Status: DC

## 2024-10-28 MED ORDER — ESCITALOPRAM OXALATE 10 MG PO TABS: 10.0000 mg | ORAL_TABLET | Freq: Every day | ORAL | 1 refills | Status: AC

## 2024-10-28 NOTE — Progress Notes (Signed)
 "  BP 134/79   Pulse 64   Temp 97.9 F (36.6 C) (Oral)   Ht 5' 5 (1.651 m)   Wt 244 lb 3.2 oz (110.8 kg)   SpO2 97%   BMI 40.64 kg/m    Subjective:    Patient ID: Alexa Diaz, female    DOB: 01-11-1991, 34 y.o.   MRN: [id number]  HPI: Alexa Diaz is a 34 y.o. female  Chief Complaint  Patient presents with   Post-Traumatic Stress Disorder   Obesity   ANXIETY/DEPRESSION Duration: chronic Status:better Anxious mood: no  Excessive worrying: no Irritability: no  Sweating: no Nausea: no Palpitations:no Hyperventilation: no Panic attacks: no Agoraphobia: no  Obscessions/compulsions: no Depressed mood: no    09/27/2024    4:54 PM 08/19/2024   11:08 AM 04/06/2024   10:42 AM 02/18/2024    2:45 PM 12/14/2023    4:27 PM  Depression screen PHQ 2/9  Decreased Interest 1 0 1 0 0  Down, Depressed, Hopeless 0 0 0 0 0  PHQ - 2 Score 1 0 1 0 0  Altered sleeping 1  0 0 1  Tired, decreased energy 1  1 1 1   Change in appetite 0  1 0 0  Feeling bad or failure about yourself  0  0 0 0  Trouble concentrating 0  0 0 0  Moving slowly or fidgety/restless 1  0 0 0  Suicidal thoughts 0  0 0 0  PHQ-9 Score 4  3  1  2    Difficult doing work/chores Somewhat difficult  Not difficult at all Not difficult at all Not difficult at all     Data saved with a previous flowsheet row definition   Anhedonia: no Weight changes: no Insomnia: no   Hypersomnia: no Fatigue/loss of energy: no Feelings of worthlessness: no Feelings of guilt: no Impaired concentration/indecisiveness: no Suicidal ideations: no  Crying spells: no Recent Stressors/Life Changes: no   Relationship problems: no   Family stress: no     Financial stress: no    Job stress: no    Recent death/loss: no  OBESITY Duration: chronic Previous attempts at weight loss: yes Complications of obesity: yes Peak weight:  273lbs Weight loss goal: needs to be down 220-230 to have surgery Weight loss to date: 29  lbs Requesting obesity pharmacotherapy: yes Current weight loss supplements/medications: yes Previous weight loss supplements/meds: yes  Relevant past medical, surgical, family and social history reviewed and updated as indicated. Interim medical history since our last visit reviewed. Allergies and medications reviewed and updated.  Review of Systems  Constitutional: Negative.   Respiratory: Negative.    Cardiovascular: Negative.   Musculoskeletal: Negative.   Neurological: Negative.   Psychiatric/Behavioral: Negative.      Per HPI unless specifically indicated above     Objective:    BP 134/79   Pulse 64   Temp 97.9 F (36.6 C) (Oral)   Ht 5' 5 (1.651 m)   Wt 244 lb 3.2 oz (110.8 kg)   SpO2 97%   BMI 40.64 kg/m   Wt Readings from Last 3 Encounters:  10/28/24 244 lb 3.2 oz (110.8 kg)  09/27/24 238 lb (108 kg)  08/19/24 237 lb (107.5 kg)    Physical Exam Vitals and nursing note reviewed.  Constitutional:      General: She is not in acute distress.    Appearance: Normal appearance. She is obese. She is not ill-appearing, toxic-appearing or diaphoretic.  HENT:     Head: Normocephalic  and atraumatic.     Right Ear: External ear normal.     Left Ear: External ear normal.     Nose: Nose normal.     Mouth/Throat:     Mouth: Mucous membranes are moist.     Pharynx: Oropharynx is clear.  Eyes:     General: No scleral icterus.       Right eye: No discharge.        Left eye: No discharge.     Extraocular Movements: Extraocular movements intact.     Conjunctiva/sclera: Conjunctivae normal.     Pupils: Pupils are equal, round, and reactive to light.  Cardiovascular:     Rate and Rhythm: Normal rate and regular rhythm.     Pulses: Normal pulses.     Heart sounds: Normal heart sounds. No murmur heard.    No friction rub. No gallop.  Pulmonary:     Effort: Pulmonary effort is normal. No respiratory distress.     Breath sounds: Normal breath sounds. No stridor. No  wheezing, rhonchi or rales.  Chest:     Chest wall: No tenderness.  Musculoskeletal:        General: Normal range of motion.     Cervical back: Normal range of motion and neck supple.  Skin:    General: Skin is warm and dry.     Capillary Refill: Capillary refill takes less than 2 seconds.     Coloration: Skin is not jaundiced or pale.     Findings: No bruising, erythema, lesion or rash.  Neurological:     General: No focal deficit present.     Mental Status: She is alert and oriented to person, place, and time. Mental status is at baseline.  Psychiatric:        Mood and Affect: Mood normal.        Behavior: Behavior normal.        Thought Content: Thought content normal.        Judgment: Judgment normal.     Results for orders placed or performed in visit on 09/27/24  CBC with Differential/Platelet   Collection Time: 09/27/24  4:34 PM  Result Value Ref Range   WBC 6.6 3.4 - 10.8 x10E3/uL   RBC 4.68 3.77 - 5.28 x10E6/uL   Hemoglobin 12.7 11.1 - 15.9 g/dL   Hematocrit 60.6 65.9 - 46.6 %   MCV 84 79 - 97 fL   MCH 27.1 26.6 - 33.0 pg   MCHC 32.3 31.5 - 35.7 g/dL   RDW 86.2 88.2 - 84.5 %   Platelets 272 150 - 450 x10E3/uL   Neutrophils 60 Not Estab. %   Lymphs 32 Not Estab. %   Monocytes 7 Not Estab. %   Eos 1 Not Estab. %   Basos 0 Not Estab. %   Neutrophils Absolute 4.0 1.4 - 7.0 x10E3/uL   Lymphocytes Absolute 2.1 0.7 - 3.1 x10E3/uL   Monocytes Absolute 0.5 0.1 - 0.9 x10E3/uL   EOS (ABSOLUTE) 0.1 0.0 - 0.4 x10E3/uL   Basophils Absolute 0.0 0.0 - 0.2 x10E3/uL   Immature Granulocytes 0 Not Estab. %   Immature Grans (Abs) 0.0 0.0 - 0.1 x10E3/uL  Comprehensive metabolic panel with GFR   Collection Time: 09/27/24  4:34 PM  Result Value Ref Range   Glucose 91 70 - 99 mg/dL   BUN 9 6 - 20 mg/dL   Creatinine, Ser 9.20 0.57 - 1.00 mg/dL   eGFR 898 >40 fO/fpw/8.26   BUN/Creatinine Ratio 11 9 -  23   Sodium 141 134 - 144 mmol/L   Potassium 4.4 3.5 - 5.2 mmol/L   Chloride  104 96 - 106 mmol/L   CO2 20 20 - 29 mmol/L   Calcium 9.5 8.7 - 10.2 mg/dL   Total Protein 6.8 6.0 - 8.5 g/dL   Albumin 4.4 3.9 - 4.9 g/dL   Globulin, Total 2.4 1.5 - 4.5 g/dL   Bilirubin Total 0.3 0.0 - 1.2 mg/dL   Alkaline Phosphatase 66 41 - 116 IU/L   AST 12 0 - 40 IU/L   ALT 9 0 - 32 IU/L  Lipid Panel w/o Chol/HDL Ratio   Collection Time: 09/27/24  4:34 PM  Result Value Ref Range   Cholesterol, Total 229 (H) 100 - 199 mg/dL   Triglycerides 883 0 - 149 mg/dL   HDL 50 >60 mg/dL   VLDL Cholesterol Cal 21 5 - 40 mg/dL   LDL Chol Calc (NIH) 841 (H) 0 - 99 mg/dL  TSH   Collection Time: 09/27/24  4:34 PM  Result Value Ref Range   TSH 1.040 0.450 - 4.500 uIU/mL      Assessment & Plan:   Problem List Items Addressed This Visit       Other   Chronic post-traumatic stress disorder (PTSD) - Primary   Doing great on her dose of her lexapro . Continue current regimen. Continue to monitor. Call with any concerns.       Relevant Medications   escitalopram  (LEXAPRO ) 10 MG tablet   Morbid obesity (HCC)   Weight up slightly from last visit. Needs to be 220-230 for surgery. Will start short course of phentermine now that mood is better. Follow up in 6 weeks. Call with any concerns.       Relevant Medications   phentermine 15 MG capsule   metFORMIN  (GLUCOPHAGE -XR) 500 MG 24 hr tablet   phentermine 15 MG capsule (Start on 11/28/2024)     Follow up plan: Return 6-8 weeks.      "

## 2024-10-28 NOTE — Assessment & Plan Note (Signed)
 Weight up slightly from last visit. Needs to be 220-230 for surgery. Will start short course of phentermine now that mood is better. Follow up in 6 weeks. Call with any concerns.

## 2024-10-28 NOTE — Assessment & Plan Note (Signed)
 Doing great on her dose of her lexapro . Continue current regimen. Continue to monitor. Call with any concerns.

## 2024-12-01 ENCOUNTER — Ambulatory Visit: Admitting: Family Medicine

## 2024-12-01 ENCOUNTER — Encounter: Payer: Self-pay | Admitting: Family Medicine

## 2024-12-01 VITALS — BP 112/76 | HR 83 | Temp 98.2°F | Ht 65.0 in | Wt 240.0 lb

## 2024-12-01 DIAGNOSIS — H66001 Acute suppurative otitis media without spontaneous rupture of ear drum, right ear: Secondary | ICD-10-CM

## 2024-12-01 DIAGNOSIS — R6889 Other general symptoms and signs: Secondary | ICD-10-CM

## 2024-12-01 LAB — VERITOR FLU A/B WAIVED
Influenza A: NEGATIVE
Influenza B: NEGATIVE

## 2024-12-01 LAB — POC COVID19 BINAXNOW: SARS Coronavirus 2 Ag: NEGATIVE

## 2024-12-01 MED ORDER — COVID-19 FLU A&B 3-IN-1 TEST VI KIT: 1.0000 | PACK | Freq: Every day | 3 refills | Status: AC | PRN

## 2024-12-01 MED ORDER — FLUCONAZOLE 150 MG PO TABS: 150.0000 mg | ORAL_TABLET | Freq: Every day | ORAL | 0 refills | Status: AC

## 2024-12-01 MED ORDER — AMOXICILLIN-POT CLAVULANATE 875-125 MG PO TABS: 1.0000 | ORAL_TABLET | Freq: Two times a day (BID) | ORAL | 0 refills | Status: AC

## 2024-12-01 NOTE — Progress Notes (Signed)
 "  BP 112/76   Pulse 83   Temp 98.2 F (36.8 C) (Oral)   Ht 5' 5 (1.651 m)   Wt 240 lb (108.9 kg)   SpO2 98%   BMI 39.94 kg/m    Subjective:    Patient ID: Alexa Diaz, female    DOB: [birth year], 34 y.o.   MRN: [id number]  HPI: Alexa Diaz is a 34 y.o. female  Chief Complaint  Patient presents with   Sore Throat    Husband tested positive for flu on 11/30/23. Symptoms started two days ago.   Fatigue   Nasal Congestion   UPPER RESPIRATORY TRACT INFECTION Duration: 2 days Worst symptom: vomiting Fever: no Cough: yes Shortness of breath: no Wheezing: no Chest pain: no Chest tightness: no Chest congestion: no Nasal congestion: yes Runny nose: yes Post nasal drip: yes Sneezing: yes Sore throat: yes Swollen glands: no Sinus pressure: no Headache: yes Face pain: no Toothache: no Ear pain: no  Ear pressure: yes bilateral Eyes red/itching:no Eye drainage/crusting: no  Vomiting: yes Rash: no Fatigue: yes Sick contacts: yes Strep contacts: no  Context: worse Recurrent sinusitis: no Relief with OTC cold/cough medications: no  Treatments attempted: none   Relevant past medical, surgical, family and social history reviewed and updated as indicated. Interim medical history since our last visit reviewed. Allergies and medications reviewed and updated.  Review of Systems  Constitutional: Negative.   HENT:  Positive for congestion and ear pain. Negative for dental problem, drooling, ear discharge, facial swelling, hearing loss, mouth sores, nosebleeds, postnasal drip, rhinorrhea, sinus pressure, sinus pain, sneezing, sore throat, tinnitus, trouble swallowing and voice change.   Respiratory: Negative.    Cardiovascular: Negative.   Gastrointestinal: Negative.   Psychiatric/Behavioral: Negative.      Per HPI unless specifically indicated above     Objective:    BP 112/76   Pulse 83   Temp 98.2 F (36.8 C) (Oral)   Ht 5' 5 (1.651 m)   Wt 240 lb  (108.9 kg)   SpO2 98%   BMI 39.94 kg/m   Wt Readings from Last 3 Encounters:  12/01/24 240 lb (108.9 kg)  10/28/24 244 lb 3.2 oz (110.8 kg)  09/27/24 238 lb (108 kg)    Physical Exam Vitals and nursing note reviewed.  Constitutional:      General: She is not in acute distress.    Appearance: Normal appearance. She is well-developed. She is obese. She is not ill-appearing, toxic-appearing or diaphoretic.  HENT:     Head: Normocephalic and atraumatic.     Right Ear: Ear canal and external ear normal. A middle ear effusion is present. Tympanic membrane is erythematous.     Left Ear: Tympanic membrane, ear canal and external ear normal. No drainage, swelling or tenderness.  No middle ear effusion. Tympanic membrane is not erythematous.     Nose: Nose normal. No congestion or rhinorrhea.     Mouth/Throat:     Mouth: Mucous membranes are moist.     Pharynx: Oropharynx is clear.  Eyes:     General: No scleral icterus.       Right eye: No discharge.        Left eye: No discharge.     Extraocular Movements: Extraocular movements intact.     Conjunctiva/sclera: Conjunctivae normal.     Pupils: Pupils are equal, round, and reactive to light.  Cardiovascular:     Rate and Rhythm: Normal rate and regular rhythm.     Pulses: Normal  pulses.     Heart sounds: Normal heart sounds. No murmur heard.    No friction rub. No gallop.  Pulmonary:     Effort: Pulmonary effort is normal. No respiratory distress.     Breath sounds: Normal breath sounds. No stridor. No wheezing, rhonchi or rales.  Chest:     Chest wall: No tenderness.  Musculoskeletal:        General: Normal range of motion.     Cervical back: Normal range of motion and neck supple.  Skin:    General: Skin is warm and dry.     Capillary Refill: Capillary refill takes less than 2 seconds.     Coloration: Skin is not jaundiced or pale.     Findings: No bruising, erythema, lesion or rash.  Neurological:     General: No focal deficit  present.     Mental Status: She is alert and oriented to person, place, and time. Mental status is at baseline.  Psychiatric:        Mood and Affect: Mood normal.        Behavior: Behavior normal.        Thought Content: Thought content normal.        Judgment: Judgment normal.     Results for orders placed or performed in visit on 12/01/24  POC COVID-19   Collection Time: 12/01/24 11:04 AM  Result Value Ref Range   SARS Coronavirus 2 Ag Negative Negative      Assessment & Plan:   Problem List Items Addressed This Visit   None Visit Diagnoses       Non-recurrent acute suppurative otitis media of right ear without spontaneous rupture of tympanic membrane    -  Primary   Will treat with augmentin . Call with any concerns. Continue to monitor.   Relevant Medications   amoxicillin -clavulanate (AUGMENTIN ) 875-125 MG tablet   fluconazole  (DIFLUCAN ) 150 MG tablet     Flu-like symptoms       Flu and COVID negative.   Relevant Orders   Influenza A & B (STAT)   POC COVID-19 (Completed)        Follow up plan: Return for As scheduled.      "

## 2024-12-16 ENCOUNTER — Ambulatory Visit: Admitting: Family Medicine
